# Patient Record
Sex: Female | Born: 1985 | Race: Black or African American | Hispanic: No | Marital: Single | Smoking: Never smoker
Health system: Southern US, Community
[De-identification: ages and names within clinical notes are randomized; demographics above are authoritative.]

## PROBLEM LIST (undated history)

## (undated) ENCOUNTER — Inpatient Hospital Stay (HOSPITAL_COMMUNITY): Payer: BC Managed Care – PPO

## (undated) ENCOUNTER — Inpatient Hospital Stay (HOSPITAL_COMMUNITY): Admission: RE | Payer: Self-pay | Source: Ambulatory Visit

## (undated) DIAGNOSIS — K648 Other hemorrhoids: Secondary | ICD-10-CM

## (undated) DIAGNOSIS — J45909 Unspecified asthma, uncomplicated: Secondary | ICD-10-CM

## (undated) DIAGNOSIS — O368130 Decreased fetal movements, third trimester, not applicable or unspecified: Secondary | ICD-10-CM

## (undated) DIAGNOSIS — E785 Hyperlipidemia, unspecified: Secondary | ICD-10-CM

## (undated) DIAGNOSIS — E119 Type 2 diabetes mellitus without complications: Secondary | ICD-10-CM

## (undated) DIAGNOSIS — G935 Compression of brain: Secondary | ICD-10-CM

## (undated) DIAGNOSIS — F909 Attention-deficit hyperactivity disorder, unspecified type: Secondary | ICD-10-CM

## (undated) DIAGNOSIS — R87629 Unspecified abnormal cytological findings in specimens from vagina: Secondary | ICD-10-CM

## (undated) DIAGNOSIS — Z789 Other specified health status: Secondary | ICD-10-CM

## (undated) DIAGNOSIS — K589 Irritable bowel syndrome without diarrhea: Secondary | ICD-10-CM

## (undated) HISTORY — DX: Compression of brain: G93.5

## (undated) HISTORY — DX: Irritable bowel syndrome, unspecified: K58.9

## (undated) HISTORY — DX: Other hemorrhoids: K64.8

## (undated) HISTORY — PX: WISDOM TOOTH EXTRACTION: SHX21

## (undated) HISTORY — DX: Attention-deficit hyperactivity disorder, unspecified type: F90.9

---

## 2006-11-25 ENCOUNTER — Emergency Department (HOSPITAL_COMMUNITY): Admission: EM | Admit: 2006-11-25 | Discharge: 2006-11-26 | Payer: Self-pay | Admitting: Emergency Medicine

## 2007-05-20 ENCOUNTER — Emergency Department (HOSPITAL_COMMUNITY): Admission: EM | Admit: 2007-05-20 | Discharge: 2007-05-20 | Payer: Self-pay | Admitting: Emergency Medicine

## 2007-05-22 ENCOUNTER — Emergency Department (HOSPITAL_COMMUNITY): Admission: EM | Admit: 2007-05-22 | Discharge: 2007-05-22 | Payer: Self-pay | Admitting: Emergency Medicine

## 2007-05-30 ENCOUNTER — Emergency Department (HOSPITAL_COMMUNITY): Admission: EM | Admit: 2007-05-30 | Discharge: 2007-05-30 | Payer: Self-pay | Admitting: Emergency Medicine

## 2007-09-11 ENCOUNTER — Emergency Department (HOSPITAL_COMMUNITY): Admission: EM | Admit: 2007-09-11 | Discharge: 2007-09-11 | Payer: Self-pay | Admitting: Family Medicine

## 2007-12-02 ENCOUNTER — Emergency Department (HOSPITAL_COMMUNITY): Admission: EM | Admit: 2007-12-02 | Discharge: 2007-12-02 | Payer: Self-pay | Admitting: Family Medicine

## 2008-02-08 ENCOUNTER — Encounter: Admission: RE | Admit: 2008-02-08 | Discharge: 2008-02-08 | Payer: Self-pay | Admitting: Family Medicine

## 2008-02-08 ENCOUNTER — Other Ambulatory Visit: Admission: RE | Admit: 2008-02-08 | Discharge: 2008-02-08 | Payer: Self-pay | Admitting: Family Medicine

## 2008-04-05 ENCOUNTER — Emergency Department (HOSPITAL_COMMUNITY): Admission: EM | Admit: 2008-04-05 | Discharge: 2008-04-05 | Payer: Self-pay | Admitting: Family Medicine

## 2008-04-13 ENCOUNTER — Emergency Department (HOSPITAL_COMMUNITY): Admission: EM | Admit: 2008-04-13 | Discharge: 2008-04-13 | Payer: Self-pay | Admitting: Family Medicine

## 2009-01-20 ENCOUNTER — Emergency Department (HOSPITAL_COMMUNITY): Admission: EM | Admit: 2009-01-20 | Discharge: 2009-01-20 | Payer: Self-pay | Admitting: Emergency Medicine

## 2009-03-31 ENCOUNTER — Emergency Department: Payer: Self-pay | Admitting: Emergency Medicine

## 2010-06-28 ENCOUNTER — Emergency Department (HOSPITAL_COMMUNITY): Admission: EM | Admit: 2010-06-28 | Discharge: 2010-06-29 | Payer: Self-pay | Admitting: Emergency Medicine

## 2011-02-09 ENCOUNTER — Inpatient Hospital Stay (INDEPENDENT_AMBULATORY_CARE_PROVIDER_SITE_OTHER)
Admission: RE | Admit: 2011-02-09 | Discharge: 2011-02-09 | Disposition: A | Discharge status: Home / Self Care | Payer: BC Managed Care – PPO | Source: Ambulatory Visit | Attending: Family Medicine | Admitting: Family Medicine

## 2011-02-09 DIAGNOSIS — N949 Unspecified condition associated with female genital organs and menstrual cycle: Secondary | ICD-10-CM

## 2011-02-09 DIAGNOSIS — N760 Acute vaginitis: Secondary | ICD-10-CM

## 2011-02-09 DIAGNOSIS — A499 Bacterial infection, unspecified: Secondary | ICD-10-CM

## 2011-02-09 LAB — CBC
Hemoglobin: 11.9 g/dL — ABNORMAL LOW (ref 12.0–15.0)
MCH: 28.5 pg (ref 26.0–34.0)
MCV: 85.6 fL (ref 78.0–100.0)
Platelets: 277 10*3/uL (ref 150–400)
RBC: 4.17 MIL/uL (ref 3.87–5.11)
WBC: 10.5 10*3/uL (ref 4.0–10.5)

## 2011-02-09 LAB — DIFFERENTIAL
Eosinophils Absolute: 0.4 10*3/uL (ref 0.0–0.7)
Lymphocytes Relative: 22 % (ref 12–46)
Lymphs Abs: 2.3 10*3/uL (ref 0.7–4.0)
Monocytes Relative: 8 % (ref 3–12)
Neutro Abs: 7 10*3/uL (ref 1.7–7.7)
Neutrophils Relative %: 66 % (ref 43–77)

## 2011-02-09 LAB — POCT URINALYSIS DIP (DEVICE)
Glucose, UA: NEGATIVE mg/dL
Hgb urine dipstick: NEGATIVE
Nitrite: NEGATIVE
Protein, ur: NEGATIVE mg/dL
Specific Gravity, Urine: 1.02 (ref 1.005–1.030)
Urobilinogen, UA: 0.2 mg/dL (ref 0.0–1.0)
pH: 7 (ref 5.0–8.0)

## 2011-02-09 LAB — WET PREP, GENITAL: WBC, Wet Prep HPF POC: NONE SEEN

## 2011-03-19 ENCOUNTER — Encounter (HOSPITAL_COMMUNITY): Payer: Self-pay

## 2011-03-19 ENCOUNTER — Encounter (HOSPITAL_COMMUNITY)
Admission: RE | Admit: 2011-03-19 | Discharge: 2011-03-19 | Disposition: A | Discharge status: Home / Self Care | Payer: BC Managed Care – PPO | Source: Ambulatory Visit | Attending: Obstetrics and Gynecology | Admitting: Obstetrics and Gynecology

## 2011-03-19 HISTORY — DX: Other specified health status: Z78.9

## 2011-03-19 LAB — SURGICAL PCR SCREEN
MRSA, PCR: INVALID — AB
Staphylococcus aureus: INVALID — AB

## 2011-03-19 NOTE — Anesthesia Preprocedure Evaluation (Signed)
Anesthesia Evaluation  Name, MR# and DOB Patient awake  General Assessment Comment  Reviewed: Allergy & Precautions, H&P , NPO status , Patient's Chart, lab work & pertinent test results, reviewed documented beta blocker date and time   Airway Mallampati: I TM Distance: >3 FB Neck ROM: full    Dental No notable dental hx. (+) Teeth Intact   Pulmonary  clear to auscultation  pulmonary exam normalPulmonary Exam Normal breath sounds clear to auscultation none    Cardiovascular regular Normal    Neuro/Psych Negative Neurological ROS  Negative Psych ROS  GI/Hepatic/Renal negative GI ROS, negative Liver ROS, and negative Renal ROS (+)       Endo/Other  Negative Endocrine ROS (+)      Abdominal Normal abdominal exam  (+)   Musculoskeletal negative musculoskeletal ROS (+)   Hematology negative hematology ROS (+)   Peds  Reproductive/Obstetrics negative OB ROS    Anesthesia Other Findings             Anesthesia Physical Anesthesia Plan  ASA: II  Anesthesia Plan: General   Post-op Pain Management:    Induction: Intravenous  Airway Management Planned: Oral ETT  Additional Equipment:   Intra-op Plan:   Post-operative Plan:   Informed Consent: I have reviewed the patients History and Physical, chart, labs and discussed the procedure including the risks, benefits and alternatives for the proposed anesthesia with the patient or authorized representative who has indicated his/her understanding and acceptance.   Dental Advisory Given and History available from chart only  Plan Discussed with: CRNA  Anesthesia Plan Comments:         Anesthesia Quick Evaluation

## 2011-03-19 NOTE — Patient Instructions (Addendum)
20 Brenda Valdez  03/19/2011   Your procedure is scheduled on:  03/24/11  Enter through the Main Entrance of [facility] at 6 AM.  Pick up the phone at the desk and dial [phone].   Call this number if you have problems the morning of surgery: [phone]   Remember:   Do not eat food:After Midnight.  Do not drink clear liquids: After Midnight.  Take these medicines the morning of surgery with A SIP OF WATER:NA   Do not wear jewelry, make-up or nail polish.  Do not wear lotions, powders, or perfumes. You may wear deodorant.  Do not shave 48 hours prior to surgery.  Do not bring valuables to the hospital.  Contacts, dentures or bridgework may not be worn into surgery.  Leave suitcase in the car. After surgery it may be brought to your room.  For patients admitted to the hospital, checkout time is 11:00 AM the day of discharge.   Patients discharged the day of surgery will not be allowed to drive home.  Name and phone number of your driver: NA  Special Instructions: CHG Shower Use Special Wash: 1/2 bottle night before surgery and 1/2 bottle morning of surgery.   Please read over the following fact sheets that you were given: MRSA Information

## 2011-03-19 NOTE — H&P (Signed)
  Brenda Valdez  DICTATION # [phone] CSN# [id number]   Meriel Pica, MD 03/19/2011 3:15 PM

## 2011-03-20 NOTE — H&P (Signed)
NAMECEIRA, HOESCHEN NO.:  [id number]  MEDICAL RECORD NO.:  [id number]  LOCATION:  [facility]                           FACILITY:  [facility]  PHYSICIAN:  Duke Salvia. Marcelle Overlie, M.D.DATE OF BIRTH:  [birth year]  DATE OF ADMISSION:  03/19/2011 DATE OF DISCHARGE:                             HISTORY & PHYSICAL   Date of scheduled surgery is March 24, 2011.  CHIEF COMPLAINT:  Pelvic pain, dysmenorrhea.  HISTORY OF PRESENT ILLNESS:  A 25 year old G0, P0, currently using condoms.  This patient has been evaluated recently in Urgent Care and also by her PCP for dysmenorrhea and pelvic pain.  She did have ultrasound ordered by her PCP done at [facility], February 12, 2011, that showed normal pelvic ultrasound.  Her exam when I saw her was likewise normal.  We discussed the possibility of endometriosis.  She had not responded well previously to ovulation suppression with OCPs.  I offered her a trial of Lupron which she declines.  She prefers to proceed with diagnostic laparoscopy to further evaluate her pelvic pain.  This procedure including risks related to bleeding, infection, adjacent organ injury, possible need for open additional surgery were all reviewed with her, which she understands and accepts.  ALLERGIES:  None.  CURRENT MEDICATIONS: 1. Doxycycline. 2. Valtrex. 3. Advil p.r.n. pain.  REVIEW OF SYSTEMS:  Significant for history of mild asthma, not currently on medications, prior history of herpes, UTI.  FAMILY HISTORY:  Significant for asthma, osteoporosis, diverticulosis, arthritis, diabetes, hypertension, and pancreatic, throat and lung cancer in unspecified relatives  PAST SURGERIES:  None.  SOCIAL HISTORY:  Denies tobacco or drug use.  She does say yes to alcohol use, amount not specified.  Dr. Duanne Guess at [facility] is her PCP.  PHYSICAL EXAMINATION:  VITAL SIGNS:  Temperature 98.2, blood pressure 120/72. HEENT: Unremarkable. NECK:  Supple without  mass. LUNGS:  Clear. CARDIOVASCULAR:  Rate and rhythm without murmurs, rubs, gallops. BREASTS:  Without masses. ABDOMEN:  Soft, flat, nontender. PELVIC:  Normal external genitalia.  Vagina and cervix clear.  Uterus mid position, nontender.  Adnexa unremarkable.  No unusual nodularity or tenderness. EXTREMITIES/NEUROLOGIC:  Unremarkable.  IMPRESSION:  Severe dysmenorrhea.  PLAN:  Diagnostic laparoscopy to rule out endometriosis or other causes. Procedural risks reviewed as above.     Xeng Kucher M. Marcelle Overlie, M.D.     RM[facility]/MEDQ  D:  03/19/2011  T:  03/20/2011  Job:  [id number]

## 2011-03-22 LAB — MRSA CULTURE

## 2011-03-24 ENCOUNTER — Ambulatory Visit (HOSPITAL_COMMUNITY)
Admission: RE | Admit: 2011-03-24 | Discharge: 2011-03-24 | Disposition: A | Discharge status: Home / Self Care | Payer: BC Managed Care – PPO | Source: Ambulatory Visit | Attending: Obstetrics and Gynecology | Admitting: Obstetrics and Gynecology

## 2011-03-24 ENCOUNTER — Encounter (HOSPITAL_COMMUNITY): Payer: Self-pay | Admitting: Anesthesiology

## 2011-03-24 ENCOUNTER — Encounter (HOSPITAL_COMMUNITY)
Admission: RE | Disposition: A | Discharge status: Home / Self Care | Payer: Self-pay | Source: Ambulatory Visit | Attending: Obstetrics and Gynecology

## 2011-03-24 ENCOUNTER — Ambulatory Visit (HOSPITAL_COMMUNITY): Payer: BC Managed Care – PPO | Admitting: Anesthesiology

## 2011-03-24 DIAGNOSIS — N949 Unspecified condition associated with female genital organs and menstrual cycle: Secondary | ICD-10-CM | POA: Insufficient documentation

## 2011-03-24 HISTORY — PX: LAPAROSCOPY: SHX197

## 2011-03-24 LAB — SURGICAL PCR SCREEN: MRSA, PCR: NEGATIVE

## 2011-03-24 LAB — CBC
Hemoglobin: 11.6 g/dL — ABNORMAL LOW (ref 12.0–15.0)
MCV: 87.5 fL (ref 78.0–100.0)
Platelets: 244 10*3/uL (ref 150–400)
RBC: 4.08 MIL/uL (ref 3.87–5.11)
WBC: 7.3 10*3/uL (ref 4.0–10.5)

## 2011-03-24 SURGERY — LAPAROSCOPY, DIAGNOSTIC
Anesthesia: General | Site: Abdomen | Wound class: Clean Contaminated

## 2011-03-24 MED ORDER — BUPIVACAINE HCL (PF) 0.25 % IJ SOLN
INTRAMUSCULAR | Status: DC | PRN
  Administered 2011-03-24: 9 mL

## 2011-03-24 MED ORDER — FENTANYL CITRATE 0.05 MG/ML IJ SOLN
INTRAMUSCULAR | Status: AC
  Filled 2011-03-24: qty 5

## 2011-03-24 MED ORDER — KETOROLAC TROMETHAMINE 30 MG/ML IJ SOLN
INTRAMUSCULAR | Status: DC | PRN
  Administered 2011-03-24: 30 mg via INTRAVENOUS

## 2011-03-24 MED ORDER — HYDROCODONE-ACETAMINOPHEN 5-325 MG PO TABS
ORAL_TABLET | ORAL | Status: AC
  Filled 2011-03-24: qty 1

## 2011-03-24 MED ORDER — ONDANSETRON HCL 4 MG/2ML IJ SOLN
INTRAMUSCULAR | Status: DC | PRN
  Administered 2011-03-24: 4 mg via INTRAVENOUS

## 2011-03-24 MED ORDER — METOCLOPRAMIDE HCL 5 MG/ML IJ SOLN: 10.0000 mg | Freq: Once | INTRAMUSCULAR | Status: DC | PRN

## 2011-03-24 MED ORDER — NEOSTIGMINE METHYLSULFATE 1 MG/ML IJ SOLN
INTRAMUSCULAR | Status: AC
  Filled 2011-03-24: qty 10

## 2011-03-24 MED ORDER — LIDOCAINE HCL (CARDIAC) 20 MG/ML IV SOLN
INTRAVENOUS | Status: DC | PRN
  Administered 2011-03-24: 60 mg via INTRAVENOUS

## 2011-03-24 MED ORDER — DEXTROSE IN LACTATED RINGERS 5 % IV SOLN: INTRAVENOUS | Status: DC

## 2011-03-24 MED ORDER — LIDOCAINE HCL (CARDIAC) 20 MG/ML IV SOLN
INTRAVENOUS | Status: AC
  Filled 2011-03-24: qty 5

## 2011-03-24 MED ORDER — MIDAZOLAM HCL 5 MG/5ML IJ SOLN
INTRAMUSCULAR | Status: DC | PRN
  Administered 2011-03-24: 2 mg via INTRAVENOUS

## 2011-03-24 MED ORDER — DEXAMETHASONE SODIUM PHOSPHATE 10 MG/ML IJ SOLN
INTRAMUSCULAR | Status: AC
  Filled 2011-03-24: qty 1

## 2011-03-24 MED ORDER — FENTANYL CITRATE 0.05 MG/ML IJ SOLN: 25.0000 ug | INTRAMUSCULAR | Status: DC | PRN

## 2011-03-24 MED ORDER — GLYCOPYRROLATE 0.2 MG/ML IJ SOLN
INTRAMUSCULAR | Status: DC | PRN
  Administered 2011-03-24: .6 mg via INTRAVENOUS

## 2011-03-24 MED ORDER — HYDROCODONE-ACETAMINOPHEN 5-325 MG PO TABS
1.0000 | ORAL_TABLET | Freq: Once | ORAL | Status: AC
  Administered 2011-03-24: 1 via ORAL

## 2011-03-24 MED ORDER — NEOSTIGMINE METHYLSULFATE 1 MG/ML IJ SOLN
INTRAMUSCULAR | Status: DC | PRN
  Administered 2011-03-24: 3 mg via INTRAMUSCULAR

## 2011-03-24 MED ORDER — FENTANYL CITRATE 0.05 MG/ML IJ SOLN
INTRAMUSCULAR | Status: DC | PRN
  Administered 2011-03-24 (×2): 100 ug via INTRAVENOUS
  Administered 2011-03-24: 50 ug via INTRAVENOUS

## 2011-03-24 MED ORDER — KETOROLAC TROMETHAMINE 30 MG/ML IJ SOLN
INTRAMUSCULAR | Status: AC
  Filled 2011-03-24: qty 1

## 2011-03-24 MED ORDER — ONDANSETRON HCL 4 MG/2ML IJ SOLN
INTRAMUSCULAR | Status: AC
  Filled 2011-03-24: qty 2

## 2011-03-24 MED ORDER — LACTATED RINGERS IV SOLN
INTRAVENOUS | Status: DC
  Administered 2011-03-24 (×2): via INTRAVENOUS

## 2011-03-24 MED ORDER — DEXAMETHASONE SODIUM PHOSPHATE 10 MG/ML IJ SOLN
INTRAMUSCULAR | Status: DC | PRN
  Administered 2011-03-24: 10 mg via INTRAVENOUS

## 2011-03-24 MED ORDER — PROPOFOL 10 MG/ML IV EMUL
INTRAVENOUS | Status: DC | PRN
  Administered 2011-03-24: 150 mg via INTRAVENOUS

## 2011-03-24 MED ORDER — MIDAZOLAM HCL 2 MG/2ML IJ SOLN
INTRAMUSCULAR | Status: AC
  Filled 2011-03-24: qty 2

## 2011-03-24 MED ORDER — ROCURONIUM BROMIDE 100 MG/10ML IV SOLN
INTRAVENOUS | Status: DC | PRN
  Administered 2011-03-24: 25 mg via INTRAVENOUS

## 2011-03-24 MED ORDER — PROPOFOL 10 MG/ML IV EMUL
INTRAVENOUS | Status: AC
  Filled 2011-03-24: qty 20

## 2011-03-24 MED ORDER — GLYCOPYRROLATE 0.2 MG/ML IJ SOLN
INTRAMUSCULAR | Status: AC
  Filled 2011-03-24: qty 2

## 2011-03-24 MED ORDER — ROCURONIUM BROMIDE 50 MG/5ML IV SOLN
INTRAVENOUS | Status: AC
  Filled 2011-03-24: qty 1

## 2011-03-24 SURGICAL SUPPLY — 16 items
CABLE HIGH FREQUENCY MONO STRZ (ELECTRODE) IMPLANT
CATH ROBINSON RED A/P 16FR (CATHETERS) ×2 IMPLANT
CLOTH BEACON ORANGE TIMEOUT ST (SAFETY) ×2 IMPLANT
DERMABOND ADVANCED (GAUZE/BANDAGES/DRESSINGS) ×2 IMPLANT
GLOVE BIO SURGEON STRL SZ7 (GLOVE) ×4 IMPLANT
GOWN PREVENTION PLUS LG XLONG (DISPOSABLE) ×4 IMPLANT
NEEDLE INSUFFLATION 14GA 120MM (NEEDLE) ×2 IMPLANT
NS IRRIG 1000ML POUR BTL (IV SOLUTION) ×2 IMPLANT
PACK LAPAROSCOPY BASIN (CUSTOM PROCEDURE TRAY) ×2 IMPLANT
SET IRRIG TUBING LAPAROSCOPIC (IRRIGATION / IRRIGATOR) IMPLANT
SUT VICRYL RAPIDE 4/0 PS 2 (SUTURE) ×4 IMPLANT
TOWEL OR 17X24 6PK STRL BLUE (TOWEL DISPOSABLE) ×4 IMPLANT
TROCAR Z-THREAD BLADED 11X100M (TROCAR) ×2 IMPLANT
TROCAR Z-THREAD BLADED 5X100MM (TROCAR) ×4 IMPLANT
WARMER LAPAROSCOPE (MISCELLANEOUS) ×2 IMPLANT
WATER STERILE IRR 1000ML POUR (IV SOLUTION) ×2 IMPLANT

## 2011-03-24 NOTE — Anesthesia Postprocedure Evaluation (Signed)
  Anesthesia Post-op Note  Patient: Brenda Valdez  Procedure(s) Performed:  LAPAROSCOPY DIAGNOSTIC  Patient Location: PACU  Anesthesia Type: General  Level of Consciousness: awake, alert  and oriented  Airway and Oxygen Therapy: Patient Spontanous Breathing  Post-op Pain: none  Post-op Assessment: Post-op Vital signs reviewed, Patient's Cardiovascular Status Stable, Respiratory Function Stable, Patent Airway, No signs of Nausea or vomiting and Pain level controlled  Post-op Vital Signs: Reviewed and stable  Complications: No apparent anesthesia complications

## 2011-03-24 NOTE — Op Note (Signed)
Preoperative diagnosis: Chronic pelvic pain  Postoperative diagnosis: Same  Procedure: Diagnostic laparoscopy  Surgeon: Marcelle Overlie  EBL: Minimal  Specimens removed: None  Complications: None  Procedure and findings:  Patient to the operating room after an adequate level of general anesthesia was obtained with the patient's legs in stirrups the abdomen perineum and vagina were prepped and draped in usual manner for diagnostic laparoscopy. The bladder was drained he UA carried out uterus mid position normal size mobile adnexa negative. A Conn cannula was positioned on the uterus. Attention directed to the abdomen where the subumbilical area was infiltrated with quarter percent Marcaine plain small incision was made in the varies needle was introduced without difficulty. Its intra-abdominal position was verified by pressure water testing. After 2-1/2 L pneumoperitoneum syncopated lap scopic trocar and sleeve were then introduced without difficulty. There is no evidence of any bleeding or trauma 3 finger breaths above the symphysis in the midline a 5 mm trocar was inserted under direct visualization. With the patient in Trendelenburg and the uterus anteflex, the pelvic findings as follows  The anterior posterior cul-de-sac were unremarkable the uterus itself was normal size serosa. The normal bilateral adnexa unremarkable pelvic sidewalls were normal careful inspection for early stage endometriosis was negative. Course of the ureter on each side was unremarkable. Upper abdomen was normal the appendix could be well visualized did have some periappendiceal adhesions but no evidence of any inflammation. After these findings for further documented in sutures removed gas allowed to escape because closed with 4 Dexon subcuticular at the umbilicus and Dermabond on the lower incision she tolerated this well went to recovery room in good condition.  Dictated with [facility], not proofread Jonavon Trieu M. Milana Obey.D.

## 2011-03-24 NOTE — Progress Notes (Signed)
No change in status.

## 2011-03-24 NOTE — Transfer of Care (Signed)
Immediate Anesthesia Transfer of Care Note  Patient: Brenda Valdez  Procedure(s) Performed:  LAPAROSCOPY DIAGNOSTIC  Patient Location: PACU  Anesthesia Type: General  Level of Consciousness: awake, alert  and oriented  Airway & Oxygen Therapy: Patient Spontanous Breathing and Patient connected to nasal cannula oxygen  Post-op Assessment: Report given to PACU RN, Post -op Vital signs reviewed and stable and Patient moving all extremities  Post vital signs: Reviewed and stable  Complications: No apparent anesthesia complications

## 2011-04-13 ENCOUNTER — Encounter (HOSPITAL_COMMUNITY): Payer: Self-pay | Admitting: Obstetrics and Gynecology

## 2011-04-24 LAB — POCT URINALYSIS DIP (DEVICE)
Glucose, UA: NEGATIVE
Nitrite: NEGATIVE
Operator id: 235561
Urobilinogen, UA: 0.2

## 2011-04-24 LAB — I-STAT 8, (EC8 V) (CONVERTED LAB)
Bicarbonate: 25.3 — ABNORMAL HIGH
HCT: 41
Hemoglobin: 13.9
Operator id: 235561
Sodium: 139
TCO2: 27

## 2011-04-24 LAB — POCT PREGNANCY, URINE: Preg Test, Ur: NEGATIVE

## 2011-05-13 LAB — INFLUENZA A AND B ANTIGEN (CONVERTED LAB)
Inflenza A Ag: NEGATIVE
Influenza B Ag: NEGATIVE

## 2011-05-13 LAB — CULTURE, ROUTINE-ABSCESS

## 2012-08-06 ENCOUNTER — Encounter (HOSPITAL_COMMUNITY): Payer: Self-pay | Admitting: Pharmacy Technician

## 2012-08-09 ENCOUNTER — Other Ambulatory Visit (HOSPITAL_COMMUNITY): Payer: Self-pay | Admitting: Obstetrics and Gynecology

## 2012-08-09 MED ORDER — ACETAMINOPHEN 10 MG/ML IV SOLN: 1000.0000 mg | Freq: Once | INTRAVENOUS | Status: AC

## 2012-08-09 NOTE — H&P (Signed)
Brenda Valdez  DICTATION # [id number] CSN# [id number]   Meriel Pica, MD 08/09/2012 9:23 AM

## 2012-08-10 NOTE — H&P (Signed)
Brenda Valdez, RIDER NO.:  [id number]  MEDICAL RECORD NO.:  [id number]  LOCATION:  [facility]IO                         FACILITY:  [facility]  PHYSICIAN:  Duke Salvia. Marcelle Overlie, M.D.DATE OF BIRTH:  [birth year]  DATE OF ADMISSION:  08/11/2012 DATE OF DISCHARGE:                             HISTORY & PHYSICAL   CHIEF COMPLAINT:  Cervical dysplasia.  HISTORY OF PRESENT ILLNESS:  A 27 year old G0 P0, currently using the NuvaRing for contraception.  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  Pap 06/24/2012, showed high grade dysplasia encompassing moderate and severe dysplasia.  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  Associated chronic cervicitis.  Because of her young age, we discussed observation for CIN 2 knowing that some of those will regress versus loop excision.  She is opted for loop excision, perforating the on patient's setting for her comfort.  This procedure including specific risks related to bleeding, infection, the need for follow up. Implications on future fertility reviewed with her which she understands and accepts.  PAST MEDICAL HISTORY:  She has had a diagnostic laparoscopy 2012 for pelvic pain with normal findings.  CURRENT MEDICATIONS:  NuvaRing.  REVIEW OF SYSTEMS:  Significant for history of abnormal Pap.  She has had all 3 of her Gardasil series.  History of mild asthma, anemia.  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 Valtrex and UTI.  FAMILY HISTORY:  Significant for osteoporosis, diverticulosis, arthritis, diabetes, hypertension, and a family history of both road and pancreatic cancer.  SOCIAL HISTORY:  She is single.  Denies tobacco or drug use.  She does admit to social alcohol use.  [facility] is her PCP.  PHYSICAL EXAMINATION:  VITAL SIGNS:  Temp 98.2, blood pressure 120/72. HEENT:  Unremarkable. NECK:  Supple  without masses. LUNGS:  Clear. CARDIOVASCULAR:  Regular rate and rhythm without murmurs, rubs, or gallops noted. BREASTS:  Without masses. ABDOMEN:  Soft, flat, and nontender. PELVIC:  Normal external genitalia.  Vagina and cervix clear.  Uterus, mid positional size.  Adnexa negative. EXTREMITIES:  Unremarkable. NEUROLOGIC:  Unremarkable.  IMPRESSION:  Moderate dysplasia.  PLAN:  Loop excision.  Procedure and risks discussed as above.     Hurshel Bouillon M. Marcelle Overlie, M.D.     RM[facility]/MEDQ  D:  08/09/2012  T:  08/10/2012  Job:  [id number]

## 2012-08-11 ENCOUNTER — Ambulatory Visit (HOSPITAL_COMMUNITY)
Admission: RE | Admit: 2012-08-11 | Discharge: 2012-08-11 | Disposition: A | Discharge status: Home / Self Care | Payer: 59 | Source: Ambulatory Visit | Attending: Obstetrics and Gynecology | Admitting: Obstetrics and Gynecology

## 2012-08-11 ENCOUNTER — Encounter (HOSPITAL_COMMUNITY)
Admission: RE | Disposition: A | Discharge status: Home / Self Care | Payer: Self-pay | Source: Ambulatory Visit | Attending: Obstetrics and Gynecology

## 2012-08-11 ENCOUNTER — Ambulatory Visit (HOSPITAL_COMMUNITY): Payer: 59 | Admitting: Anesthesiology

## 2012-08-11 ENCOUNTER — Encounter (HOSPITAL_COMMUNITY): Payer: Self-pay | Admitting: Anesthesiology

## 2012-08-11 DIAGNOSIS — N871 Moderate cervical dysplasia: Secondary | ICD-10-CM | POA: Insufficient documentation

## 2012-08-11 HISTORY — PX: LEEP: SHX91

## 2012-08-11 LAB — CBC
Hemoglobin: 12 g/dL (ref 12.0–15.0)
MCH: 28.4 pg (ref 26.0–34.0)
MCHC: 32.8 g/dL (ref 30.0–36.0)
RDW: 12.4 % (ref 11.5–15.5)

## 2012-08-11 LAB — PREGNANCY, URINE: Preg Test, Ur: NEGATIVE

## 2012-08-11 SURGERY — LEEP (LOOP ELECTROSURGICAL EXCISION PROCEDURE)
Anesthesia: Monitor Anesthesia Care | Site: Vagina | Wound class: Clean Contaminated

## 2012-08-11 MED ORDER — DEXAMETHASONE SODIUM PHOSPHATE 10 MG/ML IJ SOLN
INTRAMUSCULAR | Status: AC
  Filled 2012-08-11: qty 1

## 2012-08-11 MED ORDER — MIDAZOLAM HCL 2 MG/2ML IJ SOLN: 0.5000 mg | Freq: Once | INTRAMUSCULAR | Status: DC | PRN

## 2012-08-11 MED ORDER — PROPOFOL 10 MG/ML IV EMUL
INTRAVENOUS | Status: DC | PRN
  Administered 2012-08-11: 200 ug/kg/min via INTRAVENOUS

## 2012-08-11 MED ORDER — MIDAZOLAM HCL 5 MG/5ML IJ SOLN
INTRAMUSCULAR | Status: DC | PRN
  Administered 2012-08-11: 2 mg via INTRAVENOUS

## 2012-08-11 MED ORDER — LIDOCAINE HCL (CARDIAC) 20 MG/ML IV SOLN
INTRAVENOUS | Status: AC
  Filled 2012-08-11: qty 5

## 2012-08-11 MED ORDER — MEPERIDINE HCL 25 MG/ML IJ SOLN: 6.2500 mg | INTRAMUSCULAR | Status: DC | PRN

## 2012-08-11 MED ORDER — FENTANYL CITRATE 0.05 MG/ML IJ SOLN
INTRAMUSCULAR | Status: DC | PRN
  Administered 2012-08-11: 50 ug via INTRAVENOUS

## 2012-08-11 MED ORDER — ONDANSETRON HCL 4 MG/2ML IJ SOLN
INTRAMUSCULAR | Status: DC | PRN
  Administered 2012-08-11: 4 mg via INTRAVENOUS

## 2012-08-11 MED ORDER — LACTATED RINGERS IV SOLN
INTRAVENOUS | Status: DC
  Administered 2012-08-11: 50 mL/h via INTRAVENOUS
  Administered 2012-08-11 (×2): via INTRAVENOUS

## 2012-08-11 MED ORDER — LIDOCAINE-EPINEPHRINE 1 %-1:100000 IJ SOLN
INTRAMUSCULAR | Status: DC | PRN
  Administered 2012-08-11: 5 mL

## 2012-08-11 MED ORDER — PROMETHAZINE HCL 25 MG/ML IJ SOLN: 6.2500 mg | INTRAMUSCULAR | Status: DC | PRN

## 2012-08-11 MED ORDER — KETOROLAC TROMETHAMINE 30 MG/ML IJ SOLN
INTRAMUSCULAR | Status: DC | PRN
  Administered 2012-08-11: 30 mg via INTRAVENOUS

## 2012-08-11 MED ORDER — ONDANSETRON HCL 4 MG/2ML IJ SOLN
INTRAMUSCULAR | Status: AC
  Filled 2012-08-11: qty 2

## 2012-08-11 MED ORDER — FENTANYL CITRATE 0.05 MG/ML IJ SOLN
INTRAMUSCULAR | Status: AC
  Filled 2012-08-11: qty 2

## 2012-08-11 MED ORDER — LIDOCAINE HCL (CARDIAC) 20 MG/ML IV SOLN
INTRAVENOUS | Status: DC | PRN
  Administered 2012-08-11: 20 mg via INTRAVENOUS

## 2012-08-11 MED ORDER — MIDAZOLAM HCL 2 MG/2ML IJ SOLN
INTRAMUSCULAR | Status: AC
  Filled 2012-08-11: qty 2

## 2012-08-11 MED ORDER — PROPOFOL 10 MG/ML IV EMUL
INTRAVENOUS | Status: AC
  Filled 2012-08-11: qty 20

## 2012-08-11 MED ORDER — KETOROLAC TROMETHAMINE 30 MG/ML IJ SOLN: 15.0000 mg | Freq: Once | INTRAMUSCULAR | Status: DC | PRN

## 2012-08-11 MED ORDER — KETOROLAC TROMETHAMINE 30 MG/ML IJ SOLN
INTRAMUSCULAR | Status: AC
  Filled 2012-08-11: qty 1

## 2012-08-11 MED ORDER — FERRIC SUBSULFATE SOLN
Status: DC | PRN
  Administered 2012-08-11: 1 via TOPICAL

## 2012-08-11 MED ORDER — FENTANYL CITRATE 0.05 MG/ML IJ SOLN: 25.0000 ug | INTRAMUSCULAR | Status: DC | PRN

## 2012-08-11 MED ORDER — ACETIC ACID 4% SOLUTION
Status: DC | PRN
  Administered 2012-08-11: 1 via TOPICAL

## 2012-08-11 SURGICAL SUPPLY — 27 items
APPLICATOR COTTON TIP 6IN STRL (MISCELLANEOUS) ×2 IMPLANT
CLOTH BEACON ORANGE TIMEOUT ST (SAFETY) ×2 IMPLANT
CONT SPECI 4OZ STER CLIK (MISCELLANEOUS) ×2 IMPLANT
DRESSING TELFA 8X3 (GAUZE/BANDAGES/DRESSINGS) ×2 IMPLANT
ELECT BALL LEEP 5MM RED (ELECTRODE) ×2 IMPLANT
ELECT LOOP LEEP RND 15X12 GRN (CUTTING LOOP) ×2
ELECT LOOP LEEP RND 20X12 WHT (CUTTING LOOP)
ELECT REM PT RETURN 9FT ADLT (ELECTROSURGICAL) ×2
ELECTRODE LOOP LP RND 15X12GRN (CUTTING LOOP) ×1 IMPLANT
ELECTRODE LOOP LP RND 20X12WHT (CUTTING LOOP) IMPLANT
ELECTRODE REM PT RTRN 9FT ADLT (ELECTROSURGICAL) ×1 IMPLANT
EVACUATOR PREFILTER SMOKE (MISCELLANEOUS) ×2 IMPLANT
GAUZE SPONGE 4X4 16PLY XRAY LF (GAUZE/BANDAGES/DRESSINGS) IMPLANT
GLOVE BIO SURGEON STRL SZ7 (GLOVE) ×4 IMPLANT
GOWN STRL REIN XL XLG (GOWN DISPOSABLE) ×6 IMPLANT
HOSE NS SMOKE EVAC 7/8 X6 (MISCELLANEOUS) ×2 IMPLANT
NEEDLE SPNL 22GX3.5 QUINCKE BK (NEEDLE) ×2 IMPLANT
NS IRRIG 1000ML POUR BTL (IV SOLUTION) IMPLANT
PAD OB MATERNITY 4.3X12.25 (PERSONAL CARE ITEMS) ×2 IMPLANT
PENCIL BUTTON HOLSTER BLD 10FT (ELECTRODE) ×2 IMPLANT
REDUCER FITTING SMOKE EVAC (MISCELLANEOUS) ×2 IMPLANT
SCOPETTES 8  STERILE (MISCELLANEOUS) ×1
SCOPETTES 8 STERILE (MISCELLANEOUS) ×1 IMPLANT
SYR CONTROL 10ML LL (SYRINGE) ×2 IMPLANT
TOWEL OR 17X24 6PK STRL BLUE (TOWEL DISPOSABLE) ×4 IMPLANT
TUBING SMOKE EVAC HOSE ADAPTER (MISCELLANEOUS) ×2 IMPLANT
WATER STERILE IRR 1000ML POUR (IV SOLUTION) ×2 IMPLANT

## 2012-08-11 NOTE — Anesthesia Preprocedure Evaluation (Addendum)
Anesthesia Evaluation  Patient identified by MRN, date of birth, ID band Patient awake    Reviewed: Allergy & Precautions, H&P , Patient's Chart, lab work & pertinent test results, reviewed documented beta blocker date and time   History of Anesthesia Complications Negative for: history of anesthetic complications  Airway Mallampati: II TM Distance: >3 FB Neck ROM: full    Dental No notable dental hx.    Pulmonary neg pulmonary ROS,  breath sounds clear to auscultation  Pulmonary exam normal       Cardiovascular Exercise Tolerance: Good negative cardio ROS  Rhythm:regular Rate:Normal     Neuro/Psych negative neurological ROS  negative psych ROS   GI/Hepatic negative GI ROS, Neg liver ROS,   Endo/Other  negative endocrine ROS  Renal/GU negative Renal ROS     Musculoskeletal   Abdominal   Peds  Hematology negative hematology ROS (+)   Anesthesia Other Findings   Reproductive/Obstetrics negative OB ROS                           Anesthesia Physical Anesthesia Plan  ASA: I  Anesthesia Plan: MAC   Post-op Pain Management:    Induction:   Airway Management Planned:   Additional Equipment:   Intra-op Plan:   Post-operative Plan:   Informed Consent: I have reviewed the patients History and Physical, chart, labs and discussed the procedure including the risks, benefits and alternatives for the proposed anesthesia with the patient or authorized representative who has indicated his/her understanding and acceptance.   Dental Advisory Given  Plan Discussed with: CRNA, Surgeon and Anesthesiologist  Anesthesia Plan Comments:       Anesthesia Quick Evaluation

## 2012-08-11 NOTE — Anesthesia Postprocedure Evaluation (Signed)
Anesthesia Post Note  Patient: Brenda Valdez  Procedure(s) Performed: Procedure(s) (LRB): LOOP ELECTROSURGICAL EXCISION PROCEDURE (LEEP) (N/A)  Anesthesia type: MAC  Patient location: PACU  Post pain: Pain level controlled  Post assessment: Post-op Vital signs reviewed  Last Vitals:  Filed Vitals:   08/11/12 1330  BP: 112/62  Pulse: 81  Temp: 37.1 C  Resp: 20    Post vital signs: Reviewed  Level of consciousness: sedated  Complications: No apparent anesthesia complications

## 2012-08-11 NOTE — Transfer of Care (Signed)
Immediate Anesthesia Transfer of Care Note  Patient: Brenda Valdez  Procedure(s) Performed: Procedure(s) (LRB) with comments: LOOP ELECTROSURGICAL EXCISION PROCEDURE (LEEP) (N/A)  Patient Location: PACU  Anesthesia Type:MAC  Level of Consciousness: awake  Airway & Oxygen Therapy: Patient Spontanous Breathing and Patient connected to nasal cannula oxygen  Post-op Assessment: Report given to PACU RN and Post -op Vital signs reviewed and stable  Post vital signs: stable  Complications: No apparent anesthesia complications

## 2012-08-11 NOTE — Progress Notes (Signed)
The patient was re-examined with no change in status 

## 2012-08-11 NOTE — Op Note (Signed)
Preoperative diagnosis: Moderate dysplasia  Postoperative diagnosis: Same  Procedure: Colposcopy, to pass loop excision procedure  Surgeon: Marcelle Overlie  Complications: None  Specimens removed ectocervical biopsies, LEEP specimen to pathology  EBL: Less than 10 cc  Procedure and findings  Patient taken the operating room after an adequate level of general mask anesthesia was obtained with the patient's legs in stirrups the vagina was prepped and draped in the usual fashion for vaginal procedures the colposcope was then brought into place, appropriate timeout for taken at that point. Repeat colposcopy revealed some acetowhite areas as noted previously on office colposcopy. Appropriate loop electrode was selected. 1% Xylocaine with epinephrine was then used intracervically in a circumferential manner. 2 pass LEEP was then performed of one half moon from the anterior cervix and one from the posterior cervix this is hemostatic ball tip electrode used to coagulate the base Monsel's applied she tolerated this well went to recovery room in good condition.  Dictated with [facility]  Brenda Valdez M. Brenda Valdez.D.

## 2012-08-12 ENCOUNTER — Encounter (HOSPITAL_COMMUNITY): Payer: Self-pay | Admitting: Obstetrics and Gynecology

## 2013-04-27 ENCOUNTER — Other Ambulatory Visit: Payer: Self-pay | Admitting: Obstetrics and Gynecology

## 2013-05-24 ENCOUNTER — Encounter (HOSPITAL_COMMUNITY): Payer: Self-pay | Admitting: *Deleted

## 2013-06-05 NOTE — H&P (Signed)
[name]  DICTATION # [phone] CSN# [id number]   Meriel Pica, MD 06/05/2013 1:33 PM

## 2013-06-06 NOTE — H&P (Signed)
[name], [name] NO.:  [id number]  MEDICAL RECORD NO.:  [id number]  LOCATION:                                 FACILITY:  PHYSICIAN:  Brenda Valdez. Brenda Valdez, Brenda ValdezDATE OF BIRTH:  06/25/1986  DATE OF ADMISSION: DATE OF DISCHARGE:                             HISTORY & PHYSICAL   CHIEF COMPLAINT:  Recurrent cervical/vaginal dysplasia.  HISTORY OF PRESENT ILLNESS:  A 27 year old, G0, P0, currently using the NuvaRing for contraception.  This patient underwent LEEP, January 2014. The pathology demonstrating cervix negative for dysplasia on the anterior lip, posterior lip showed negative for dysplasia with chronic inflammation.  Followup Pap on August 2014, demonstrated high-grade dysplasia, positive high-risk HPV.  Repeat colposcopy demonstrated some VAIN 1 on the posterior fornix area.  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  ECC was negative.  She presents now for laser ablation of the cervix and VAIN in the posterior fornix.  This procedure including specific risks related to bleeding, infection, the need for careful cytology followup, other complications, may require additional surgery discussed with her, which she understands and accepts.  PAST MEDICAL HISTORY:  Diagnostic laparoscopy in 2012 for pelvic pain with normal findings.  CURRENT MEDICATIONS:  NuvaRing.  REVIEW OF SYSTEMS:  Significant for abnormal cytology, also history of mild asthma and anemia and history of HSV for which she takes p.r.n. Valtrex.  FAMILY HISTORY:  Significant for osteoporosis, diverticulosis, arthritis, diabetes, hypertension, and pancreatic cancer.  SOCIAL HISTORY:  She is single.  Denies tobacco or drug use, drinks socially.  PHYSICAL EXAMINATION:  VITAL SIGNS:  Temperature 98.2, blood pressure 120/72. HEENT:  Unremarkable. NECK:  Supple without masses. LUNGS:  Clear. CARDIOVASCULAR:  Regular  rate and rhythm without murmurs, rubs, or gallops. BREASTS:  Without masses. ABDOMEN:  Soft, flat, nontender. GENITOURINARY:  Vulva, vagina, cervix normal.  Uterus mid position, normal size.  Adnexa negative. EXTREMITIES:  Unremarkable. NEUROLOGIC:  Unremarkable.  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  PLAN:  Laser ablation of CIN 1 and VAIN 1/2.  Procedure and risks discussed as above.     Brenda Valdez, M.D.     RM[facility]/MEDQ  D:  06/05/2013  T:  06/06/2013  Job:  [id number]

## 2013-06-16 ENCOUNTER — Encounter (HOSPITAL_COMMUNITY)
Admission: RE | Disposition: A | Discharge status: Home / Self Care | Payer: Self-pay | Source: Ambulatory Visit | Attending: Obstetrics and Gynecology

## 2013-06-16 ENCOUNTER — Ambulatory Visit (HOSPITAL_COMMUNITY)
Admission: RE | Admit: 2013-06-16 | Discharge: 2013-06-16 | Disposition: A | Discharge status: Home / Self Care | Payer: 59 | Source: Ambulatory Visit | Attending: Obstetrics and Gynecology | Admitting: Obstetrics and Gynecology

## 2013-06-16 ENCOUNTER — Encounter (HOSPITAL_COMMUNITY): Payer: Self-pay | Admitting: *Deleted

## 2013-06-16 ENCOUNTER — Encounter (HOSPITAL_COMMUNITY): Payer: 59 | Admitting: Anesthesiology

## 2013-06-16 ENCOUNTER — Ambulatory Visit (HOSPITAL_COMMUNITY): Payer: 59 | Admitting: Anesthesiology

## 2013-06-16 DIAGNOSIS — N893 Dysplasia of vagina, unspecified: Secondary | ICD-10-CM | POA: Insufficient documentation

## 2013-06-16 DIAGNOSIS — N870 Mild cervical dysplasia: Secondary | ICD-10-CM | POA: Insufficient documentation

## 2013-06-16 HISTORY — DX: Unspecified asthma, uncomplicated: J45.909

## 2013-06-16 HISTORY — PX: LASER ABLATION CONDOLAMATA: SHX5941

## 2013-06-16 LAB — CBC
Hemoglobin: 11.9 g/dL — ABNORMAL LOW (ref 12.0–15.0)
MCH: 27.2 pg (ref 26.0–34.0)
MCHC: 33.1 g/dL (ref 30.0–36.0)
MCV: 82.4 fL (ref 78.0–100.0)
Platelets: 264 10*3/uL (ref 150–400)
RDW: 13.5 % (ref 11.5–15.5)

## 2013-06-16 LAB — PREGNANCY, URINE: Preg Test, Ur: NEGATIVE

## 2013-06-16 SURGERY — ABLATION, CONDYLOMA, USING LASER
Anesthesia: General | Site: Vagina | Wound class: Clean Contaminated

## 2013-06-16 MED ORDER — CHLOROPROCAINE HCL 1 % IJ SOLN
INTRAMUSCULAR | Status: AC
  Filled 2013-06-16: qty 30

## 2013-06-16 MED ORDER — DOXYCYCLINE HYCLATE 50 MG PO CAPS: 100.0000 mg | ORAL_CAPSULE | Freq: Two times a day (BID) | ORAL | Status: DC

## 2013-06-16 MED ORDER — ONDANSETRON HCL 4 MG/2ML IJ SOLN
INTRAMUSCULAR | Status: AC
  Filled 2013-06-16: qty 2

## 2013-06-16 MED ORDER — FERRIC SUBSULFATE SOLN
Status: DC | PRN
  Administered 2013-06-16: 1

## 2013-06-16 MED ORDER — FERRIC SUBSULFATE 259 MG/GM EX SOLN
CUTANEOUS | Status: AC
  Filled 2013-06-16: qty 8

## 2013-06-16 MED ORDER — ACETIC ACID 5 % SOLN
Status: AC
  Filled 2013-06-16: qty 500

## 2013-06-16 MED ORDER — PROPOFOL 10 MG/ML IV BOLUS
INTRAVENOUS | Status: DC | PRN
  Administered 2013-06-16: 20 mg via INTRAVENOUS
  Administered 2013-06-16: 180 mg via INTRAVENOUS

## 2013-06-16 MED ORDER — IODINE STRONG (LUGOLS) 5 % PO SOLN
ORAL | Status: AC
  Filled 2013-06-16: qty 1

## 2013-06-16 MED ORDER — ACETIC ACID 4% SOLUTION
Status: DC | PRN
  Administered 2013-06-16: 1 via TOPICAL

## 2013-06-16 MED ORDER — FENTANYL CITRATE 0.05 MG/ML IJ SOLN
25.0000 ug | INTRAMUSCULAR | Status: DC | PRN
  Administered 2013-06-16 (×2): 50 ug via INTRAVENOUS

## 2013-06-16 MED ORDER — LIDOCAINE HCL (CARDIAC) 20 MG/ML IV SOLN
INTRAVENOUS | Status: AC
Start: 2013-06-16 — End: 2013-06-16
  Filled 2013-06-16: qty 5

## 2013-06-16 MED ORDER — IODINE STRONG (LUGOLS) 5 % PO SOLN
ORAL | Status: DC | PRN
  Administered 2013-06-16: 0.1 mL

## 2013-06-16 MED ORDER — MEPERIDINE HCL 25 MG/ML IJ SOLN: 6.2500 mg | INTRAMUSCULAR | Status: DC | PRN

## 2013-06-16 MED ORDER — METOCLOPRAMIDE HCL 5 MG/ML IJ SOLN: 10.0000 mg | Freq: Once | INTRAMUSCULAR | Status: DC | PRN

## 2013-06-16 MED ORDER — SILVER SULFADIAZINE 1 % EX CREA
TOPICAL_CREAM | CUTANEOUS | Status: AC
  Filled 2013-06-16: qty 50

## 2013-06-16 MED ORDER — LIDOCAINE HCL 1 % IJ SOLN
INTRAMUSCULAR | Status: AC
  Filled 2013-06-16: qty 20

## 2013-06-16 MED ORDER — KETOROLAC TROMETHAMINE 30 MG/ML IJ SOLN
INTRAMUSCULAR | Status: AC
  Filled 2013-06-16: qty 1

## 2013-06-16 MED ORDER — LACTATED RINGERS IV SOLN
INTRAVENOUS | Status: DC
  Administered 2013-06-16 (×2): via INTRAVENOUS

## 2013-06-16 MED ORDER — ONDANSETRON HCL 4 MG/2ML IJ SOLN
INTRAMUSCULAR | Status: DC | PRN
  Administered 2013-06-16: 4 mg via INTRAVENOUS

## 2013-06-16 MED ORDER — MIDAZOLAM HCL 2 MG/2ML IJ SOLN
INTRAMUSCULAR | Status: DC | PRN
  Administered 2013-06-16: 2 mg via INTRAVENOUS

## 2013-06-16 MED ORDER — DEXAMETHASONE SODIUM PHOSPHATE 10 MG/ML IJ SOLN
INTRAMUSCULAR | Status: DC | PRN
  Administered 2013-06-16: 10 mg via INTRAVENOUS

## 2013-06-16 MED ORDER — FENTANYL CITRATE 0.05 MG/ML IJ SOLN
INTRAMUSCULAR | Status: AC
  Administered 2013-06-16: 50 ug via INTRAVENOUS
  Filled 2013-06-16: qty 2

## 2013-06-16 MED ORDER — MIDAZOLAM HCL 2 MG/2ML IJ SOLN
INTRAMUSCULAR | Status: AC
  Filled 2013-06-16: qty 2

## 2013-06-16 MED ORDER — PROPOFOL 10 MG/ML IV EMUL
INTRAVENOUS | Status: AC
  Filled 2013-06-16: qty 20

## 2013-06-16 MED ORDER — FENTANYL CITRATE 0.05 MG/ML IJ SOLN
INTRAMUSCULAR | Status: DC | PRN
  Administered 2013-06-16: 100 ug via INTRAVENOUS
  Administered 2013-06-16: 50 ug via INTRAVENOUS

## 2013-06-16 MED ORDER — FENTANYL CITRATE 0.05 MG/ML IJ SOLN
INTRAMUSCULAR | Status: AC
  Filled 2013-06-16: qty 2

## 2013-06-16 MED ORDER — KETOROLAC TROMETHAMINE 30 MG/ML IJ SOLN
INTRAMUSCULAR | Status: DC | PRN
  Administered 2013-06-16: 30 mg via INTRAVENOUS

## 2013-06-16 MED ORDER — KETOROLAC TROMETHAMINE 30 MG/ML IJ SOLN: 15.0000 mg | Freq: Once | INTRAMUSCULAR | Status: DC | PRN

## 2013-06-16 SURGICAL SUPPLY — 19 items
APPLICATOR COTTON TIP 6IN STRL (MISCELLANEOUS) ×2 IMPLANT
CLOTH BEACON ORANGE TIMEOUT ST (SAFETY) ×2 IMPLANT
CONTAINER PREFILL 10% NBF 60ML (FORM) IMPLANT
DEPRESSOR TONGUE BLADE WOOD (MISCELLANEOUS) ×2 IMPLANT
EVACUATOR PREFILTER SMOKE (MISCELLANEOUS) ×2 IMPLANT
GAUZE SPONGE 4X4 16PLY XRAY LF (GAUZE/BANDAGES/DRESSINGS) IMPLANT
GLOVE BIO SURGEON STRL SZ7 (GLOVE) ×2 IMPLANT
GOWN PREVENTION PLUS LG XLONG (DISPOSABLE) ×4 IMPLANT
HOSE NS SMOKE EVAC 7/8 X6 (MISCELLANEOUS) ×2 IMPLANT
NEEDLE SPNL 22GX3.5 QUINCKE BK (NEEDLE) ×2 IMPLANT
NS IRRIG 1000ML POUR BTL (IV SOLUTION) ×2 IMPLANT
PACK VAGINAL MINOR WOMEN LF (CUSTOM PROCEDURE TRAY) ×2 IMPLANT
REDUCER FITTING SMOKE EVAC (MISCELLANEOUS) ×2 IMPLANT
SCOPETTES 8  STERILE (MISCELLANEOUS) ×2
SCOPETTES 8 STERILE (MISCELLANEOUS) ×2 IMPLANT
SYR CONTROL 10ML LL (SYRINGE) ×2 IMPLANT
TOWEL OR 17X24 6PK STRL BLUE (TOWEL DISPOSABLE) ×4 IMPLANT
TUBING SMOKE EVAC HOSE ADAPTER (MISCELLANEOUS) ×2 IMPLANT
WATER STERILE IRR 1000ML POUR (IV SOLUTION) ×2 IMPLANT

## 2013-06-16 NOTE — Anesthesia Postprocedure Evaluation (Signed)
  Anesthesia Post-op Note  Patient: Brenda Valdez  Procedure(s) Performed: Procedure(s) with comments:  CO2 LASER ABLATION CONDOLAMATA (N/A) - CO2 LASER ABLATION OF VAIN & CIN Patient is awake and responsive. Pain and nausea are reasonably well controlled. Vital signs are stable and clinically acceptable. Oxygen saturation is clinically acceptable. There are no apparent anesthetic complications at this time. Patient is ready for discharge.

## 2013-06-16 NOTE — Progress Notes (Signed)
The patient was re-examined with no change in status 

## 2013-06-16 NOTE — Op Note (Signed)
Preoperative diagnosis: 1 CIN-1  2. 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  EBL: Less than 10 cc  Specimens removed: None  Procedure and findings:  Patient taken the operating room after an adequate level of general anesthesia by LMA was obtained, appropriate timeout for taken, the vulva and vaginal areas were prepped and draped. Colposcope was articulated, dilute acetic acid was applied and repeat colposcopy was carried out verifying the office findings, acetowhite lesion into the posterior fornix previously biopsied this vaginal dysplasia. CO2 laser was articulated of the continuous setting of 15 W, the beam was defocused slightly, starting on the cervix the Chataignier J. was outlined with the laser and ablated down approximately 5 mm. The vaginal lesion was outlined and carefully ablated down to a depth of 2-3 mm. This was hemostatic careful colposcopy revealed no other acetowhite lesions of note. Monsel was applied, she received Toradol at the end of the case and went to recovery room in good condition.  Dictated with [facility]  Brenda Pate M. Brenda Valdez.D.

## 2013-06-16 NOTE — Transfer of Care (Signed)
Anesthesia Post Note  Patient: Brenda Valdez  Procedure(s) Performed: Procedure(s) (LRB):  CO2 LASER ABLATION CONDOLAMATA (N/A)  Anesthesia type: General  Patient location: Women's Unit  Post pain: Pain level controlled  Post assessment: Post-op Vital signs reviewed  Last Vitals:  Filed Vitals:   06/16/13 0630  BP: 106/75  Pulse: 92  Temp: 37 C  Resp: 20    Post vital signs: Reviewed  Level of consciousness: sedated  Complications: No apparent anesthesia complications

## 2013-06-16 NOTE — Anesthesia Preprocedure Evaluation (Signed)
Anesthesia Evaluation  Patient identified by MRN, date of birth, ID band Patient awake    Reviewed: Allergy & Precautions, H&P , NPO status , Patient's Chart, lab work & pertinent test results, reviewed documented beta blocker date and time   History of Anesthesia Complications Negative for: history of anesthetic complications  Airway Mallampati: III TM Distance: >3 FB Neck ROM: full    Dental  (+) Teeth Intact   Pulmonary asthma (last inhaler use 6 months ago, uses infrequently) ,  breath sounds clear to auscultation  Pulmonary exam normal       Cardiovascular Exercise Tolerance: Good negative cardio ROS  Rhythm:regular Rate:Normal     Neuro/Psych negative neurological ROS  negative psych ROS   GI/Hepatic negative GI ROS, Neg liver ROS,   Endo/Other  negative endocrine ROS  Renal/GU negative Renal ROS  Female GU complaint     Musculoskeletal   Abdominal   Peds  Hematology negative hematology ROS (+)   Anesthesia Other Findings   Reproductive/Obstetrics negative OB ROS                           Anesthesia Physical Anesthesia Plan  ASA: II  Anesthesia Plan: General LMA   Post-op Pain Management:    Induction:   Airway Management Planned:   Additional Equipment:   Intra-op Plan:   Post-operative Plan:   Informed Consent: I have reviewed the patients History and Physical, chart, labs and discussed the procedure including the risks, benefits and alternatives for the proposed anesthesia with the patient or authorized representative who has indicated his/her understanding and acceptance.   Dental Advisory Given  Plan Discussed with: CRNA and Surgeon  Anesthesia Plan Comments:         Anesthesia Quick Evaluation

## 2013-06-19 ENCOUNTER — Encounter (HOSPITAL_COMMUNITY): Payer: Self-pay | Admitting: Obstetrics and Gynecology

## 2014-05-18 LAB — OB RESULTS CONSOLE HIV ANTIBODY (ROUTINE TESTING): HIV: NONREACTIVE

## 2014-05-18 LAB — OB RESULTS CONSOLE HEPATITIS B SURFACE ANTIGEN: Hepatitis B Surface Ag: NEGATIVE

## 2014-05-18 LAB — OB RESULTS CONSOLE ABO/RH: RH Type: POSITIVE

## 2014-05-18 LAB — OB RESULTS CONSOLE ANTIBODY SCREEN: Antibody Screen: NEGATIVE

## 2014-05-18 LAB — OB RESULTS CONSOLE RPR: RPR: NONREACTIVE

## 2014-05-18 LAB — OB RESULTS CONSOLE RUBELLA ANTIBODY, IGM: RUBELLA: IMMUNE

## 2014-06-08 LAB — OB RESULTS CONSOLE GC/CHLAMYDIA
CHLAMYDIA, DNA PROBE: NEGATIVE
GC PROBE AMP, GENITAL: NEGATIVE

## 2014-09-03 ENCOUNTER — Inpatient Hospital Stay (HOSPITAL_COMMUNITY): Payer: 59

## 2014-09-03 ENCOUNTER — Encounter (HOSPITAL_COMMUNITY): Payer: Self-pay

## 2014-09-03 ENCOUNTER — Observation Stay (HOSPITAL_COMMUNITY)
Admission: AD | Admit: 2014-09-03 | Discharge: 2014-09-04 | Disposition: A | Discharge status: Home / Self Care | Payer: 59 | Source: Ambulatory Visit | Attending: Obstetrics and Gynecology | Admitting: Obstetrics and Gynecology

## 2014-09-03 DIAGNOSIS — J45990 Exercise induced bronchospasm: Secondary | ICD-10-CM | POA: Insufficient documentation

## 2014-09-03 DIAGNOSIS — Z79899 Other long term (current) drug therapy: Secondary | ICD-10-CM | POA: Diagnosis not present

## 2014-09-03 DIAGNOSIS — Z3492 Encounter for supervision of normal pregnancy, unspecified, second trimester: Secondary | ICD-10-CM

## 2014-09-03 DIAGNOSIS — D72829 Elevated white blood cell count, unspecified: Secondary | ICD-10-CM | POA: Insufficient documentation

## 2014-09-03 DIAGNOSIS — K429 Umbilical hernia without obstruction or gangrene: Secondary | ICD-10-CM | POA: Diagnosis not present

## 2014-09-03 DIAGNOSIS — O26899 Other specified pregnancy related conditions, unspecified trimester: Secondary | ICD-10-CM

## 2014-09-03 DIAGNOSIS — O26892 Other specified pregnancy related conditions, second trimester: Secondary | ICD-10-CM | POA: Diagnosis not present

## 2014-09-03 DIAGNOSIS — K439 Ventral hernia without obstruction or gangrene: Secondary | ICD-10-CM | POA: Diagnosis not present

## 2014-09-03 DIAGNOSIS — Z3A23 23 weeks gestation of pregnancy: Secondary | ICD-10-CM | POA: Insufficient documentation

## 2014-09-03 DIAGNOSIS — R109 Unspecified abdominal pain: Secondary | ICD-10-CM

## 2014-09-03 LAB — URINALYSIS, ROUTINE W REFLEX MICROSCOPIC
BILIRUBIN URINE: NEGATIVE
Glucose, UA: NEGATIVE mg/dL
Hgb urine dipstick: NEGATIVE
Ketones, ur: NEGATIVE mg/dL
Leukocytes, UA: NEGATIVE
NITRITE: NEGATIVE
PROTEIN: NEGATIVE mg/dL
SPECIFIC GRAVITY, URINE: 1.015 (ref 1.005–1.030)
Urobilinogen, UA: 0.2 mg/dL (ref 0.0–1.0)
pH: 7 (ref 5.0–8.0)

## 2014-09-03 LAB — COMPREHENSIVE METABOLIC PANEL
ALBUMIN: 3.4 g/dL — AB (ref 3.5–5.2)
ALT: 10 U/L (ref 0–35)
AST: 13 U/L (ref 0–37)
Alkaline Phosphatase: 62 U/L (ref 39–117)
Anion gap: 7 (ref 5–15)
BILIRUBIN TOTAL: 0.3 mg/dL (ref 0.3–1.2)
BUN: 8 mg/dL (ref 6–23)
CALCIUM: 8.7 mg/dL (ref 8.4–10.5)
CHLORIDE: 105 mmol/L (ref 96–112)
CO2: 20 mmol/L (ref 19–32)
Creatinine, Ser: 0.42 mg/dL — ABNORMAL LOW (ref 0.50–1.10)
GFR calc Af Amer: >90 mL/min (ref >90)
GFR calc non Af Amer: >90 mL/min (ref >90)
GLUCOSE: 87 mg/dL (ref 70–99)
Potassium: 3.7 mmol/L (ref 3.5–5.1)
SODIUM: 132 mmol/L — AB (ref 135–145)
Total Protein: 7.2 g/dL (ref 6.0–8.3)

## 2014-09-03 LAB — CBC
HCT: 32.1 % — ABNORMAL LOW (ref 36.0–46.0)
Hemoglobin: 10.6 g/dL — ABNORMAL LOW (ref 12.0–15.0)
MCH: 27.7 pg (ref 26.0–34.0)
MCHC: 33 g/dL (ref 30.0–36.0)
MCV: 84 fL (ref 78.0–100.0)
Platelets: 238 10*3/uL (ref 150–400)
RBC: 3.82 MIL/uL — ABNORMAL LOW (ref 3.87–5.11)
RDW: 13.9 % (ref 11.5–15.5)
WBC: 14.6 10*3/uL — ABNORMAL HIGH (ref 4.0–10.5)

## 2014-09-03 LAB — LIPASE, BLOOD: Lipase: 23 U/L (ref 11–59)

## 2014-09-03 LAB — AMYLASE: Amylase: 48 U/L (ref 0–105)

## 2014-09-03 MED ORDER — ALBUTEROL SULFATE (2.5 MG/3ML) 0.083% IN NEBU: 3.0000 mL | INHALATION_SOLUTION | Freq: Four times a day (QID) | RESPIRATORY_TRACT | Status: DC | PRN

## 2014-09-03 MED ORDER — PRENATAL MULTIVITAMIN CH
1.0000 | ORAL_TABLET | Freq: Every day | ORAL | Status: DC
  Administered 2014-09-04: 1 via ORAL
  Filled 2014-09-03: qty 1

## 2014-09-03 MED ORDER — CALCIUM CARBONATE ANTACID 500 MG PO CHEW: 2.0000 | CHEWABLE_TABLET | ORAL | Status: DC | PRN

## 2014-09-03 MED ORDER — ACETAMINOPHEN 325 MG PO TABS: 650.0000 mg | ORAL_TABLET | ORAL | Status: DC | PRN

## 2014-09-03 MED ORDER — ONDANSETRON HCL 4 MG/2ML IJ SOLN
4.0000 mg | Freq: Once | INTRAMUSCULAR | Status: AC
  Administered 2014-09-03: 4 mg via INTRAVENOUS
  Filled 2014-09-03: qty 2

## 2014-09-03 MED ORDER — LACTATED RINGERS IV SOLN: INTRAVENOUS | Status: DC

## 2014-09-03 MED ORDER — HYDROCODONE-ACETAMINOPHEN 5-325 MG PO TABS: 1.0000 | ORAL_TABLET | Freq: Four times a day (QID) | ORAL | Status: DC | PRN

## 2014-09-03 MED ORDER — ZOLPIDEM TARTRATE 5 MG PO TABS: 5.0000 mg | ORAL_TABLET | Freq: Every evening | ORAL | Status: DC | PRN

## 2014-09-03 MED ORDER — DOCUSATE SODIUM 100 MG PO CAPS
100.0000 mg | ORAL_CAPSULE | Freq: Every day | ORAL | Status: DC
  Administered 2014-09-04: 100 mg via ORAL
  Filled 2014-09-03: qty 1

## 2014-09-03 MED ORDER — MORPHINE SULFATE 4 MG/ML IJ SOLN
2.0000 mg | INTRAMUSCULAR | Status: DC | PRN
  Administered 2014-09-03 – 2014-09-04 (×2): 2 mg via INTRAVENOUS
  Filled 2014-09-03 (×2): qty 1

## 2014-09-03 MED ORDER — LACTATED RINGERS IV SOLN
INTRAVENOUS | Status: DC
Start: 2014-09-03 — End: 2014-09-03
  Administered 2014-09-03: 19:00:00 via INTRAVENOUS

## 2014-09-03 NOTE — MAU Provider Note (Signed)
History     CSN: [id number]  Arrival date and time: 09/03/14 1338 Provider notified of pt arrival @1500  Provider at bedside @1500      Chief Complaint  Patient presents with  . Abdominal Pain   HPI Comments: G2P0010 @23 .1 wks sent from office for eval of abd pain. Pt describes as abrupt onset at 0500 waking her from sleep. Intermittent sharp and achy worsening with movement. +nausea at time of pain onset, no vomiting. Last BM yesterday. Denies constipation or excess flatus. No fever. Tolerated partial chicken biscuit around 0800. Good FM. No VB, LOF, cramping or ctx. 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  Abdominal Pain Associated symptoms include nausea. Pertinent negatives include no constipation, diarrhea or fever.     Past Medical History  Diagnosis Date  . No pertinent past medical history   . Asthma     excercise induced    Past Surgical History  Procedure Laterality Date  . No past surgeries    . Laparoscopy  03/24/2011    Procedure: LAPAROSCOPY DIAGNOSTIC;  Surgeon: Meriel Picaichard M Holland;  Location: [facility];  Service: Gynecology;  Laterality: N/A;  . Leep  08/11/2012    Procedure: LOOP ELECTROSURGICAL EXCISION PROCEDURE (LEEP);  Surgeon: Meriel Picaichard M Holland, MD;  Location: [facility];  Service: Gynecology;  Laterality: N/A;  . Wisdom tooth extraction    . 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  History  Substance Use Topics  . Smoking status: Never Smoker   . Smokeless tobacco: Not on file  . Alcohol Use: Yes     Comment: socially    Allergies: No Known Allergies  Prescriptions prior to admission  Medication Sig Dispense Refill Last Dose  . albuterol (PROVENTIL HFA;VENTOLIN HFA) 108 (90 BASE) MCG/ACT inhaler Inhale 2 puffs into the lungs  every 6 (six) hours as needed. For wheezing     . doxycycline (VIBRAMYCIN) 50 MG capsule Take 2 capsules (100 mg total) by mouth 2 (two) times daily. (Patient not taking: Reported on 09/03/2014) 14 capsule 0   . Prenatal Vit-Fe Fumarate-FA (PRENATAL MULTIVITAMIN) TABS tablet Take 1 tablet by mouth daily at 12 noon.   09/02/2014 at Unknown time  . valACYclovir (VALTREX) 500 MG tablet Take 500 mg by mouth 2 (two) times daily as needed.    More than a month at Unknown time    Review of Systems  Constitutional: Negative.  Negative for fever.  HENT: Negative.   Eyes: Negative.   Respiratory: Negative.   Cardiovascular: Negative.   Gastrointestinal: Positive for nausea and abdominal pain. Negative for diarrhea and constipation.  Genitourinary: Negative.   Musculoskeletal: Negative.   Skin: Negative.   Neurological: Negative.   Endo/Heme/Allergies: Negative.   Psychiatric/Behavioral: Negative.    Physical Exam   Blood pressure 120/68, pulse 86, temperature 98.6 F (37 C), temperature source Oral, resp. rate 18, height 5\' 2"  (1.575 m), weight 88.905 kg (196 lb).  Physical Exam  Constitutional: She is oriented to person, place, and time. She appears well-developed and well-nourished.  HENT:  Head: Normocephalic and atraumatic.  Eyes: Pupils are equal, round, and reactive to light.  Neck: Normal range of motion. Neck supple.  Cardiovascular: Normal rate and regular rhythm.   Respiratory: Effort normal and breath  sounds normal.  GI: Soft. Bowel sounds are normal. She exhibits no distension and no mass. There is tenderness. There is no rebound and no guarding.  Gravid Diffuse tenderness all over abd Exquisitely tender LLQ  Genitourinary:  deferred  Musculoskeletal: Normal range of motion.  Neurological: She is alert and oriented to person, place, and time.  Skin: Skin is warm and dry.  Psychiatric: She has a normal mood and affect.  FHT: 159  MAU Course  Procedures UA-neg in office  today Results for Brenda Valdez, Brenda Valdez (MRN [id number]) as of 09/03/2014 21:51  Ref. Range 09/03/2014 15:02  Sodium Latest Range: 135-145 mmol/L 132 (L)  Potassium Latest Range: 3.5-5.1 mmol/L 3.7  Chloride Latest Range: 96-112 mmol/L 105  CO2 Latest Range: 19-32 mmol/L 20  BUN Latest Range: 6-23 mg/dL 8  Creatinine Latest Range: 0.50-1.10 mg/dL 4.09 (L)  Calcium Latest Range: 8.4-10.5 mg/dL 8.7  GFR calc non Af Amer Latest Range: >90 mL/min >90  GFR calc Af Amer Latest Range: >90 mL/min >90  Glucose Latest Range: 70-99 mg/dL 87  Anion gap Latest Range: 5-15  7  Alkaline Phosphatase Latest Range: 39-117 U/L 62  Albumin Latest Range: 3.5-5.2 g/dL 3.4 (L)  Amylase Latest Range: 0-105 U/L 48  Lipase Latest Range: 11-59 U/L 23  AST Latest Range: 0-37 U/L 13  ALT Latest Range: 0-35 U/L 10  Total Protein Latest Range: 6.0-8.3 g/dL 7.2  Total Bilirubin Latest Range: 0.3-1.2 mg/dL 0.3  WBC Latest Range: 4.0-10.5 K/uL 14.6 (H)  RBC Latest Range: 3.87-5.11 MIL/uL 3.82 (L)  Hemoglobin Latest Range: 12.0-15.0 g/dL 81.1 (L)  HCT Latest Range: 36.0-46.0 % 32.1 (L)  MCV Latest Range: 78.0-100.0 fL 84.0  MCH Latest Range: 26.0-34.0 pg 27.7  MCHC Latest Range: 30.0-36.0 g/dL 91.4  RDW Latest Range: 11.5-15.5 % 13.9  Platelets Latest Range: 150-400 K/uL 238  Results for Brenda Valdez, Brenda Valdez (MRN [id number]) as of 09/03/2014 21:51  Ref. Range 09/03/2014 14:00  Color, Urine Latest Range: YELLOW  YELLOW  APPearance Latest Range: CLEAR  HAZY (A)  Specific Gravity, Urine Latest Range: 1.005-1.030  1.015  pH Latest Range: 5.0-8.0  7.0  Glucose Latest Range: NEGATIVE mg/dL NEGATIVE  Bilirubin Urine Latest Range: NEGATIVE  NEGATIVE  Ketones, ur Latest Range: NEGATIVE mg/dL NEGATIVE  Protein Latest Range: NEGATIVE mg/dL NEGATIVE  Urobilinogen, UA Latest Range: 0.0-1.0 mg/dL 0.2  Nitrite Latest Range: NEGATIVE  NEGATIVE  Leukocytes, UA Latest Range: NEGATIVE  NEGATIVE  Hgb urine dipstick Latest Range: NEGATIVE  NEGATIVE    Abdominal sono- EXAM: ULTRASOUND ABDOMEN COMPLETE  COMPARISON: None.  FINDINGS: Gallbladder: No gallstones or wall thickening visualized. No sonographic Murphy sign noted.  Common bile duct: Diameter: 3.7 mm  Liver: No focal lesion identified. Within normal limits in parenchymal echogenicity.  IVC: No abnormality visualized.  Pancreas: Visualized portion unremarkable.  Spleen: Size and appearance within normal limits.  Right Kidney: Length: 11.9 cm. Echogenicity within normal limits. No mass or hydronephrosis visualized.  Left Kidney: Length: 11.1 cm. Echogenicity within normal limits. No mass or hydronephrosis visualized.  Abdominal aorta: No aneurysm visualized.  Other findings: None.  IMPRESSION: 1. Normal exam.  CT Abd/pelvis- CLINICAL DATA: Right lower quadrant pain elevated white blood cell count. Concern for appendicitis. Pregnant patient.  EXAM: CT ABDOMEN AND PELVIS WITHOUT CONTRAST  TECHNIQUE: Multidetector CT imaging of the abdomen and pelvis was performed following the standard protocol without IV contrast.  COMPARISON: Ultrasound 09/03/2014  FINDINGS: Lower chest: Lung bases are clear. No pericardial fluid.  Hepatobiliary: Non  IV contrast images demonstrate no focal hepatic lesion. Normal gallbladder.  Pancreas: Pancreas is normal. No ductal dilatation. No pancreatic inflammation.  Spleen: Normal spleen.  Adrenals/urinary tract: Adrenal glands and kidneys are normal. There is no hydronephrosis or hydroureter. No obstructing calculi in the ureters.  Stomach/Bowel: The stomach, small bowel, and cecum normal. The appendix extends medial from the cecum along the lateral margin of the gravid uterus (images 53 through 42 of series 2). The appendix is normal volume with no periappendiceal inflammation. There is gas within the tip which is thin wall. No evidence appendicitis.  The colon and rectosigmoid colon are  normal.  Vascular/Lymphatic: Abdominal aorta is normal caliber. There is no retroperitoneal or periportal lymphadenopathy. No pelvic lymphadenopathy.  Reproductive: Gravid uterus noted. The ovaries are not clearly identified.  Musculoskeletal: No aggressive osseous lesion.  Other: There is an umbilical hernia which is small. Superior to the umbilical hernia there is a small ventral hernia measuring 15 x 12 mm (image 83, series 603).  IMPRESSION: 1. No explanation for elevated white blood cell count. 2. Normal appendix. 3. Normal gallbladder and kidneys on this noncontrast exam. 4. Small umbilical hernia and midline ventral hernia just superior to the umbilical hernia.   1800-Pain increased, intermittent now, mostly on RLQ 1915-IVF and Zofran IV 2145-nausea improved, pain slightly improved, SVE 0/0/high, discussed results and plan for gen surgeon consult, IV Morphine prn 2200-Phone consult with gen surgeon Dr. Winn Jock of imaging and assessment, determined no acute surgical need at this time  Assessment and Plan  23.[redacted] weeks gestation Abdominal pain in pregnancy, unknown etiology-no evidence of gallbladder, pancreas or kidney disease, appendicitis, or PTL  Leukocytosis Ventral and umbilical hernia, small- unlikely etiology of pain  Admit to observation, analgesia prn, IV hydration, repeat CBC in am Consult with Dr. Cherly Hensen, agrees with A/P.   Denyse Amass, Desha Bitner, N 09/03/2014, 3:10 PM

## 2014-09-03 NOTE — H&P (Signed)
  OB ADMISSION/ HISTORY & PHYSICAL:  Admission Date: 09/03/2014  1:38 PM  Admit Diagnosis: 23.[redacted] weeks gestation, abdominal pain  Brenda Valdez is a 29 y.o. female G2P0010 @23 .1 wks sent from office for eval of abd pain. Pt describes as abrupt onset at 0500 waking her from sleep. Intermittent sharp and achy worsening with movement. +nausea at time of pain onset, no vomiting. No fever. Evaluation revealed neg complete abdominal sono, negative CT abd/pelvis except small umbilical and ventral hernia. Elevated WBC noted. No evidence of PTL or obstetric etiology.  Prenatal History: G2P0010   EDC: 12/30/2014  [facility]  Primary Ob Provider: Dr. Cherly Hensenousins Prenatal course complicated by hx LEEP, hx HSV with no recent outbreaks, and asthma.  Medical / Surgical History :  Past medical history:  Past Medical History  Diagnosis Date  . No pertinent past medical history   . Asthma     excercise induced     Past surgical history:  Past Surgical History  Procedure Laterality Date  . No past surgeries    . Laparoscopy  03/24/2011    Procedure: LAPAROSCOPY DIAGNOSTIC;  Surgeon: Meriel Picaichard M Holland;  Location: [facility];  Service: Gynecology;  Laterality: N/A;  . Leep  08/11/2012    Procedure: LOOP ELECTROSURGICAL EXCISION PROCEDURE (LEEP);  Surgeon: Meriel Picaichard M Holland, MD;  Location: [facility];  Service: Gynecology;  Laterality: N/A;  . Wisdom tooth extraction    . 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 No pertinent family history.   Social History:  reports that she has never smoked. She has never used smokeless tobacco. She reports that she does not drink alcohol or use illicit drugs.  Allergies: Review of patient's allergies indicates no known allergies.    Current Medications at time of admission:  Prior to Admission medications   Medication Sig Start Date End Date Taking? Authorizing Provider  albuterol (PROVENTIL HFA;VENTOLIN HFA) 108 (90 BASE) MCG/ACT inhaler Inhale 2 puffs into the lungs every 6 (six) hours as needed. For wheezing    Historical Provider, MD  Prenatal Vit-Fe Fumarate-FA (PRENATAL MULTIVITAMIN) TABS tablet Take 1 tablet by mouth daily at 12 noon.   Yes Historical Provider, MD  valACYclovir (VALTREX) 500 MG tablet Take 500 mg by mouth 2 (two) times daily as needed.     Historical Provider, MD     Review of Systems: +RLQ pain radiating to back +nausea +FM No LOF or VB No cramping or ctx  Physical Exam:  VS: Blood pressure 107/59, pulse 77, temperature 98.8 F (37.1 C), temperature source Oral, resp. rate 18, height 5\' 2"  (1.575 m), weight 88.905 kg (196 lb).  General: alert and oriented, appears mildly uncomfortable Heart: RRR Lungs: Clear lung fields Abdomen: Gravid, soft and diffusely tender all over, worse on RLQ, non-distended/ uterus non-tender Extremities: No edema Genitalia / VE:  0/0/high  Assessment: 23.[redacted] weeks gestation Abdominal pain Leukocytosis Umbilical and ventral hernia  Plan:  Admit to obs for IV hydration and pain management. Recheck CBC in am. Dr. Cherly Hensenousins notified of admission / plan of care /MD to manage   Lawernce PittsBHAMBRI, Deangela Randleman, N MSN, CNM 09/03/2014, 10:24 PM

## 2014-09-03 NOTE — MAU Note (Signed)
At 5am, woke up to sharp stabbing pain. Denies abnormal vaginal discharge or bleeding. Had nausea after pain began.

## 2014-09-03 NOTE — MAU Note (Signed)
Urine in lab 

## 2014-09-03 NOTE — MAU Note (Signed)
Pt presents complaining of right sided abdominal pain that started at 5am. Denies vaginal bleeding or discharge. Reports good fetal movement. Evauated in office this am

## 2014-09-04 LAB — CBC
HEMATOCRIT: 30.6 % — AB (ref 36.0–46.0)
Hemoglobin: 10 g/dL — ABNORMAL LOW (ref 12.0–15.0)
MCH: 27.6 pg (ref 26.0–34.0)
MCHC: 32.7 g/dL (ref 30.0–36.0)
MCV: 84.5 fL (ref 78.0–100.0)
Platelets: 211 10*3/uL (ref 150–400)
RBC: 3.62 MIL/uL — ABNORMAL LOW (ref 3.87–5.11)
RDW: 13.7 % (ref 11.5–15.5)
WBC: 11.3 10*3/uL — AB (ref 4.0–10.5)

## 2014-09-04 MED ORDER — HYDROCODONE-ACETAMINOPHEN 5-325 MG PO TABS: 1.0000 | ORAL_TABLET | Freq: Four times a day (QID) | ORAL | Status: DC | PRN

## 2014-09-04 NOTE — OB Triage Note (Signed)
Discharge instructions given and discussed with patient- all questions answered. Pt verbalized understanding. IV DC'd. Pt. Discharged home with significant other.

## 2014-09-04 NOTE — Discharge Summary (Signed)
Physician Discharge Summary  Patient ID: Brenda Valdez Fluhr MRN: [id number] DOB/AGE: 28/04/1986 28 y.o.  Admit date: 09/03/2014 Discharge date: 09/04/2014  Admission Diagnoses: right sided abdominal pain, IUP@ 23 weeks, leukocytosis  Discharge Diagnoses: right sided abdominal pain, IUP @ 23 2/7 weeks, umbilical and ventral hernia  Active Problems:   Abdominal pain affecting pregnancy   Leukocytosis   Umbilical hernia   Ventral hernia   Discharged Condition: good  Hospital Course: pt was sent to MAU for further evaluation of sudden onset of right sided abdominal pain. See M. Bhambri note. Pt had abdominal sonogram, CT scan to r/o appendix. Pt was placed under obs due to continued pain and elevated WBC. The pt was given IVF, pain med. Her symptoms improved the next day and her wbc decreased. Pt agrees with d/c planning  Consults: general surgery  Significant Diagnostic Studies: labs:  CBC    Component Value Date/Time   WBC 11.3* 09/04/2014 0745   RBC 3.62* 09/04/2014 0745   HGB 10.0* 09/04/2014 0745   HCT 30.6* 09/04/2014 0745   PLT 211 09/04/2014 0745   MCV 84.5 09/04/2014 0745   MCH 27.6 09/04/2014 0745   MCHC 32.7 09/04/2014 0745   RDW 13.7 09/04/2014 0745   LYMPHSABS 2.3 02/09/2011 1827   MONOABS 0.9 02/09/2011 1827   EOSABS 0.4 02/09/2011 1827   BASOSABS 0.0 02/09/2011 1827      Treatments: IV hydration and analgesia: acetaminophen  Discharge Exam: Blood pressure 92/53, pulse 78, temperature 98.2 F (36.8 C), temperature source Oral, resp. rate 18, height 5\' 2"  (1.575 m), weight 88.905 kg (196 lb). General appearance: alert, cooperative and no distress Cardio: regular rate and rhythm, S1, S2 normal, no murmur, click, rub or gallop GI: soft gravid mild tender on right lateral to fundus no guarding or rebound Extremities: no edema, redness or tenderness in the calves or thighs Lymph nodes: none  Disposition: 01-Home or Self Care  Discharge Instructions    Discharge  patient    Complete by:  As directed             Medication List    TAKE these medications        albuterol 108 (90 BASE) MCG/ACT inhaler  Commonly known as:  PROVENTIL HFA;VENTOLIN HFA  Inhale 2 puffs into the lungs every 6 (six) hours as needed. For wheezing     HYDROcodone-acetaminophen 5-325 MG per tablet  Commonly known as:  NORCO/VICODIN  Take 1-2 tablets by mouth every 6 (six) hours as needed for severe pain.     prenatal multivitamin Tabs tablet  Take 1 tablet by mouth daily at 12 noon.     valACYclovir 500 MG tablet  Commonly known as:  VALTREX  Take 500 mg by mouth 2 (two) times daily as needed.           Follow-up Information    Follow up with Junaid Wurzer A, MD On 09/11/2014.   Specialty:  Obstetrics and Gynecology   Contact information:   [address] [place] [address] [phone]       Signed: Maxie BetterOUSINS,Merilee Wible Valdez 09/04/2014, 8:52 AM

## 2014-09-04 NOTE — Discharge Instructions (Signed)
Call if pain worsening, leakage of fluid, decreased fetal movement Abdominal Pain During Pregnancy Belly (abdominal) pain is common during pregnancy. Most of the time, it is not a serious problem. Other times, it can be a sign that something is wrong with the pregnancy. Always tell your doctor if you have belly pain. HOME CARE Monitor your belly pain for any changes. The following actions may help you feel better:  Do not have sex (intercourse) or put anything in your vagina until you feel better.  Rest until your pain stops.  Drink clear fluids if you feel sick to your stomach (nauseous). Do not eat solid food until you feel better.  Only take medicine as told by your doctor.  Keep all doctor visits as told. GET HELP RIGHT AWAY IF:   You are bleeding, leaking fluid, or pieces of tissue come out of your vagina.  You have more pain or cramping.  You keep throwing up (vomiting).  You have pain when you pee (urinate) or have blood in your pee.  You have a fever.  You do not feel your baby moving as much.  You feel very weak or feel like passing out.  You have trouble breathing, with or without belly pain.  You have a very bad headache and belly pain.  You have fluid leaking from your vagina and belly pain.  You keep having watery poop (diarrhea).  Your belly pain does not go away after resting, or the pain gets worse. MAKE SURE YOU:   Understand these instructions.  Will watch your condition.  Will get help right away if you are not doing well or get worse. Document Released: 07/08/2009 Document Revised: 03/22/2013 Document Reviewed: 02/16/2013 [facility] Patient Information 2015 [place]. This information is not intended to replace advice given to you by your health care provider. Make sure you discuss any questions you have with your health care provider.

## 2014-09-04 NOTE — Progress Notes (Signed)
S Pain better  (+) FM  O: Blood pressure 92/53, pulse 78, temperature 98.2 F (36.8 C), temperature source Oral, resp. rate 18, height 5\' 2"  (1.575 m), weight 88.905 kg (196 lb).  Abd; soft gravid mildly tender  Right lateral fundal area w/o rebound Pelvic deferred Extr: no edema  CBC Latest Ref Rng 09/04/2014 09/03/2014 06/16/2013  WBC 4.0 - 10.5 K/uL 11.3(H) 14.6(H) 8.7  Hemoglobin 12.0 - 15.0 g/dL 10.0(L) 10.6(L) 11.9(L)  Hematocrit 36.0 - 46.0 % 30.6(L) 32.1(L) 36.0  Platelets 150 - 400 K/uL 211 238 264   Ct Abdomen Pelvis Wo Contrast  09/03/2014   CLINICAL DATA:  Right lower quadrant pain elevated white blood cell count. Concern for appendicitis. Pregnant patient.  EXAM: CT ABDOMEN AND PELVIS WITHOUT CONTRAST  TECHNIQUE: Multidetector CT imaging of the abdomen and pelvis was performed following the standard protocol without IV contrast.  COMPARISON:  Ultrasound 09/03/2014  FINDINGS: Lower chest:  Lung bases are clear.  No pericardial fluid.  Hepatobiliary: Non IV contrast images demonstrate no focal hepatic lesion. Normal gallbladder.  Pancreas: Pancreas is normal. No ductal dilatation. No pancreatic inflammation.  Spleen: Normal spleen.  Adrenals/urinary tract: Adrenal glands and kidneys are normal. There is no hydronephrosis or hydroureter. No obstructing calculi in the ureters.  Stomach/Bowel: The stomach, small bowel, and cecum normal. The appendix extends medial from the cecum along the lateral margin of the gravid uterus (images 53 through 42 of series 2). The appendix is normal volume with no periappendiceal inflammation. There is gas within the tip which is thin wall. No evidence appendicitis.  The colon and rectosigmoid colon are normal.  Vascular/Lymphatic: Abdominal aorta is normal caliber. There is no retroperitoneal or periportal lymphadenopathy. No pelvic lymphadenopathy.  Reproductive: Gravid uterus noted. The ovaries are not clearly identified.  Musculoskeletal: No aggressive osseous  lesion.  Other: There is an umbilical hernia which is small. Superior to the umbilical hernia there is a small ventral hernia measuring 15 x 12 mm (image 83, series 603).  IMPRESSION: 1. No explanation for elevated white blood cell count. 2. Normal appendix. 3. Normal gallbladder and kidneys on this noncontrast exam. 4. Small umbilical hernia and midline ventral hernia just superior to the umbilical hernia.   Electronically Signed   By: Genevive BiStewart  Edmunds M.D.   On: 09/03/2014 20:39   [place] Abdomen Complete  09/03/2014   CLINICAL DATA:  Abdominal pain  EXAM: ULTRASOUND ABDOMEN COMPLETE  COMPARISON:  None.  FINDINGS: Gallbladder: No gallstones or wall thickening visualized. No sonographic Murphy sign noted.  Common bile duct: Diameter: 3.7 mm  Liver: No focal lesion identified. Within normal limits in parenchymal echogenicity.  IVC: No abnormality visualized.  Pancreas: Visualized portion unremarkable.  Spleen: Size and appearance within normal limits.  Right Kidney: Length: 11.9 cm. Echogenicity within normal limits. No mass or hydronephrosis visualized.  Left Kidney: Length: 11.1 cm. Echogenicity within normal limits. No mass or hydronephrosis visualized.  Abdominal aorta: No aneurysm visualized.  Other findings: None.  IMPRESSION: 1. Normal exam.   Electronically Signed   By: Signa Kellaylor  Stroud M.D.   On: 09/03/2014 16:13   FHR 152  IMP: Right sided abdominal pain  Of unclear etiology with extensive workup . Improved wbc IUP @ 23+ weeks P) d/c home. vicodin prn use. Warm heat to area. Keep schedule OB appt. D/c instructions reviewed

## 2014-09-19 ENCOUNTER — Encounter: Payer: 59 | Attending: Obstetrics and Gynecology

## 2014-09-19 VITALS — Ht 62.0 in | Wt 199.4 lb

## 2014-09-19 DIAGNOSIS — Z713 Dietary counseling and surveillance: Secondary | ICD-10-CM | POA: Diagnosis not present

## 2014-09-19 DIAGNOSIS — O24419 Gestational diabetes mellitus in pregnancy, unspecified control: Secondary | ICD-10-CM | POA: Diagnosis not present

## 2014-09-19 DIAGNOSIS — Z3A00 Weeks of gestation of pregnancy not specified: Secondary | ICD-10-CM | POA: Insufficient documentation

## 2014-09-19 NOTE — Progress Notes (Signed)
  Patient was seen on 09/19/14 for Gestational Diabetes self-management . The following learning objectives were met by the patient :   States the definition of Gestational Diabetes  States why dietary management is important in controlling blood glucose  Describes the effects of carbohydrates on blood glucose levels  Demonstrates ability to create a balanced meal plan  Demonstrates carbohydrate counting   States when to check blood glucose levels  Demonstrates proper blood glucose monitoring techniques  States the effect of stress and exercise on blood glucose levels  States the importance of limiting caffeine and abstaining from alcohol and smoking  Plan:  Aim for 2 Carb Choices per meal (30 grams) +/- 1 either way for breakfast Aim for 3 Carb Choices per meal (45 grams) +/- 1 either way from lunch and dinner Aim for 1-2 Carbs per snack Begin reading food labels for Total Carbohydrate and sugar grams of foods Consider  increasing your activity level by walking daily as tolerated Begin checking BG before breakfast and 2 hours after first bit of breakfast, lunch and dinner after  as directed by MD  Take medication  as directed by MD  Blood glucose monitor given:  One Touch Ultra Mini Self Monitoring Kit Lot # [id number] X Exp: 01/2015 Blood glucose reading: 124m/dl 2 1/2hpp (banana, toast, peanut Butter)  Patient instructed to monitor glucose levels: FBS: 60 - <90 2 hour: <120  Patient received the following handouts:  Nutrition Diabetes and Pregnancy  Carbohydrate Counting List  Meal Planning worksheet  Patient will be seen for follow-up as needed.

## 2014-11-23 ENCOUNTER — Inpatient Hospital Stay (HOSPITAL_COMMUNITY)
Admission: AD | Admit: 2014-11-23 | Discharge: 2014-11-23 | Disposition: A | Discharge status: Home / Self Care | Payer: 59 | Source: Ambulatory Visit | Attending: Obstetrics and Gynecology | Admitting: Obstetrics and Gynecology

## 2014-11-23 ENCOUNTER — Encounter (HOSPITAL_COMMUNITY): Payer: Self-pay | Admitting: *Deleted

## 2014-11-23 DIAGNOSIS — Z3A34 34 weeks gestation of pregnancy: Secondary | ICD-10-CM | POA: Insufficient documentation

## 2014-11-23 DIAGNOSIS — O4703 False labor before 37 completed weeks of gestation, third trimester: Secondary | ICD-10-CM

## 2014-11-23 HISTORY — DX: Hyperlipidemia, unspecified: E78.5

## 2014-11-23 LAB — URINALYSIS, ROUTINE W REFLEX MICROSCOPIC
BILIRUBIN URINE: NEGATIVE
Glucose, UA: NEGATIVE mg/dL
HGB URINE DIPSTICK: NEGATIVE
Ketones, ur: NEGATIVE mg/dL
Leukocytes, UA: NEGATIVE
Nitrite: NEGATIVE
PH: 7 (ref 5.0–8.0)
Protein, ur: NEGATIVE mg/dL
Specific Gravity, Urine: 1.01 (ref 1.005–1.030)
Urobilinogen, UA: 0.2 mg/dL (ref 0.0–1.0)

## 2014-11-23 MED ORDER — VALACYCLOVIR HCL 500 MG PO TABS: 500.0000 mg | ORAL_TABLET | Freq: Every day | ORAL | Status: DC

## 2014-11-23 NOTE — Progress Notes (Signed)
Orders obtained

## 2014-11-23 NOTE — H&P (Deleted)
Brenda Valdez A Brenda Valdez is an 29 y.o. female. BF presents for endometrial ablation using novasure. Pt has been on oral contraception due to prolonged vaginal bleeding  Pertinent Gynecological History: Menses:   Menstrual History: Menarche age:  No LMP recorded. Patient is pregnant.    Past Medical History  Diagnosis Date  . No pertinent past medical history   . Asthma     excercise induced  . Gestational diabetes mellitus, antepartum     Past Surgical History  Procedure Laterality Date  . No past surgeries    . Laparoscopy  03/24/2011    Procedure: LAPAROSCOPY DIAGNOSTIC;  Surgeon: Meriel Picaichard M Holland;  Location: [facility];  Service: Gynecology;  Laterality: N/A;  . Leep  08/11/2012    Procedure: LOOP ELECTROSURGICAL EXCISION PROCEDURE (LEEP);  Surgeon: Meriel Picaichard M Holland, MD;  Location: [facility];  Service: Gynecology;  Laterality: N/A;  . Wisdom tooth extraction    . 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  Social History:  reports that she has never smoked. She has never used smokeless tobacco. She reports that she does not drink alcohol or use illicit drugs.  Allergies: No Known Allergies  Prescriptions prior to admission  Medication Sig Dispense Refill Last Dose  . albuterol (PROVENTIL HFA;VENTOLIN HFA) 108 (90 BASE) MCG/ACT inhaler Inhale 2 puffs into the lungs every 6 (six) hours as needed. For wheezing     . HYDROcodone-acetaminophen (NORCO/VICODIN) 5-325 MG per tablet Take 1-2 tablets by mouth every 6 (six) hours as needed for severe pain. 30 tablet 0   . Prenatal Vit-Fe Fumarate-FA (PRENATAL MULTIVITAMIN) TABS tablet Take 1 tablet by mouth daily at 12 noon.   09/02/2014 at Unknown time  . valACYclovir (VALTREX) 500 MG tablet Take 500 mg by mouth 2 (two) times daily as needed.    More  than a month at Unknown time    Review of Systems  All other systems reviewed and are negative.   Blood pressure 120/59, pulse 78, temperature 98 F (36.7 C), resp. rate 18, height 5\' 2"  (1.575 m), weight 92.08 kg (203 lb). Physical Exam  Constitutional: She is oriented to person, place, and time. She appears well-developed and well-nourished.  Eyes: EOM are normal.  Neck: Neck supple.  Cardiovascular: Regular rhythm.   Respiratory: Breath sounds normal.  GI: Soft.  Neurological: She is alert and oriented to person, place, and time.  Skin: Skin is warm and dry.   Vulva" no lesion Vagina: no lesion Cervix parous Uterus: Av nl  Adnexa nl   Assessment/Plan: DUB P) dx hysteroscopy, D&C. novasure endometrial ablation. Procedure explained. Risk of surgery reviewed including, infection, bleeding, uterine perforation( 08/998) and its risk, failure 90% rate, thermal surgery. Injury to surrounding organ structures  Tavaughn Silguero A 11/23/2014, 12:50 PM

## 2014-11-23 NOTE — Discharge Instructions (Signed)
Labor precautions, call for rupture membrane, daily kick ct

## 2014-11-23 NOTE — MAU Note (Signed)
Sent from office with c/o contractions.  Contractions every 3-4 minutes, pain of 2of10 Denies bright red vaginal bleeding.  Positive fetal movement.  Complains of SROM/LOF  GDM with this pregnancy

## 2014-11-23 NOTE — Progress Notes (Signed)
Discussing plan of care for patient

## 2014-11-23 NOTE — MAU Note (Signed)
Pt presents to MAU with complaints of contractions. Spoke with Dr Cherly Hensenousins and was told to come in and be evaluated.

## 2014-11-23 NOTE — MAU Note (Signed)
History     Chief Complaint  Patient presents with  . Contractions   28 yo G2P0010 BF @ 34 5/[redacted] weeks gestation sent from office for further evaluation of c/o ctx noted on NST in office  q 4 mins. Fern test was pending. Hx LEEP  OB History    Gravida Para Term Preterm AB TAB SAB Ectopic Multiple Living   2    1  1          Past Medical History  Diagnosis Date  . No pertinent past medical history   . Asthma     excercise induced  . Gestational diabetes mellitus, antepartum   . Hyperlipidemia     Past Surgical History  Procedure Laterality Date  . No past surgeries    . Laparoscopy  03/24/2011    Procedure: LAPAROSCOPY DIAGNOSTIC;  Surgeon: Meriel Picaichard M Holland;  Location: [facility];  Service: Gynecology;  Laterality: N/A;  . Leep  08/11/2012    Procedure: LOOP ELECTROSURGICAL EXCISION PROCEDURE (LEEP);  Surgeon: Meriel Picaichard M Holland, MD;  Location: [facility];  Service: Gynecology;  Laterality: N/A;  . Wisdom tooth extraction    . 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  History  Substance Use Topics  . Smoking status: Never Smoker   . Smokeless tobacco: Never Used  . Alcohol Use: No     Comment: socially    Allergies: No Known Allergies  Prescriptions prior to admission  Medication Sig Dispense Refill Last Dose  . ferrous sulfate 325 (65 FE) MG tablet Take 325 mg by mouth daily with breakfast.   Past Week at Unknown time  . glyBURIDE (DIABETA) 2.5 MG tablet Take 7.5-10 mg by mouth 2 (two) times daily with a meal. Patient takes 7.5mg  in the morning and 10 mg at night   11/23/2014 at Unknown time  . Prenatal Vit-Fe Fumarate-FA (PRENATAL MULTIVITAMIN) TABS tablet Take 1 tablet by mouth daily at 12 noon.   11/22/2014 at Unknown time  . albuterol (PROVENTIL HFA;VENTOLIN HFA) 108 (90  BASE) MCG/ACT inhaler Inhale 2 puffs into the lungs every 6 (six) hours as needed. For wheezing     . HYDROcodone-acetaminophen (NORCO/VICODIN) 5-325 MG per tablet Take 1-2 tablets by mouth every 6 (six) hours as needed for severe pain. (Patient not taking: Reported on 11/23/2014) 30 tablet 0   . valACYclovir (VALTREX) 500 MG tablet Take 500 mg by mouth 2 (two) times daily as needed.    More than a month at Unknown time     Physical Exam   Blood pressure 120/59, pulse 78, temperature 98 F (36.7 C), resp. rate 18, height 5\' 2"  (1.575 m), weight 92.08 kg (203 lb).  General: WDWN female in NAD ABD: gravid soft nontender Pelvic" closed/long/-3 vtx Ext no edema  Tracing: baseline 140 (+) accel irreg ctx  Fern neg  IMP: PMC no evidence of labor IUP @ 34 5/7 weeks  P) labor prec given. Start valtrex as of Sunday. Keep OB appts

## 2014-11-25 LAB — URINE CULTURE
COLONY COUNT: NO GROWTH
CULTURE: NO GROWTH
SPECIAL REQUESTS: NORMAL

## 2014-12-05 ENCOUNTER — Encounter (HOSPITAL_COMMUNITY): Payer: Self-pay | Admitting: *Deleted

## 2014-12-05 ENCOUNTER — Inpatient Hospital Stay (HOSPITAL_COMMUNITY)
Admission: AD | Admit: 2014-12-05 | Discharge: 2014-12-05 | Disposition: A | Discharge status: Home / Self Care | Payer: 59 | Source: Ambulatory Visit | Attending: Obstetrics and Gynecology | Admitting: Obstetrics and Gynecology

## 2014-12-05 DIAGNOSIS — Z3A36 36 weeks gestation of pregnancy: Secondary | ICD-10-CM | POA: Insufficient documentation

## 2014-12-05 DIAGNOSIS — O368130 Decreased fetal movements, third trimester, not applicable or unspecified: Secondary | ICD-10-CM | POA: Diagnosis not present

## 2014-12-05 DIAGNOSIS — O24419 Gestational diabetes mellitus in pregnancy, unspecified control: Secondary | ICD-10-CM | POA: Diagnosis not present

## 2014-12-05 DIAGNOSIS — O368131 Decreased fetal movements, third trimester, fetus 1: Secondary | ICD-10-CM

## 2014-12-05 HISTORY — DX: Unspecified abnormal cytological findings in specimens from vagina: R87.629

## 2014-12-05 HISTORY — DX: Decreased fetal movements, third trimester, not applicable or unspecified: O36.8130

## 2014-12-05 NOTE — MAU Provider Note (Signed)
History     CSN: [id number]  Arrival date and time: 12/05/14 1010  Provider at bedside: 1110     Chief Complaint  Patient presents with  . Non-stress Test   HPI  Ms. Brenda Valdez is 29 yo G2P0010 female at [redacted] wks gestation sent from the office with complaints of no fetal movement since bedtime last night.  She had a non-reactive NST and BPP 8/8 1-2 days ago. Her prenatal care is complicated with GDM.  Her primary OB provider is Dr. Cherly Hensenousins.  Past Medical History  Diagnosis Date  . No pertinent past medical history   . Asthma     excercise induced  . Gestational diabetes mellitus, antepartum   . Hyperlipidemia   . Vaginal Pap smear, abnormal     Past Surgical History  Procedure Laterality Date  . Laparoscopy  03/24/2011    Procedure: LAPAROSCOPY DIAGNOSTIC;  Surgeon: Meriel Picaichard M Holland;  Location: [facility];  Service: Gynecology;  Laterality: N/A;  . Leep  08/11/2012    Procedure: LOOP ELECTROSURGICAL EXCISION PROCEDURE (LEEP);  Surgeon: Meriel Picaichard M Holland, MD;  Location: [facility];  Service: Gynecology;  Laterality: N/A;  . Wisdom tooth extraction    . Laser ablation condolamata N/A 06/16/2013    Procedure:  CO2 LASER ABLATION CONDOLAMATA;  Surgeon: Meriel Picaichard M Holland, MD;  Location: [facility];  Service: Gynecology;  Laterality: N/A;  CO2 LASER ABLATION OF VAIN & CIN    History reviewed. No pertinent family history.  History  Substance Use Topics  . Smoking status: Never Smoker   . Smokeless tobacco: Never Used  . Alcohol Use: No     Comment: socially    Allergies: No Known Allergies  Prescriptions prior to admission  Medication Sig Dispense Refill Last Dose  . ferrous sulfate 325 (65 FE) MG tablet Take 325 mg by mouth daily with breakfast.   Past Week at Unknown time  . glyBURIDE (DIABETA) 2.5 MG tablet Take 7.5-10 mg by mouth 2 (two) times daily with a meal. Patient takes 7.5mg  in the morning and 10 mg at night   12/05/2014 at Unknown time  . Prenatal Vit-Fe Fumarate-FA (PRENATAL  MULTIVITAMIN) TABS tablet Take 1 tablet by mouth daily at 12 noon.   12/04/2014 at Unknown time  . valACYclovir (VALTREX) 500 MG tablet Take 1 tablet (500 mg total) by mouth daily. 30 tablet 6 12/04/2014 at Unknown time  . albuterol (PROVENTIL HFA;VENTOLIN HFA) 108 (90 BASE) MCG/ACT inhaler Inhale 2 puffs into the lungs every 6 (six) hours as needed. For wheezing       Review of Systems  Constitutional: Negative.   HENT: Negative.   Eyes: Negative.   Respiratory: Negative.   Cardiovascular: Negative.   Gastrointestinal: Negative.   Genitourinary:       No FM since bedtime last night; (+) FM since arrival to hospital; no ctxs; no VB or LOF  Musculoskeletal: Negative.   Skin: Negative.   Neurological: Negative.   Endo/Heme/Allergies: Negative.   Psychiatric/Behavioral: Negative.    CEFM FHR: 155 bpm / moderate variability / accels present / no decels TOCO: Irregular UC's, mild to palpation  Physical Exam   Blood pressure 122/66, pulse 73, temperature 97.8 F (36.6 C), temperature source Oral, resp. rate 18, height 5' 2.5" (1.588 m), weight 93.441 kg (206 lb).  Physical Exam  Constitutional: She is oriented to person, place, and time. She appears well-developed and well-nourished.  HENT:  Head: Normocephalic and atraumatic.  Eyes: Pupils are equal, round, and reactive  to light.  Neck: Normal range of motion.  Cardiovascular: Normal rate, regular rhythm and normal heart sounds.   Respiratory: Effort normal and breath sounds normal.  GI: Soft. Bowel sounds are normal.  Genitourinary:  Pelvic deferred; occ. ctxs  Musculoskeletal: Normal range of motion.  Neurological: She is alert and oriented to person, place, and time. She has normal reflexes.  Skin: Skin is warm and dry.  Psychiatric: She has a normal mood and affect. Her behavior is normal. Judgment and thought content normal.    MAU Course  Procedures CEFM  Assessment and Plan  IUP at [redacted] wks gestation Category 1 FHR  tracing  Discharge home Daily FKCs at home  Labor Precautions Keep scheduled appointment on 12/07/2014  Raelyn MoraAWSON, Diem Pagnotta, Judie PetitM MSN, CNM 12/05/2014, 11:13 AM

## 2014-12-05 NOTE — Discharge Instructions (Signed)
Fetal Movement Counts °Patient Name: __________________________________________________ Patient Due Date: ____________________ °Performing a fetal movement count is highly recommended in high-risk pregnancies, but it is good for every pregnant woman to do. Your health care provider may ask you to start counting fetal movements at 28 weeks of the pregnancy. Fetal movements often increase: °· After eating a full meal. °· After physical activity. °· After eating or drinking something sweet or cold. °· At rest. °Pay attention to when you feel the baby is most active. This will help you notice a pattern of your baby's sleep and wake cycles and what factors contribute to an increase in fetal movement. It is important to perform a fetal movement count at the same time each day when your baby is normally most active.  °HOW TO COUNT FETAL MOVEMENTS °· Find a quiet and comfortable area to sit or lie down on your left side. Lying on your left side provides the best blood and oxygen circulation to your baby. °· Write down the day and time on a sheet of paper or in a journal. °· Start counting kicks, flutters, swishes, rolls, or jabs in a 2-hour period. You should feel at least 10 movements within 2 hours. °· If you do not feel 10 movements in 2 hours, wait 2-3 hours and count again. Look for a change in the pattern or not enough counts in 2 hours. °SEEK MEDICAL CARE IF: °· You feel less than 10 counts in 2 hours, tried twice. °· There is no movement in over an hour. °· The pattern is changing or taking longer each day to reach 10 counts in 2 hours. °· You feel the baby is not moving as he or she usually does. °Date: ____________ Movements: ____________ Start time: ____________ Finish time: ____________  °Date: ____________ Movements: ____________ Start time: ____________ Finish time: ____________ °Date: ____________ Movements: ____________ Start time: ____________ Finish time: ____________ °Date: ____________ Movements:  ____________ Start time: ____________ Finish time: ____________ °Date: ____________ Movements: ____________ Start time: ____________ Finish time: ____________ °Date: ____________ Movements: ____________ Start time: ____________ Finish time: ____________ °Date: ____________ Movements: ____________ Start time: ____________ Finish time: ____________ °Date: ____________ Movements: ____________ Start time: ____________ Finish time: ____________  °Date: ____________ Movements: ____________ Start time: ____________ Finish time: ____________ °Date: ____________ Movements: ____________ Start time: ____________ Finish time: ____________ °Date: ____________ Movements: ____________ Start time: ____________ Finish time: ____________ °Date: ____________ Movements: ____________ Start time: ____________ Finish time: ____________ °Date: ____________ Movements: ____________ Start time: ____________ Finish time: ____________ °Date: ____________ Movements: ____________ Start time: ____________ Finish time: ____________ °Date: ____________ Movements: ____________ Start time: ____________ Finish time: ____________  °Date: ____________ Movements: ____________ Start time: ____________ Finish time: ____________ °Date: ____________ Movements: ____________ Start time: ____________ Finish time: ____________ °Date: ____________ Movements: ____________ Start time: ____________ Finish time: ____________ °Date: ____________ Movements: ____________ Start time: ____________ Finish time: ____________ °Date: ____________ Movements: ____________ Start time: ____________ Finish time: ____________ °Date: ____________ Movements: ____________ Start time: ____________ Finish time: ____________ °Date: ____________ Movements: ____________ Start time: ____________ Finish time: ____________  °Date: ____________ Movements: ____________ Start time: ____________ Finish time: ____________ °Date: ____________ Movements: ____________ Start time: ____________ Finish  time: ____________ °Date: ____________ Movements: ____________ Start time: ____________ Finish time: ____________ °Date: ____________ Movements: ____________ Start time: ____________ Finish time: ____________ °Date: ____________ Movements: ____________ Start time: ____________ Finish time: ____________ °Date: ____________ Movements: ____________ Start time: ____________ Finish time: ____________ °Date: ____________ Movements: ____________ Start time: ____________ Finish time: ____________  °Date: ____________ Movements: ____________ Start time: ____________ Finish   time: ____________ °Date: ____________ Movements: ____________ Start time: ____________ Finish time: ____________ °Date: ____________ Movements: ____________ Start time: ____________ Finish time: ____________ °Date: ____________ Movements: ____________ Start time: ____________ Finish time: ____________ °Date: ____________ Movements: ____________ Start time: ____________ Finish time: ____________ °Date: ____________ Movements: ____________ Start time: ____________ Finish time: ____________ °Date: ____________ Movements: ____________ Start time: ____________ Finish time: ____________  °Date: ____________ Movements: ____________ Start time: ____________ Finish time: ____________ °Date: ____________ Movements: ____________ Start time: ____________ Finish time: ____________ °Date: ____________ Movements: ____________ Start time: ____________ Finish time: ____________ °Date: ____________ Movements: ____________ Start time: ____________ Finish time: ____________ °Date: ____________ Movements: ____________ Start time: ____________ Finish time: ____________ °Date: ____________ Movements: ____________ Start time: ____________ Finish time: ____________ °Date: ____________ Movements: ____________ Start time: ____________ Finish time: ____________  °Date: ____________ Movements: ____________ Start time: ____________ Finish time: ____________ °Date: ____________  Movements: ____________ Start time: ____________ Finish time: ____________ °Date: ____________ Movements: ____________ Start time: ____________ Finish time: ____________ °Date: ____________ Movements: ____________ Start time: ____________ Finish time: ____________ °Date: ____________ Movements: ____________ Start time: ____________ Finish time: ____________ °Date: ____________ Movements: ____________ Start time: ____________ Finish time: ____________ °Date: ____________ Movements: ____________ Start time: ____________ Finish time: ____________  °Date: ____________ Movements: ____________ Start time: ____________ Finish time: ____________ °Date: ____________ Movements: ____________ Start time: ____________ Finish time: ____________ °Date: ____________ Movements: ____________ Start time: ____________ Finish time: ____________ °Date: ____________ Movements: ____________ Start time: ____________ Finish time: ____________ °Date: ____________ Movements: ____________ Start time: ____________ Finish time: ____________ °Date: ____________ Movements: ____________ Start time: ____________ Finish time: ____________ °Document Released: 08/19/2006 Document Revised: 12/04/2013 Document Reviewed: 05/16/2012 °[employer]® Patient Information ©2015 [employer]. This information is not intended to replace advice given to you by your health care provider. Make sure you discuss any questions you have with your health care provider. °Braxton Hicks Contractions °Contractions of the uterus can occur throughout pregnancy. Contractions are not always a sign that you are in labor.  °WHAT ARE BRAXTON HICKS CONTRACTIONS?  °Contractions that occur before labor are called Braxton Hicks contractions, or false labor. Toward the end of pregnancy (32-34 weeks), these contractions can develop more often and may become more forceful. This is not true labor because these contractions do not result in opening (dilatation) and thinning of the cervix. They  are sometimes difficult to tell apart from true labor because these contractions can be forceful and people have different pain tolerances. You should not feel embarrassed if you go to the hospital with false labor. Sometimes, the only way to tell if you are in true labor is for your health care provider to look for changes in the cervix. °If there are no prenatal problems or other health problems associated with the pregnancy, it is completely safe to be sent home with false labor and await the onset of true labor. °HOW CAN YOU TELL THE DIFFERENCE BETWEEN TRUE AND FALSE LABOR? °False Labor °· The contractions of false labor are usually shorter and not as hard as those of true labor.   °· The contractions are usually irregular.   °· The contractions are often felt in the front of the lower abdomen and in the groin.   °· The contractions may go away when you walk around or change positions while lying down.   °· The contractions get weaker and are shorter lasting as time goes on.   °· The contractions do not usually become progressively stronger, regular, and closer together as with true labor.   °True Labor °· Contractions in true labor last 30-70 seconds, become   very regular, usually become more intense, and increase in frequency.   °· The contractions do not go away with walking.   °· The discomfort is usually felt in the top of the uterus and spreads to the lower abdomen and low back.   °· True labor can be determined by your health care provider with an exam. This will show that the cervix is dilating and getting thinner.   °WHAT TO REMEMBER °· Keep up with your usual exercises and follow other instructions given by your health care provider.   °· Take medicines as directed by your health care provider.   °· Keep your regular prenatal appointments.   °· Eat and drink lightly if you think you are going into labor.   °· If Braxton Hicks contractions are making you uncomfortable:   °¨ Change your position from  lying down or resting to walking, or from walking to resting.   °¨ Sit and rest in a tub of warm water.   °¨ Drink 2-3 glasses of water. Dehydration may cause these contractions.   °¨ Do slow and deep breathing several times an hour.   °WHEN SHOULD I SEEK IMMEDIATE MEDICAL CARE? °Seek immediate medical care if: °· Your contractions become stronger, more regular, and closer together.   °· You have fluid leaking or gushing from your vagina.   °· You have a fever.   °· You pass blood-tinged mucus.   °· You have vaginal bleeding.   °· You have continuous abdominal pain.   °· You have low back pain that you never had before.   °· You feel your baby's head pushing down and causing pelvic pressure.   °· Your baby is not moving as much as it used to.   °Document Released: 07/20/2005 Document Revised: 07/25/2013 Document Reviewed: 05/01/2013 °[employer]® Patient Information ©2015 [employer]. This information is not intended to replace advice given to you by your health care provider. Make sure you discuss any questions you have with your health care provider. ° °

## 2014-12-05 NOTE — MAU Note (Signed)
Yesterday had scheduled NST and BPP. Bpp was 8/8 due to gestational diabetes.  States this morning has felt no FM. Called office and was advised to come to MAU for NST.

## 2014-12-18 ENCOUNTER — Other Ambulatory Visit: Payer: Self-pay | Admitting: Obstetrics and Gynecology

## 2014-12-22 ENCOUNTER — Inpatient Hospital Stay (HOSPITAL_COMMUNITY)
Admission: AD | Admit: 2014-12-22 | Discharge: 2014-12-22 | Disposition: A | Discharge status: Home / Self Care | Payer: 59 | Source: Ambulatory Visit | Attending: Obstetrics and Gynecology | Admitting: Obstetrics and Gynecology

## 2014-12-22 ENCOUNTER — Encounter (HOSPITAL_COMMUNITY): Payer: Self-pay | Admitting: *Deleted

## 2014-12-22 DIAGNOSIS — O3663X0 Maternal care for excessive fetal growth, third trimester, not applicable or unspecified: Secondary | ICD-10-CM | POA: Diagnosis not present

## 2014-12-22 LAB — POCT FERN TEST: POCT Fern Test: NEGATIVE

## 2014-12-22 NOTE — MAU Note (Signed)
Dr. Cherly Hensenousins aware that patient had a few variables prior to coming off monitors for DC home.  Reviewed variables with MD and okay to continue with discharge home.

## 2014-12-22 NOTE — Discharge Instructions (Signed)
Braxton Hicks Contractions °Contractions of the uterus can occur throughout pregnancy. Contractions are not always a sign that you are in labor.  °WHAT ARE BRAXTON HICKS CONTRACTIONS?  °Contractions that occur before labor are called Braxton Hicks contractions, or false labor. Toward the end of pregnancy (32-34 weeks), these contractions can develop more often and may become more forceful. This is not true labor because these contractions do not result in opening (dilatation) and thinning of the cervix. They are sometimes difficult to tell apart from true labor because these contractions can be forceful and people have different pain tolerances. You should not feel embarrassed if you go to the hospital with false labor. Sometimes, the only way to tell if you are in true labor is for your health care provider to look for changes in the cervix. °If there are no prenatal problems or other health problems associated with the pregnancy, it is completely safe to be sent home with false labor and await the onset of true labor. °HOW CAN YOU TELL THE DIFFERENCE BETWEEN TRUE AND FALSE LABOR? °False Labor °· The contractions of false labor are usually shorter and not as hard as those of true labor.   °· The contractions are usually irregular.   °· The contractions are often felt in the front of the lower abdomen and in the groin.   °· The contractions may go away when you walk around or change positions while lying down.   °· The contractions get weaker and are shorter lasting as time goes on.   °· The contractions do not usually become progressively stronger, regular, and closer together as with true labor.   °True Labor °1. Contractions in true labor last 30-70 seconds, become very regular, usually become more intense, and increase in frequency.   °2. The contractions do not go away with walking.   °3. The discomfort is usually felt in the top of the uterus and spreads to the lower abdomen and low back.   °4. True labor can  be determined by your health care provider with an exam. This will show that the cervix is dilating and getting thinner.   °WHAT TO REMEMBER °· Keep up with your usual exercises and follow other instructions given by your health care provider.   °· Take medicines as directed by your health care provider.   °· Keep your regular prenatal appointments.   °· Eat and drink lightly if you think you are going into labor.   °· If Braxton Hicks contractions are making you uncomfortable:   °· Change your position from lying down or resting to walking, or from walking to resting.   °· Sit and rest in a tub of warm water.   °· Drink 2-3 glasses of water. Dehydration may cause these contractions.   °· Do slow and deep breathing several times an hour.   °WHEN SHOULD I SEEK IMMEDIATE MEDICAL CARE? °Seek immediate medical care if: °· Your contractions become stronger, more regular, and closer together.   °· You have fluid leaking or gushing from your vagina.   °· You have a fever.   °· You pass blood-tinged mucus.   °· You have vaginal bleeding.   °· You have continuous abdominal pain.   °· You have low back pain that you never had before.   °· You feel your baby's head pushing down and causing pelvic pressure.   °· Your baby is not moving as much as it used to.   °Document Released: 07/20/2005 Document Revised: 07/25/2013 Document Reviewed: 05/01/2013 °Exit[employer]® Patient Information ©2015 [employer]. This information is not intended to replace advice given to you by your health care   provider. Make sure you discuss any questions you have with your health care provider. ° °Fetal Movement Counts °Patient Name: __________________________________________________ Patient Due Date: ____________________ °Performing a fetal movement count is highly recommended in high-risk pregnancies, but it is good for every pregnant woman to do. Your health care provider may ask you to start counting fetal movements at 28 weeks of the pregnancy. Fetal  movements often increase: °· After eating a full meal. °· After physical activity. °· After eating or drinking something sweet or cold. °· At rest. °Pay attention to when you feel the baby is most active. This will help you notice a pattern of your baby's sleep and wake cycles and what factors contribute to an increase in fetal movement. It is important to perform a fetal movement count at the same time each day when your baby is normally most active.  °HOW TO COUNT FETAL MOVEMENTS °5. Find a quiet and comfortable area to sit or lie down on your left side. Lying on your left side provides the best blood and oxygen circulation to your baby. °6. Write down the day and time on a sheet of paper or in a journal. °7. Start counting kicks, flutters, swishes, rolls, or jabs in a 2-hour period. You should feel at least 10 movements within 2 hours. °8. If you do not feel 10 movements in 2 hours, wait 2-3 hours and count again. Look for a change in the pattern or not enough counts in 2 hours. °SEEK MEDICAL CARE IF: °· You feel less than 10 counts in 2 hours, tried twice. °· There is no movement in over an hour. °· The pattern is changing or taking longer each day to reach 10 counts in 2 hours. °· You feel the baby is not moving as he or she usually does. °Date: ____________ Movements: ____________ Start time: ____________ Finish time: ____________  °Date: ____________ Movements: ____________ Start time: ____________ Finish time: ____________ °Date: ____________ Movements: ____________ Start time: ____________ Finish time: ____________ °Date: ____________ Movements: ____________ Start time: ____________ Finish time: ____________ °Date: ____________ Movements: ____________ Start time: ____________ Finish time: ____________ °Date: ____________ Movements: ____________ Start time: ____________ Finish time: ____________ °Date: ____________ Movements: ____________ Start time: ____________ Finish time: ____________ °Date: ____________  Movements: ____________ Start time: ____________ Finish time: ____________  °Date: ____________ Movements: ____________ Start time: ____________ Finish time: ____________ °Date: ____________ Movements: ____________ Start time: ____________ Finish time: ____________ °Date: ____________ Movements: ____________ Start time: ____________ Finish time: ____________ °Date: ____________ Movements: ____________ Start time: ____________ Finish time: ____________ °Date: ____________ Movements: ____________ Start time: ____________ Finish time: ____________ °Date: ____________ Movements: ____________ Start time: ____________ Finish time: ____________ °Date: ____________ Movements: ____________ Start time: ____________ Finish time: ____________  °Date: ____________ Movements: ____________ Start time: ____________ Finish time: ____________ °Date: ____________ Movements: ____________ Start time: ____________ Finish time: ____________ °Date: ____________ Movements: ____________ Start time: ____________ Finish time: ____________ °Date: ____________ Movements: ____________ Start time: ____________ Finish time: ____________ °Date: ____________ Movements: ____________ Start time: ____________ Finish time: ____________ °Date: ____________ Movements: ____________ Start time: ____________ Finish time: ____________ °Date: ____________ Movements: ____________ Start time: ____________ Finish time: ____________  °Date: ____________ Movements: ____________ Start time: ____________ Finish time: ____________ °Date: ____________ Movements: ____________ Start time: ____________ Finish time: ____________ °Date: ____________ Movements: ____________ Start time: ____________ Finish time: ____________ °Date: ____________ Movements: ____________ Start time: ____________ Finish time: ____________ °Date: ____________ Movements: ____________ Start time: ____________ Finish time: ____________ °Date: ____________ Movements: ____________ Start time:  ____________ Finish time: ____________ °Date: ____________ Movements:   ____________ Start time: ____________ Finish time: ____________  °Date: ____________ Movements: ____________ Start time: ____________ Finish time: ____________ °Date: ____________ Movements: ____________ Start time: ____________ Finish time: ____________ °Date: ____________ Movements: ____________ Start time: ____________ Finish time: ____________ °Date: ____________ Movements: ____________ Start time: ____________ Finish time: ____________ °Date: ____________ Movements: ____________ Start time: ____________ Finish time: ____________ °Date: ____________ Movements: ____________ Start time: ____________ Finish time: ____________ °Date: ____________ Movements: ____________ Start time: ____________ Finish time: ____________  °Date: ____________ Movements: ____________ Start time: ____________ Finish time: ____________ °Date: ____________ Movements: ____________ Start time: ____________ Finish time: ____________ °Date: ____________ Movements: ____________ Start time: ____________ Finish time: ____________ °Date: ____________ Movements: ____________ Start time: ____________ Finish time: ____________ °Date: ____________ Movements: ____________ Start time: ____________ Finish time: ____________ °Date: ____________ Movements: ____________ Start time: ____________ Finish time: ____________ °Date: ____________ Movements: ____________ Start time: ____________ Finish time: ____________  °Date: ____________ Movements: ____________ Start time: ____________ Finish time: ____________ °Date: ____________ Movements: ____________ Start time: ____________ Finish time: ____________ °Date: ____________ Movements: ____________ Start time: ____________ Finish time: ____________ °Date: ____________ Movements: ____________ Start time: ____________ Finish time: ____________ °Date: ____________ Movements: ____________ Start time: ____________ Finish time: ____________ °Date:  ____________ Movements: ____________ Start time: ____________ Finish time: ____________ °Date: ____________ Movements: ____________ Start time: ____________ Finish time: ____________  °Date: ____________ Movements: ____________ Start time: ____________ Finish time: ____________ °Date: ____________ Movements: ____________ Start time: ____________ Finish time: ____________ °Date: ____________ Movements: ____________ Start time: ____________ Finish time: ____________ °Date: ____________ Movements: ____________ Start time: ____________ Finish time: ____________ °Date: ____________ Movements: ____________ Start time: ____________ Finish time: ____________ °Date: ____________ Movements: ____________ Start time: ____________ Finish time: ____________ °Document Released: 08/19/2006 Document Revised: 12/04/2013 Document Reviewed: 05/16/2012 °[employer]® Patient Information ©2015 [employer]. This information is not intended to replace advice given to you by your health care provider. Make sure you discuss any questions you have with your health care provider. ° °

## 2014-12-22 NOTE — MAU Note (Signed)
Patient states contractions started around 2000 12/21/14.  She states that she is unsure if her water broke, but felt a gush of fluid.  Denies vaginal bleeding.  States feeling baby moving well.

## 2014-12-23 ENCOUNTER — Inpatient Hospital Stay (HOSPITAL_COMMUNITY)
Admission: AD | Admit: 2014-12-23 | Discharge: 2014-12-23 | Disposition: A | Discharge status: Home / Self Care | Payer: 59 | Source: Ambulatory Visit | Attending: Obstetrics and Gynecology | Admitting: Obstetrics and Gynecology

## 2014-12-23 ENCOUNTER — Encounter (HOSPITAL_COMMUNITY): Payer: Self-pay | Admitting: *Deleted

## 2014-12-23 DIAGNOSIS — O24419 Gestational diabetes mellitus in pregnancy, unspecified control: Secondary | ICD-10-CM

## 2014-12-23 DIAGNOSIS — Z79899 Other long term (current) drug therapy: Secondary | ICD-10-CM

## 2014-12-23 DIAGNOSIS — O368131 Decreased fetal movements, third trimester, fetus 1: Secondary | ICD-10-CM

## 2014-12-23 DIAGNOSIS — O368130 Decreased fetal movements, third trimester, not applicable or unspecified: Secondary | ICD-10-CM

## 2014-12-23 DIAGNOSIS — Z3A39 39 weeks gestation of pregnancy: Secondary | ICD-10-CM | POA: Insufficient documentation

## 2014-12-23 HISTORY — DX: Decreased fetal movements, third trimester, not applicable or unspecified: O36.8130

## 2014-12-23 NOTE — MAU Note (Signed)
Pt noticed there was less movement yesterday morning. Felt a small nudge yesterday around 1430.  Has not felt any movement since, denies and LOF/vb.

## 2014-12-23 NOTE — MAU Provider Note (Signed)
History     CSN: [id number]  Arrival date and time: 12/23/14 0732  Orders placed in EPIC: 0742 Provider notified: [phone] Provider at bedside: 0815     Chief Complaint  Patient presents with  . Decreased Fetal Movement   HPI  Ms. Brenda Valdez is a 29 yo G3P0010 female at [redacted] wks gestation presenting with complaints of decreased to almost no FM in 24 hrs.  She was last seen in MAU for labor evaluation on Friday with good fetal movement, but has not felt much of anything all day Saturday.  She did FKCs, drank sweet drinks, moved her belly around with "only a couple of nudges" in response.  She is really worried.  She has had several episodes of decreased/no FM with reactive NSTs and normal BPPs.  Her OB hx is significant for: H/O LEEP, HSV and Class A2 GDM. Her primary OB provider at [facility] is Dr. Cherly Hensen.  Past Medical History  Diagnosis Date  . No pertinent past medical history   . Asthma     excercise induced  . Gestational diabetes mellitus, antepartum   . Hyperlipidemia   . Vaginal Pap smear, abnormal   . Decreased fetal movement during pregnancy in third trimester, antepartum 12/05/2014  . Decreased fetal movement in pregnancy in third trimester, antepartum 12/23/2014    Past Surgical History  Procedure Laterality Date  . Laparoscopy  03/24/2011    Procedure: LAPAROSCOPY DIAGNOSTIC;  Surgeon: Meriel Pica;  Location: [facility];  Service: Gynecology;  Laterality: N/A;  . Leep  08/11/2012    Procedure: LOOP ELECTROSURGICAL EXCISION PROCEDURE (LEEP);  Surgeon: Meriel Pica, MD;  Location: [facility];  Service: Gynecology;  Laterality: N/A;  . Wisdom tooth extraction    . 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 No pertinent family history.  History  Substance Use Topics  . Smoking status:  Never Smoker   . Smokeless tobacco: Never Used  . Alcohol Use: No     Comment: socially    Allergies: No Known Allergies  Prescriptions prior to admission  Medication Sig Dispense Refill Last Dose  . ferrous sulfate 325 (65 FE) MG tablet Take 325 mg by mouth daily with breakfast.   Past Week at Unknown time  . glyBURIDE (DIABETA) 2.5 MG tablet Take 10 mg by mouth 2 (two) times daily with a meal. Pt takes  morning and night   12/22/2014 at Unknown time  . Prenatal Vit-Fe Fumarate-FA (PRENATAL MULTIVITAMIN) TABS tablet Take 1 tablet by mouth daily at 12 noon.   12/22/2014 at Unknown time  . valACYclovir (VALTREX) 500 MG tablet Take 1 tablet (500 mg total) by mouth daily. 30 tablet 6 12/22/2014 at Unknown time  . albuterol (PROVENTIL HFA;VENTOLIN HFA) 108 (90 BASE) MCG/ACT inhaler Inhale 2 puffs into the lungs every 6 (six) hours as needed. For wheezing   rescue    Review of Systems  Constitutional: Negative.   HENT: Negative.   Eyes: Negative.   Respiratory: Negative.   Cardiovascular: Negative.   Gastrointestinal: Negative.   Genitourinary:       (+) FM since arrival to MAU  Musculoskeletal: Negative.   Skin: Negative.   Neurological: Negative.   Endo/Heme/Allergies: Negative.   Psychiatric/Behavioral: Negative.     NST: FHR: 150 bpm / moderate  variability / accels present / occ variable decel - not persistent TOCO: Occ UC's noted  Physical Exam   Blood pressure 135/86, pulse 83, temperature 98.2 F (36.8 C), temperature source Oral, resp. rate 18, last menstrual period 03/25/2014.  Physical Exam  Constitutional: She is oriented to person, place, and time. She appears well-developed and well-nourished.  HENT:  Head: Normocephalic and atraumatic.  Eyes: Pupils are equal, round, and reactive to light.  Neck: Normal range of motion.  Cardiovascular: Normal rate, regular rhythm and normal heart sounds.   Respiratory: Effort normal and breath sounds normal.  GI: Soft.  Bowel sounds are normal.  Genitourinary:  Pelvic deferred; only occ UC's  Musculoskeletal: Normal range of motion.  Neurological: She is alert and oriented to person, place, and time. She has normal reflexes.  Skin: Skin is warm and dry.  Psychiatric: She has a normal mood and affect. Her behavior is normal. Judgment and thought content normal.    MAU Course  Procedures NST  Assessment and Plan  29 yo G3P0020 with IUP 2 [redacted] wks gestation Decreased Fetal Movement Class A2 GDM Category 1 FHR tracing  [facility] Inst[facility] - someone from the office will cancel appt for pt  *Dr. Cherly Hensenousins notified of reactive NST and plan to d.c home - agrees  Raelyn MoraAWSON, Midori Dado, Judie PetitM MSN, CNM 12/23/2014, 8:22 AM

## 2014-12-23 NOTE — Discharge Instructions (Signed)
Fetal Movement Counts °Patient Name: __________________________________________________ Patient Due Date: ____________________ °Performing a fetal movement count is highly recommended in high-risk pregnancies, but it is good for every pregnant woman to do. Your health care provider may ask you to start counting fetal movements at 28 weeks of the pregnancy. Fetal movements often increase: °· After eating a full meal. °· After physical activity. °· After eating or drinking something sweet or cold. °· At rest. °Pay attention to when you feel the baby is most active. This will help you notice a pattern of your baby's sleep and wake cycles and what factors contribute to an increase in fetal movement. It is important to perform a fetal movement count at the same time each day when your baby is normally most active.  °HOW TO COUNT FETAL MOVEMENTS °· Find a quiet and comfortable area to sit or lie down on your left side. Lying on your left side provides the best blood and oxygen circulation to your baby. °· Write down the day and time on a sheet of paper or in a journal. °· Start counting kicks, flutters, swishes, rolls, or jabs in a 2-hour period. You should feel at least 10 movements within 2 hours. °· If you do not feel 10 movements in 2 hours, wait 2-3 hours and count again. Look for a change in the pattern or not enough counts in 2 hours. °SEEK MEDICAL CARE IF: °· You feel less than 10 counts in 2 hours, tried twice. °· There is no movement in over an hour. °· The pattern is changing or taking longer each day to reach 10 counts in 2 hours. °· You feel the baby is not moving as he or she usually does. °Date: ____________ Movements: ____________ Start time: ____________ Finish time: ____________  °Date: ____________ Movements: ____________ Start time: ____________ Finish time: ____________ °Date: ____________ Movements: ____________ Start time: ____________ Finish time: ____________ °Date: ____________ Movements:  ____________ Start time: ____________ Finish time: ____________ °Date: ____________ Movements: ____________ Start time: ____________ Finish time: ____________ °Date: ____________ Movements: ____________ Start time: ____________ Finish time: ____________ °Date: ____________ Movements: ____________ Start time: ____________ Finish time: ____________ °Date: ____________ Movements: ____________ Start time: ____________ Finish time: ____________  °Date: ____________ Movements: ____________ Start time: ____________ Finish time: ____________ °Date: ____________ Movements: ____________ Start time: ____________ Finish time: ____________ °Date: ____________ Movements: ____________ Start time: ____________ Finish time: ____________ °Date: ____________ Movements: ____________ Start time: ____________ Finish time: ____________ °Date: ____________ Movements: ____________ Start time: ____________ Finish time: ____________ °Date: ____________ Movements: ____________ Start time: ____________ Finish time: ____________ °Date: ____________ Movements: ____________ Start time: ____________ Finish time: ____________  °Date: ____________ Movements: ____________ Start time: ____________ Finish time: ____________ °Date: ____________ Movements: ____________ Start time: ____________ Finish time: ____________ °Date: ____________ Movements: ____________ Start time: ____________ Finish time: ____________ °Date: ____________ Movements: ____________ Start time: ____________ Finish time: ____________ °Date: ____________ Movements: ____________ Start time: ____________ Finish time: ____________ °Date: ____________ Movements: ____________ Start time: ____________ Finish time: ____________ °Date: ____________ Movements: ____________ Start time: ____________ Finish time: ____________  °Date: ____________ Movements: ____________ Start time: ____________ Finish time: ____________ °Date: ____________ Movements: ____________ Start time: ____________ Finish  time: ____________ °Date: ____________ Movements: ____________ Start time: ____________ Finish time: ____________ °Date: ____________ Movements: ____________ Start time: ____________ Finish time: ____________ °Date: ____________ Movements: ____________ Start time: ____________ Finish time: ____________ °Date: ____________ Movements: ____________ Start time: ____________ Finish time: ____________ °Date: ____________ Movements: ____________ Start time: ____________ Finish time: ____________  °Date: ____________ Movements: ____________ Start time: ____________ Finish   time: ____________ °Date: ____________ Movements: ____________ Start time: ____________ Finish time: ____________ °Date: ____________ Movements: ____________ Start time: ____________ Finish time: ____________ °Date: ____________ Movements: ____________ Start time: ____________ Finish time: ____________ °Date: ____________ Movements: ____________ Start time: ____________ Finish time: ____________ °Date: ____________ Movements: ____________ Start time: ____________ Finish time: ____________ °Date: ____________ Movements: ____________ Start time: ____________ Finish time: ____________  °Date: ____________ Movements: ____________ Start time: ____________ Finish time: ____________ °Date: ____________ Movements: ____________ Start time: ____________ Finish time: ____________ °Date: ____________ Movements: ____________ Start time: ____________ Finish time: ____________ °Date: ____________ Movements: ____________ Start time: ____________ Finish time: ____________ °Date: ____________ Movements: ____________ Start time: ____________ Finish time: ____________ °Date: ____________ Movements: ____________ Start time: ____________ Finish time: ____________ °Date: ____________ Movements: ____________ Start time: ____________ Finish time: ____________  °Date: ____________ Movements: ____________ Start time: ____________ Finish time: ____________ °Date: ____________  Movements: ____________ Start time: ____________ Finish time: ____________ °Date: ____________ Movements: ____________ Start time: ____________ Finish time: ____________ °Date: ____________ Movements: ____________ Start time: ____________ Finish time: ____________ °Date: ____________ Movements: ____________ Start time: ____________ Finish time: ____________ °Date: ____________ Movements: ____________ Start time: ____________ Finish time: ____________ °Date: ____________ Movements: ____________ Start time: ____________ Finish time: ____________  °Date: ____________ Movements: ____________ Start time: ____________ Finish time: ____________ °Date: ____________ Movements: ____________ Start time: ____________ Finish time: ____________ °Date: ____________ Movements: ____________ Start time: ____________ Finish time: ____________ °Date: ____________ Movements: ____________ Start time: ____________ Finish time: ____________ °Date: ____________ Movements: ____________ Start time: ____________ Finish time: ____________ °Date: ____________ Movements: ____________ Start time: ____________ Finish time: ____________ °Document Released: 08/19/2006 Document Revised: 12/04/2013 Document Reviewed: 05/16/2012 °[employer]® Patient Information ©2015 [employer]. This information is not intended to replace advice given to you by your health care provider. Make sure you discuss any questions you have with your health care provider. °Braxton Hicks Contractions °Contractions of the uterus can occur throughout pregnancy. Contractions are not always a sign that you are in labor.  °WHAT ARE BRAXTON HICKS CONTRACTIONS?  °Contractions that occur before labor are called Braxton Hicks contractions, or false labor. Toward the end of pregnancy (32-34 weeks), these contractions can develop more often and may become more forceful. This is not true labor because these contractions do not result in opening (dilatation) and thinning of the cervix. They  are sometimes difficult to tell apart from true labor because these contractions can be forceful and people have different pain tolerances. You should not feel embarrassed if you go to the hospital with false labor. Sometimes, the only way to tell if you are in true labor is for your health care provider to look for changes in the cervix. °If there are no prenatal problems or other health problems associated with the pregnancy, it is completely safe to be sent home with false labor and await the onset of true labor. °HOW CAN YOU TELL THE DIFFERENCE BETWEEN TRUE AND FALSE LABOR? °False Labor °· The contractions of false labor are usually shorter and not as hard as those of true labor.   °· The contractions are usually irregular.   °· The contractions are often felt in the front of the lower abdomen and in the groin.   °· The contractions may go away when you walk around or change positions while lying down.   °· The contractions get weaker and are shorter lasting as time goes on.   °· The contractions do not usually become progressively stronger, regular, and closer together as with true labor.   °True Labor °· Contractions in true labor last 30-70 seconds, become   very regular, usually become more intense, and increase in frequency.   °· The contractions do not go away with walking.   °· The discomfort is usually felt in the top of the uterus and spreads to the lower abdomen and low back.   °· True labor can be determined by your health care provider with an exam. This will show that the cervix is dilating and getting thinner.   °WHAT TO REMEMBER °· Keep up with your usual exercises and follow other instructions given by your health care provider.   °· Take medicines as directed by your health care provider.   °· Keep your regular prenatal appointments.   °· Eat and drink lightly if you think you are going into labor.   °· If Braxton Hicks contractions are making you uncomfortable:   °¨ Change your position from  lying down or resting to walking, or from walking to resting.   °¨ Sit and rest in a tub of warm water.   °¨ Drink 2-3 glasses of water. Dehydration may cause these contractions.   °¨ Do slow and deep breathing several times an hour.   °WHEN SHOULD I SEEK IMMEDIATE MEDICAL CARE? °Seek immediate medical care if: °· Your contractions become stronger, more regular, and closer together.   °· You have fluid leaking or gushing from your vagina.   °· You have a fever.   °· You pass blood-tinged mucus.   °· You have vaginal bleeding.   °· You have continuous abdominal pain.   °· You have low back pain that you never had before.   °· You feel your baby's head pushing down and causing pelvic pressure.   °· Your baby is not moving as much as it used to.   °Document Released: 07/20/2005 Document Revised: 07/25/2013 Document Reviewed: 05/01/2013 °[employer]® Patient Information ©2015 [employer]. This information is not intended to replace advice given to you by your health care provider. Make sure you discuss any questions you have with your health care provider. ° °

## 2014-12-24 ENCOUNTER — Encounter (HOSPITAL_COMMUNITY): Payer: Self-pay | Admitting: Anesthesiology

## 2014-12-24 ENCOUNTER — Inpatient Hospital Stay (HOSPITAL_COMMUNITY)
Admission: AD | Admit: 2014-12-24 | Discharge: 2014-12-28 | DRG: 766 | Disposition: A | Discharge status: Home / Self Care | Payer: 59 | Source: Ambulatory Visit | Attending: Obstetrics and Gynecology | Admitting: Obstetrics and Gynecology

## 2014-12-24 ENCOUNTER — Encounter (HOSPITAL_COMMUNITY): Payer: Self-pay | Admitting: *Deleted

## 2014-12-24 DIAGNOSIS — O9952 Diseases of the respiratory system complicating childbirth: Secondary | ICD-10-CM | POA: Diagnosis present

## 2014-12-24 DIAGNOSIS — D509 Iron deficiency anemia, unspecified: Secondary | ICD-10-CM | POA: Diagnosis present

## 2014-12-24 DIAGNOSIS — Z3A39 39 weeks gestation of pregnancy: Secondary | ICD-10-CM

## 2014-12-24 DIAGNOSIS — O9902 Anemia complicating childbirth: Secondary | ICD-10-CM | POA: Diagnosis present

## 2014-12-24 DIAGNOSIS — O24429 Gestational diabetes mellitus in childbirth, unspecified control: Secondary | ICD-10-CM | POA: Diagnosis present

## 2014-12-24 DIAGNOSIS — Z79899 Other long term (current) drug therapy: Secondary | ICD-10-CM

## 2014-12-24 DIAGNOSIS — O3663X0 Maternal care for excessive fetal growth, third trimester, not applicable or unspecified: Principal | ICD-10-CM | POA: Diagnosis present

## 2014-12-24 DIAGNOSIS — O3660X0 Maternal care for excessive fetal growth, unspecified trimester, not applicable or unspecified: Secondary | ICD-10-CM | POA: Diagnosis present

## 2014-12-24 DIAGNOSIS — O99019 Anemia complicating pregnancy, unspecified trimester: Secondary | ICD-10-CM

## 2014-12-24 DIAGNOSIS — J45909 Unspecified asthma, uncomplicated: Secondary | ICD-10-CM | POA: Diagnosis present

## 2014-12-24 DIAGNOSIS — D62 Acute posthemorrhagic anemia: Secondary | ICD-10-CM | POA: Diagnosis not present

## 2014-12-24 DIAGNOSIS — O24419 Gestational diabetes mellitus in pregnancy, unspecified control: Secondary | ICD-10-CM

## 2014-12-24 LAB — GLUCOSE, CAPILLARY: Glucose-Capillary: 74 mg/dL (ref 65–99)

## 2014-12-24 MED ORDER — BUTORPHANOL TARTRATE 1 MG/ML IJ SOLN
1.0000 mg | Freq: Once | INTRAMUSCULAR | Status: AC
  Administered 2014-12-24: 1 mg via INTRAMUSCULAR
  Filled 2014-12-24: qty 1

## 2014-12-24 NOTE — MAU Note (Signed)
Pt started ctx 2 hours ago. Denies LOF, VB. +FM. Scheduled CS 230 Wed. Last ate 2 hours  Ago.

## 2014-12-25 ENCOUNTER — Encounter (HOSPITAL_COMMUNITY)
Admission: AD | Disposition: A | Discharge status: Home / Self Care | Payer: Self-pay | Source: Ambulatory Visit | Attending: Obstetrics and Gynecology

## 2014-12-25 ENCOUNTER — Inpatient Hospital Stay (HOSPITAL_COMMUNITY): Payer: 59 | Admitting: Anesthesiology

## 2014-12-25 ENCOUNTER — Encounter (HOSPITAL_COMMUNITY): Payer: Self-pay | Admitting: Obstetrics & Gynecology

## 2014-12-25 ENCOUNTER — Inpatient Hospital Stay (HOSPITAL_COMMUNITY): Admission: RE | Admit: 2014-12-25 | Payer: 59 | Source: Ambulatory Visit

## 2014-12-25 DIAGNOSIS — J45909 Unspecified asthma, uncomplicated: Secondary | ICD-10-CM | POA: Diagnosis present

## 2014-12-25 DIAGNOSIS — Z3A39 39 weeks gestation of pregnancy: Secondary | ICD-10-CM | POA: Diagnosis not present

## 2014-12-25 DIAGNOSIS — O24429 Gestational diabetes mellitus in childbirth, unspecified control: Secondary | ICD-10-CM | POA: Diagnosis present

## 2014-12-25 DIAGNOSIS — O3663X0 Maternal care for excessive fetal growth, third trimester, not applicable or unspecified: Secondary | ICD-10-CM | POA: Diagnosis present

## 2014-12-25 DIAGNOSIS — D509 Iron deficiency anemia, unspecified: Secondary | ICD-10-CM | POA: Diagnosis present

## 2014-12-25 DIAGNOSIS — O9902 Anemia complicating childbirth: Secondary | ICD-10-CM | POA: Diagnosis present

## 2014-12-25 DIAGNOSIS — O9952 Diseases of the respiratory system complicating childbirth: Secondary | ICD-10-CM | POA: Diagnosis present

## 2014-12-25 DIAGNOSIS — O24419 Gestational diabetes mellitus in pregnancy, unspecified control: Secondary | ICD-10-CM

## 2014-12-25 DIAGNOSIS — Z79899 Other long term (current) drug therapy: Secondary | ICD-10-CM | POA: Diagnosis not present

## 2014-12-25 DIAGNOSIS — O3660X0 Maternal care for excessive fetal growth, unspecified trimester, not applicable or unspecified: Secondary | ICD-10-CM | POA: Diagnosis present

## 2014-12-25 LAB — RPR: RPR Ser Ql: NONREACTIVE

## 2014-12-25 LAB — CBC
HCT: 31.8 % — ABNORMAL LOW (ref 36.0–46.0)
Hemoglobin: 10.7 g/dL — ABNORMAL LOW (ref 12.0–15.0)
MCH: 27.8 pg (ref 26.0–34.0)
MCHC: 33.6 g/dL (ref 30.0–36.0)
MCV: 82.6 fL (ref 78.0–100.0)
PLATELETS: 205 10*3/uL (ref 150–400)
RBC: 3.85 MIL/uL — AB (ref 3.87–5.11)
RDW: 15 % (ref 11.5–15.5)
WBC: 14.1 10*3/uL — AB (ref 4.0–10.5)

## 2014-12-25 LAB — GLUCOSE, CAPILLARY
GLUCOSE-CAPILLARY: 90 mg/dL (ref 65–99)
Glucose-Capillary: 134 mg/dL — ABNORMAL HIGH (ref 65–99)

## 2014-12-25 SURGERY — Surgical Case
Anesthesia: Spinal

## 2014-12-25 MED ORDER — SENNOSIDES-DOCUSATE SODIUM 8.6-50 MG PO TABS
2.0000 | ORAL_TABLET | ORAL | Status: DC
  Administered 2014-12-25 – 2014-12-27 (×3): 2 via ORAL
  Filled 2014-12-25 (×3): qty 2

## 2014-12-25 MED ORDER — NALBUPHINE HCL 10 MG/ML IJ SOLN
5.0000 mg | Freq: Once | INTRAMUSCULAR | Status: AC | PRN
  Administered 2014-12-25: 5 mg via INTRAVENOUS

## 2014-12-25 MED ORDER — SIMETHICONE 80 MG PO CHEW
80.0000 mg | CHEWABLE_TABLET | ORAL | Status: DC
  Administered 2014-12-25 – 2014-12-27 (×3): 80 mg via ORAL
  Filled 2014-12-25 (×3): qty 1

## 2014-12-25 MED ORDER — ONDANSETRON HCL 4 MG/2ML IJ SOLN
INTRAMUSCULAR | Status: AC
  Filled 2014-12-25: qty 2

## 2014-12-25 MED ORDER — SIMETHICONE 80 MG PO CHEW
80.0000 mg | CHEWABLE_TABLET | Freq: Three times a day (TID) | ORAL | Status: DC
  Administered 2014-12-25 – 2014-12-28 (×8): 80 mg via ORAL
  Filled 2014-12-25 (×9): qty 1

## 2014-12-25 MED ORDER — PROMETHAZINE HCL 25 MG/ML IJ SOLN: 12.5000 mg | Freq: Four times a day (QID) | INTRAMUSCULAR | Status: DC | PRN

## 2014-12-25 MED ORDER — NALOXONE HCL 0.4 MG/ML IJ SOLN: 0.4000 mg | INTRAMUSCULAR | Status: DC | PRN

## 2014-12-25 MED ORDER — MORPHINE SULFATE 0.5 MG/ML IJ SOLN
INTRAMUSCULAR | Status: AC
  Filled 2014-12-25: qty 10

## 2014-12-25 MED ORDER — LACTATED RINGERS IV SOLN
INTRAVENOUS | Status: DC | PRN
  Administered 2014-12-25: 02:00:00 via INTRAVENOUS

## 2014-12-25 MED ORDER — BUPIVACAINE HCL (PF) 0.25 % IJ SOLN
INTRAMUSCULAR | Status: AC
  Filled 2014-12-25: qty 20

## 2014-12-25 MED ORDER — KETOROLAC TROMETHAMINE 30 MG/ML IJ SOLN: 30.0000 mg | Freq: Four times a day (QID) | INTRAMUSCULAR | Status: DC | PRN

## 2014-12-25 MED ORDER — OXYCODONE-ACETAMINOPHEN 5-325 MG PO TABS
2.0000 | ORAL_TABLET | ORAL | Status: DC | PRN
  Administered 2014-12-25 – 2014-12-28 (×9): 2 via ORAL
  Filled 2014-12-25 (×9): qty 2

## 2014-12-25 MED ORDER — DIPHENHYDRAMINE HCL 50 MG/ML IJ SOLN: 12.5000 mg | INTRAMUSCULAR | Status: DC | PRN

## 2014-12-25 MED ORDER — METHYLERGONOVINE MALEATE 0.2 MG/ML IJ SOLN: 0.2000 mg | INTRAMUSCULAR | Status: DC | PRN

## 2014-12-25 MED ORDER — BUPIVACAINE HCL (PF) 0.5 % IJ SOLN
INTRAMUSCULAR | Status: AC
  Filled 2014-12-25: qty 30

## 2014-12-25 MED ORDER — MEPERIDINE HCL 25 MG/ML IJ SOLN
6.2500 mg | INTRAMUSCULAR | Status: DC | PRN
Start: 2014-12-25 — End: 2014-12-25

## 2014-12-25 MED ORDER — NALBUPHINE HCL 10 MG/ML IJ SOLN: 5.0000 mg | Freq: Once | INTRAMUSCULAR | Status: AC | PRN

## 2014-12-25 MED ORDER — 0.9 % SODIUM CHLORIDE (POUR BTL) OPTIME
TOPICAL | Status: DC | PRN
  Administered 2014-12-25: 1000 mL

## 2014-12-25 MED ORDER — ONDANSETRON HCL 4 MG/2ML IJ SOLN
INTRAMUSCULAR | Status: DC | PRN
  Administered 2014-12-25: 4 mg via INTRAVENOUS

## 2014-12-25 MED ORDER — FENTANYL CITRATE (PF) 100 MCG/2ML IJ SOLN
INTRAMUSCULAR | Status: DC | PRN
  Administered 2014-12-25: 20 ug via INTRATHECAL

## 2014-12-25 MED ORDER — OXYTOCIN 10 UNIT/ML IJ SOLN
40.0000 [IU] | INTRAVENOUS | Status: DC | PRN
  Administered 2014-12-25: 40 [IU] via INTRAVENOUS

## 2014-12-25 MED ORDER — SCOPOLAMINE 1 MG/3DAYS TD PT72
1.0000 | MEDICATED_PATCH | Freq: Once | TRANSDERMAL | Status: DC
  Filled 2014-12-25: qty 1

## 2014-12-25 MED ORDER — BUPIVACAINE HCL 0.25 % IJ SOLN
INTRAMUSCULAR | Status: DC | PRN
  Administered 2014-12-25: 10 mL

## 2014-12-25 MED ORDER — MENTHOL 3 MG MT LOZG: 1.0000 | LOZENGE | OROMUCOSAL | Status: DC | PRN

## 2014-12-25 MED ORDER — OXYTOCIN 40 UNITS IN LACTATED RINGERS INFUSION - SIMPLE MED: 62.5000 mL/h | INTRAVENOUS | Status: AC

## 2014-12-25 MED ORDER — SODIUM CHLORIDE 0.9 % IJ SOLN: 3.0000 mL | INTRAMUSCULAR | Status: DC | PRN

## 2014-12-25 MED ORDER — WITCH HAZEL-GLYCERIN EX PADS: 1.0000 "application " | MEDICATED_PAD | CUTANEOUS | Status: DC | PRN

## 2014-12-25 MED ORDER — ZOLPIDEM TARTRATE 5 MG PO TABS: 5.0000 mg | ORAL_TABLET | Freq: Every evening | ORAL | Status: DC | PRN

## 2014-12-25 MED ORDER — LANOLIN HYDROUS EX OINT: 1.0000 "application " | TOPICAL_OINTMENT | CUTANEOUS | Status: DC | PRN

## 2014-12-25 MED ORDER — LACTATED RINGERS IV SOLN
INTRAVENOUS | Status: DC | PRN
  Administered 2014-12-25 (×3): via INTRAVENOUS

## 2014-12-25 MED ORDER — KETOROLAC TROMETHAMINE 30 MG/ML IJ SOLN
30.0000 mg | Freq: Four times a day (QID) | INTRAMUSCULAR | Status: DC | PRN
  Administered 2014-12-25: 30 mg via INTRAVENOUS

## 2014-12-25 MED ORDER — OXYCODONE-ACETAMINOPHEN 5-325 MG PO TABS
1.0000 | ORAL_TABLET | ORAL | Status: DC | PRN
  Administered 2014-12-25 – 2014-12-28 (×3): 1 via ORAL
  Filled 2014-12-25 (×3): qty 1

## 2014-12-25 MED ORDER — FENTANYL CITRATE (PF) 100 MCG/2ML IJ SOLN
INTRAMUSCULAR | Status: AC
  Filled 2014-12-25: qty 2

## 2014-12-25 MED ORDER — CITRIC ACID-SODIUM CITRATE 334-500 MG/5ML PO SOLN
30.0000 mL | Freq: Once | ORAL | Status: DC
  Filled 2014-12-25: qty 15

## 2014-12-25 MED ORDER — SODIUM CHLORIDE 0.9 % IV SOLN: 250.0000 mL | INTRAVENOUS | Status: DC

## 2014-12-25 MED ORDER — CEFAZOLIN SODIUM-DEXTROSE 2-3 GM-% IV SOLR
2.0000 g | INTRAVENOUS | Status: AC
  Administered 2014-12-25: 2 g via INTRAVENOUS

## 2014-12-25 MED ORDER — SCOPOLAMINE 1 MG/3DAYS TD PT72
MEDICATED_PATCH | TRANSDERMAL | Status: DC | PRN
  Administered 2014-12-25: 1 via TRANSDERMAL

## 2014-12-25 MED ORDER — DIPHENHYDRAMINE HCL 25 MG PO CAPS: 25.0000 mg | ORAL_CAPSULE | ORAL | Status: DC | PRN

## 2014-12-25 MED ORDER — BUPIVACAINE HCL (PF) 0.25 % IJ SOLN
INTRAMUSCULAR | Status: AC
  Filled 2014-12-25: qty 10

## 2014-12-25 MED ORDER — SODIUM CHLORIDE 0.9 % IJ SOLN
3.0000 mL | Freq: Two times a day (BID) | INTRAMUSCULAR | Status: DC
  Administered 2014-12-25: 3 mL via INTRAVENOUS

## 2014-12-25 MED ORDER — OXYTOCIN 10 UNIT/ML IJ SOLN
INTRAMUSCULAR | Status: AC
  Filled 2014-12-25: qty 4

## 2014-12-25 MED ORDER — IBUPROFEN 600 MG PO TABS
600.0000 mg | ORAL_TABLET | Freq: Four times a day (QID) | ORAL | Status: DC
  Administered 2014-12-25 – 2014-12-28 (×13): 600 mg via ORAL
  Filled 2014-12-25 (×13): qty 1

## 2014-12-25 MED ORDER — SIMETHICONE 80 MG PO CHEW: 80.0000 mg | CHEWABLE_TABLET | ORAL | Status: DC | PRN

## 2014-12-25 MED ORDER — MORPHINE SULFATE (PF) 0.5 MG/ML IJ SOLN
INTRAMUSCULAR | Status: DC | PRN
  Administered 2014-12-25: .2 mg via INTRATHECAL

## 2014-12-25 MED ORDER — DIBUCAINE 1 % RE OINT: 1.0000 "application " | TOPICAL_OINTMENT | RECTAL | Status: DC | PRN

## 2014-12-25 MED ORDER — PHENYLEPHRINE 8 MG IN D5W 100 ML (0.08MG/ML) PREMIX OPTIME
INJECTION | INTRAVENOUS | Status: DC | PRN
  Administered 2014-12-25: 60 ug/min via INTRAVENOUS

## 2014-12-25 MED ORDER — NALBUPHINE HCL 10 MG/ML IJ SOLN
INTRAMUSCULAR | Status: AC
  Administered 2014-12-25: 5 mg via INTRAVENOUS
  Filled 2014-12-25: qty 1

## 2014-12-25 MED ORDER — LACTATED RINGERS IV SOLN
INTRAVENOUS | Status: DC
  Administered 2014-12-25: 13:00:00 via INTRAVENOUS

## 2014-12-25 MED ORDER — ACETAMINOPHEN 325 MG PO TABS: 650.0000 mg | ORAL_TABLET | ORAL | Status: DC | PRN

## 2014-12-25 MED ORDER — NALBUPHINE HCL 10 MG/ML IJ SOLN: 5.0000 mg | INTRAMUSCULAR | Status: DC | PRN

## 2014-12-25 MED ORDER — NALOXONE HCL 1 MG/ML IJ SOLN
1.0000 ug/kg/h | INTRAVENOUS | Status: DC | PRN
  Filled 2014-12-25: qty 2

## 2014-12-25 MED ORDER — ALBUTEROL SULFATE (2.5 MG/3ML) 0.083% IN NEBU: 3.0000 mL | INHALATION_SOLUTION | RESPIRATORY_TRACT | Status: DC | PRN

## 2014-12-25 MED ORDER — PHENYLEPHRINE 8 MG IN D5W 100 ML (0.08MG/ML) PREMIX OPTIME
INJECTION | INTRAVENOUS | Status: AC
  Filled 2014-12-25: qty 100

## 2014-12-25 MED ORDER — ONDANSETRON HCL 4 MG/2ML IJ SOLN: 4.0000 mg | Freq: Three times a day (TID) | INTRAMUSCULAR | Status: DC | PRN

## 2014-12-25 MED ORDER — DIPHENHYDRAMINE HCL 25 MG PO CAPS: 25.0000 mg | ORAL_CAPSULE | Freq: Four times a day (QID) | ORAL | Status: DC | PRN

## 2014-12-25 MED ORDER — FAMOTIDINE IN NACL 20-0.9 MG/50ML-% IV SOLN
20.0000 mg | Freq: Once | INTRAVENOUS | Status: AC
  Administered 2014-12-25: 20 mg via INTRAVENOUS

## 2014-12-25 MED ORDER — FENTANYL CITRATE (PF) 100 MCG/2ML IJ SOLN
25.0000 ug | INTRAMUSCULAR | Status: DC | PRN
  Administered 2014-12-25 (×3): 50 ug via INTRAVENOUS

## 2014-12-25 MED ORDER — FLEET ENEMA 7-19 GM/118ML RE ENEM: 1.0000 | ENEMA | Freq: Every day | RECTAL | Status: DC | PRN

## 2014-12-25 MED ORDER — FENTANYL CITRATE (PF) 100 MCG/2ML IJ SOLN
INTRAMUSCULAR | Status: AC
  Administered 2014-12-25: 50 ug via INTRAVENOUS
  Filled 2014-12-25: qty 2

## 2014-12-25 MED ORDER — PROMETHAZINE HCL 25 MG/ML IJ SOLN
INTRAMUSCULAR | Status: AC
  Filled 2014-12-25: qty 1

## 2014-12-25 MED ORDER — FAMOTIDINE IN NACL 20-0.9 MG/50ML-% IV SOLN
INTRAVENOUS | Status: AC
  Filled 2014-12-25: qty 50

## 2014-12-25 MED ORDER — PRENATAL MULTIVITAMIN CH
1.0000 | ORAL_TABLET | Freq: Every day | ORAL | Status: DC
  Administered 2014-12-25 – 2014-12-28 (×3): 1 via ORAL
  Filled 2014-12-25 (×4): qty 1

## 2014-12-25 MED ORDER — BISACODYL 10 MG RE SUPP: 10.0000 mg | Freq: Every day | RECTAL | Status: DC | PRN

## 2014-12-25 MED ORDER — BUPIVACAINE IN DEXTROSE 0.75-8.25 % IT SOLN
INTRATHECAL | Status: DC | PRN
  Administered 2014-12-25: 12 mg via INTRATHECAL

## 2014-12-25 MED ORDER — CEFAZOLIN SODIUM-DEXTROSE 2-3 GM-% IV SOLR
INTRAVENOUS | Status: AC
  Filled 2014-12-25: qty 50

## 2014-12-25 MED ORDER — SCOPOLAMINE 1 MG/3DAYS TD PT72
MEDICATED_PATCH | TRANSDERMAL | Status: AC
  Filled 2014-12-25: qty 1

## 2014-12-25 MED ORDER — METHYLERGONOVINE MALEATE 0.2 MG PO TABS: 0.2000 mg | ORAL_TABLET | ORAL | Status: DC | PRN

## 2014-12-25 MED ORDER — KETOROLAC TROMETHAMINE 30 MG/ML IJ SOLN
INTRAMUSCULAR | Status: AC
Start: 2014-12-25 — End: 2014-12-25
  Administered 2014-12-25: 30 mg via INTRAVENOUS
  Filled 2014-12-25: qty 1

## 2014-12-25 MED ORDER — FERROUS SULFATE 325 (65 FE) MG PO TABS
325.0000 mg | ORAL_TABLET | Freq: Two times a day (BID) | ORAL | Status: DC
  Administered 2014-12-25 – 2014-12-28 (×7): 325 mg via ORAL
  Filled 2014-12-25 (×7): qty 1

## 2014-12-25 MED ORDER — NALBUPHINE HCL 10 MG/ML IJ SOLN
5.0000 mg | INTRAMUSCULAR | Status: DC | PRN
  Administered 2014-12-25: 5 mg via INTRAVENOUS
  Filled 2014-12-25: qty 1

## 2014-12-25 SURGICAL SUPPLY — 36 items
BARRIER ADHS 3X4 INTERCEED (GAUZE/BANDAGES/DRESSINGS) ×2 IMPLANT
BENZOIN TINCTURE PRP APPL 2/3 (GAUZE/BANDAGES/DRESSINGS) IMPLANT
CLAMP CORD UMBIL (MISCELLANEOUS) IMPLANT
CLOTH BEACON ORANGE TIMEOUT ST (SAFETY) ×2 IMPLANT
CONTAINER PREFILL 10% NBF 15ML (MISCELLANEOUS) IMPLANT
DRAPE SHEET LG 3/4 BI-LAMINATE (DRAPES) IMPLANT
DRSG OPSITE POSTOP 4X10 (GAUZE/BANDAGES/DRESSINGS) ×2 IMPLANT
DURAPREP 26ML APPLICATOR (WOUND CARE) ×2 IMPLANT
ELECT REM PT RETURN 9FT ADLT (ELECTROSURGICAL) ×2
ELECTRODE REM PT RTRN 9FT ADLT (ELECTROSURGICAL) ×1 IMPLANT
EXTRACTOR VACUUM KIWI (MISCELLANEOUS) IMPLANT
EXTRACTOR VACUUM M CUP 4 TUBE (SUCTIONS) IMPLANT
GLOVE BIO SURGEON STRL SZ7 (GLOVE) ×2 IMPLANT
GLOVE BIOGEL PI IND STRL 7.0 (GLOVE) ×1 IMPLANT
GLOVE BIOGEL PI INDICATOR 7.0 (GLOVE) ×1
GOWN STRL REUS W/TWL LRG LVL3 (GOWN DISPOSABLE) ×4 IMPLANT
KIT ABG SYR 3ML LUER SLIP (SYRINGE) IMPLANT
NEEDLE HYPO 25X5/8 SAFETYGLIDE (NEEDLE) IMPLANT
NS IRRIG 1000ML POUR BTL (IV SOLUTION) ×2 IMPLANT
PACK C SECTION WH (CUSTOM PROCEDURE TRAY) ×2 IMPLANT
PAD OB MATERNITY 4.3X12.25 (PERSONAL CARE ITEMS) ×2 IMPLANT
RTRCTR C-SECT PINK 25CM LRG (MISCELLANEOUS) ×2 IMPLANT
STAPLER VISISTAT 35W (STAPLE) IMPLANT
STRIP CLOSURE SKIN 1/2X4 (GAUZE/BANDAGES/DRESSINGS) IMPLANT
SUT MON AB-0 CT1 36 (SUTURE) ×6 IMPLANT
SUT PLAIN 0 NONE (SUTURE) IMPLANT
SUT PLAIN 2 0 (SUTURE) ×2
SUT PLAIN ABS 2-0 CT1 27XMFL (SUTURE) ×2 IMPLANT
SUT VIC AB 0 CT1 27 (SUTURE) ×2
SUT VIC AB 0 CT1 27XBRD ANBCTR (SUTURE) ×2 IMPLANT
SUT VIC AB 2-0 CT1 27 (SUTURE) ×3
SUT VIC AB 2-0 CT1 TAPERPNT 27 (SUTURE) ×3 IMPLANT
SUT VIC AB 4-0 KS 27 (SUTURE) IMPLANT
SUT VICRYL 0 TIES 12 18 (SUTURE) IMPLANT
TOWEL OR 17X24 6PK STRL BLUE (TOWEL DISPOSABLE) ×2 IMPLANT
TRAY FOLEY CATH SILVER 14FR (SET/KITS/TRAYS/PACK) IMPLANT

## 2014-12-25 NOTE — Addendum Note (Signed)
Addendum  created 12/25/14 0840 by Earmon PhoenixValerie P Rochelle Larue, CRNA   Modules edited: Notes Section   Notes Section:  File: [id number]

## 2014-12-25 NOTE — Anesthesia Postprocedure Evaluation (Signed)
  Anesthesia Post-op Note  Patient: Brenda Valdez  Procedure(s) Performed: Procedure(s): CESAREAN SECTION (N/A)  Patient Location: Mother/Baby  Anesthesia Type:Epidural  Level of Consciousness: awake, alert , oriented and patient cooperative  Airway and Oxygen Therapy: Patient Spontanous Breathing  Post-op Pain: mild  Post-op Assessment: Patient's Cardiovascular Status Stable, Respiratory Function Stable, No signs of Nausea or vomiting, Pain level controlled, No headache, No backache, No residual numbness and No residual motor weakness  Post-op Vital Signs: stable  Last Vitals:  Filed Vitals:   12/25/14 0811  BP: 116/49  Pulse: 81  Temp: 37.1 C  Resp: 20    Complications: No apparent anesthesia complications

## 2014-12-25 NOTE — Anesthesia Postprocedure Evaluation (Signed)
  Anesthesia Post-op Note  Patient: Brenda Valdez  Procedure(s) Performed: Procedure(s): CESAREAN SECTION (N/A)  Patient Location: PACU  Anesthesia Type:Spinal  Level of Consciousness: awake  Airway and Oxygen Therapy: Patient Spontanous Breathing  Post-op Pain: mild  Post-op Assessment: Post-op Vital signs reviewed, Patient's Cardiovascular Status Stable, Respiratory Function Stable, Patent Airway, No signs of Nausea or vomiting and Pain level controlled  Post-op Vital Signs: Reviewed and stable  Last Vitals:  Filed Vitals:   12/25/14 0609  BP: 120/49  Pulse: 69  Temp: 37.2 C  Resp: 20    Complications: No apparent anesthesia complications

## 2014-12-25 NOTE — Anesthesia Preprocedure Evaluation (Addendum)
Anesthesia Evaluation  Patient identified by MRN, date of birth, ID band Patient awake    Reviewed: Allergy & Precautions, NPO status , Patient's Chart, lab work & pertinent test results  History of Anesthesia Complications Negative for: history of anesthetic complications  Airway Mallampati: II  TM Distance: >3 FB Neck ROM: Full    Dental  (+) Teeth Intact   Pulmonary asthma ,  breath sounds clear to auscultation- rhonchi        Cardiovascular negative cardio ROS  Rhythm:Regular     Neuro/Psych negative neurological ROS  negative psych ROS   GI/Hepatic negative GI ROS, Neg liver ROS,   Endo/Other  diabetes, Gestational, Oral Hypoglycemic Agents  Renal/GU negative Renal ROS     Musculoskeletal   Abdominal   Peds  Hematology negative hematology ROS (+)   Anesthesia Other Findings   Reproductive/Obstetrics (+) Pregnancy                            Anesthesia Physical Anesthesia Plan  ASA: II  Anesthesia Plan: Spinal   Post-op Pain Management:    Induction:   Airway Management Planned: Natural Airway  Additional Equipment: None  Intra-op Plan:   Post-operative Plan:   Informed Consent: I have reviewed the patients History and Physical, chart, labs and discussed the procedure including the risks, benefits and alternatives for the proposed anesthesia with the patient or authorized representative who has indicated his/her understanding and acceptance.   Dental advisory given  Plan Discussed with: CRNA and Surgeon  Anesthesia Plan Comments:        Anesthesia Quick Evaluation

## 2014-12-25 NOTE — Anesthesia Procedure Notes (Signed)
Spinal Patient location during procedure: OB Staffing Anesthesiologist: Greydis Stlouis, CHRIS Preanesthetic Checklist Completed: patient identified, surgical consent, pre-op evaluation, timeout performed, IV checked, risks and benefits discussed and monitors and equipment checked Spinal Block Patient position: sitting Prep: site prepped and draped and DuraPrep Patient monitoring: heart rate, cardiac monitor, continuous pulse ox and blood pressure Approach: midline Location: L3-4 Injection technique: single-shot Needle Needle type: Pencan  Needle gauge: 24 G Needle length: 10 cm Assessment Sensory level: T6   

## 2014-12-25 NOTE — Brief Op Note (Signed)
12/24/2014 - 12/25/2014  2:28 AM  PATIENT:  Brenda Valdez  29 y.o. female  PRE-OPERATIVE DIAGNOSIS:   Fetal Macrosomia, early labor, term gestation, Class A2 GDM  POST-OPERATIVE DIAGNOSIS:  Fetal  Macrosomia, early labor, term gestation, Class A2 GDM  PROCEDURE:  Primary Cesarean section, Sharl MaKerr hysterotomy  SURGEON:  Surgeon(s) and Role:       * Maxie BetterSheronette Consuello Lassalle, MD  PHYSICIAN ASSISTANT:   ASSISTANTS: Shea EvansVaishali Mody, MD   ANESTHESIA:   spinal FINDINGS; LIVE female LOT, compound hand presentation, Apgar 9/9, nl tubes and ovaries, 9lb. Copious light mec fluid EBL:  Total I/O In: 2300 [I.V.:2300] Out: 1050 [Urine:350; Blood:700]  BLOOD ADMINISTERED:none  DRAINS: none   LOCAL MEDICATIONS USED:  MARCAINE     SPECIMEN:  Source of Specimen:  placenta  DISPOSITION OF SPECIMEN:  N/A  COUNTS:  YES  TOURNIQUET:  * No tourniquets in log *  DICTATION: .Other Dictation: Dictation Number [id number]  PLAN OF CARE: Admit to inpatient   PATIENT DISPOSITION:  PACU - hemodynamically stable.   Delay start of Pharmacological VTE agent (>24hrs) due to surgical blood loss or risk of bleeding: no

## 2014-12-25 NOTE — Lactation Note (Signed)
This note was copied from the chart of Girl Mahala Menghinishley Bushee. Lactation Consultation Note  Patient Name: Girl Mahala Menghinishley Bari ZOXWR'UToday's Date: 12/25/2014 Reason for consult: Initial assessment;NICU baby  NICU baby 7141w2d, and 13 hours of life. Mom states that she has pumping twice today. Mom states that baby nursed once right after delivery and nursed well. Mom is seeing colostrum. Discussed normal progression of milk coming in, and enc mom to take/send any EBM she gets to baby in NICU. Mom given stickers and NICU booklet with review. Enc mom to pump 8 times a day and allow herself to sleep 5 hours at night if she can. Enc pumping more in the morning after waking up to catch baby up and get in 8 times/24 hours. Mom states that she is comfortable with hand expressing. Mom aware of OP/BFSG and LC phone line assistance after D/C. Enc mom to ask for assistance as needed. Parents state that they are about to go up to NICU to visit baby. Enc STS. Maternal Data Has patient been taught Hand Expression?: Yes (Per mom.) Does the patient have breastfeeding experience prior to this delivery?: No  Feeding Feeding Type: Formula Nipple Type: Regular Length of feed: 10 min  LATCH Score/Interventions                      Lactation Tools Discussed/Used Pump Review: Setup, frequency, and cleaning;Milk Storage Initiated by:: Bedside nurse. Date initiated:: 12/24/14   Consult Status Consult Status: Follow-up Date: 12/26/14 Follow-up type: In-patient    [name], Graison Leinberger 12/25/2014, 3:12 PM

## 2014-12-25 NOTE — H&P (Signed)
Brenda Valdez is a 29 y.o. female presenting for labor evaluation with c/o painful UCs since about 3 hours, denies leaking. Reports good FMs. S/p Stadol x 1 dose in MAU for pain control and it helped some but UCs are still regular and more painful per patient. Cx check notes 2 cm dilated but effaced 90% (LEEP hx, scarred band palpated) with BBOW, high station.  Will admit patient and proceed with C-section delivery as per plan due to macrosomia after extensive counseling by [name] in office (pt for scheduled C/s on 5/26). NPO x 6.30 pm recommend waiting 8 hours due to early labor to reduce maternal risk.   PNCare- [name].  A2GDM (not well controlled with high F and some post-meals) Glyburide  bid, ANtesting since 32 wks. Last sono 5/17 EFW 4175 gm at 97% with AC at 99%, after counseling on risks, informed decision was made for elective C-section delivery.  Stable Asthma with no exacerabation in pregnancy.  LEEP hx.  HSV hx, on Valtrex since 35 wks, no outbreaks BV and yeast infections treated in pregnancy.   History OB History    Gravida Para Term Preterm AB TAB SAB Ectopic Multiple Living   Past Medical History  Diagnosis Date  . No pertinent past medical history   . Asthma     excercise induced  . Gestational diabetes mellitus, antepartum   . Hyperlipidemia   . Vaginal Pap smear, abnormal   . Decreased fetal movement during pregnancy in third trimester, antepartum 12/05/2014  . Decreased fetal movement in pregnancy in third trimester, antepartum 12/23/2014  . GDM, class A2 12/25/2014   Past Surgical History  Procedure Laterality Date  . Laparoscopy  03/24/2011    Procedure: LAPAROSCOPY DIAGNOSTIC;  Surgeon: Meriel Pica;  Location: [facility];  Service: Gynecology;  Laterality: N/A;  . Leep  08/11/2012    Procedure: LOOP ELECTROSURGICAL EXCISION PROCEDURE (LEEP);  Surgeon: Meriel Pica, MD;  Location: [facility];  Service: Gynecology;  Laterality: N/A;  .  Wisdom tooth extraction    . Laser ablation condolamata N/A 06/16/2013    Procedure:  CO2 LASER ABLATION CONDOLAMATA;  Surgeon: Meriel Pica, MD;  Location: [facility];  Service: Gynecology;  Laterality: N/A;  CO2 LASER ABLATION OF VAIN & CIN   Family History: family history is not on file. Social History:  reports that she has never smoked. She has never used smokeless tobacco. She reports that she does not drink alcohol or use illicit drugs.   Prenatal Transfer Tool  Maternal Diabetes: Yes:  Diabetes Type:  Insulin/Medication controlled Glyburide  bid  Genetic Screening: Normal Ultrascreen and AFP1  Maternal Ultrasounds/Referrals: Normal anatomy, LGA by sono, Marginal/ velamentous cord   Fetal Ultrasounds or other Referrals:  None Maternal Substance Abuse:  No Significant Maternal Medications:  Meds include: Other: Glyburide  bid, Valtrex Significant Maternal Lab Results:  Lab values include: Group B Strep negative Other Comments:  None  ROS no SOB except with pain/ no HA/ vision changes.   Dilation: 2 Effacement (%): 100 Station: -3 Exam by:: [name], MD Blood pressure 127/73, pulse 83, height 5' 2.5" (1.588 m), weight 211 lb (95.709 kg), last menstrual period 03/25/2014, unknown if currently breastfeeding. Exam Physical Exam  A&O x 3, no acute distress. Pleasant HEENT neg, no thyromegaly Lungs CTA bilat CV RRR, S1S2 normal Abdo soft, non tender, non acute Extr no edema/ tenderness Pelvic above  FHT  140s-150s/ + accels, no decels, moderate variability- category I Toco q 3 minutes   Prenatal labs: ABO, Rh: AB/Positive/-- (10/16 0000) Antibody: Negative (10/16 0000) Rubella: Immune (10/16 0000) RPR: Nonreactive (10/16 0000)  HBsAg: Negative (10/16 0000)  HIV: Non-reactive (10/16 0000)  GBS:     Assessment/Plan: 29 yo, G2P0, 39.2 wks, early active labor. A2GDM. Suspected macrosomia. Needing analgesic due to labor pain. Proceed with C-section at 8 hrs NPO and  sooner if further cervical change noted.  Dr Cherly Hensenousins paged earlier and called me back, will update patient on her availability for C/section.   Risks/complications of surgery reviewed incl infection, bleeding, damage to internal organs including bladder, bowels, ureters, blood vessels, other risks from anesthesia, VTE and delayed complications of any surgery, complications in future surgery reviewed. Also discussed neonatal complications incl difficult delivery, laceration, vacuum assistance, TTN etc. Pt understands and agrees, all concerns addressed.     [name] 12/25/2014, 12:09 AM

## 2014-12-25 NOTE — Transfer of Care (Signed)
Immediate Anesthesia Transfer of Care Note  Patient: Brenda Valdez  Procedure(s) Performed: Procedure(s): CESAREAN SECTION (N/A)  Patient Location: PACU  Anesthesia Type:Spinal  Level of Consciousness: awake  Airway & Oxygen Therapy: Patient Spontanous Breathing  Post-op Assessment: Report given to RN and Post -op Vital signs reviewed and stable  Post vital signs: stable  Last Vitals:  Filed Vitals:   12/24/14 2100  BP: 127/73  Pulse: 83    Complications: No apparent anesthesia complications

## 2014-12-25 NOTE — Op Note (Signed)
[name]:  [name], [name]                 ACCOUNT NO.:  [id number]  MEDICAL RECORD NO.:  [id number]  LOCATION:  9105                          FACILITY:  [facility]  PHYSICIAN:  Maxie BetterSheronette Garrett Bowring, M.D.DATE OF BIRTH:  1986/03/12  DATE OF PROCEDURE:  12/25/2014 DATE OF DISCHARGE:                              OPERATIVE REPORT   PREOPERATIVE DIAGNOSES: 1. Early labor. 2. Fetal macrosomia. 3. Class A2 gestational diabetes. 4. Term gestation.  PROCEDURES: 1. Primary cesarean section, Kerr hysterotomy.  POSTOPERATIVE DIAGNOSES: 1. Fetal macrosomia. 2. Class A2 gestational diabetes. 3. Term gestation. 4. Early labor.  ANESTHESIA:  Spinal.  SURGEON:  Maxie BetterSheronette Sierra Spargo, MD.  ASSISTANDarryl Nestle:  Vaishaili Mody, MD.  DESCRIPTION OF PROCEDURE:  Under adequate spinal anesthesia, the patient was placed in the supine position with a left lateral tilt.  She was sterilely prepped and draped in the usual fashion.  Indwelling Foley catheter was sterilely placed.  A 0.25% Marcaine was injected along the planned Pfannenstiel skin incision site.  Pfannenstiel skin incision was then made, carried down to the rectus fascia.  Rectus fascia was opened transversely.  Rectus fascia was then bluntly and sharply dissected off the rectus muscle in superior and inferior fashion.  The rectus muscle was split in midline.  The parietal peritoneum was entered bluntly and extended.  Alexis self-retaining retractor was then placed. Vesicouterine peritoneum was opened transversely.  The bladder was gently dissected off the lower uterine segment.  A curvilinear low transverse uterine incision was then made and extended with bandage scissors.  Copious light meconium fluid was then noted, subsequent delivery of a live female from the left occiput transverse presentation with left arm, hand compound presentation was accomplished.  Baby was bulb suctioned in the abdomen.  Cord was clamped and cut.  The baby was transferred to the  awaiting pediatrician who assigned Apgars of 9 and 9 at one and five minutes.  The placenta was manually removed.  Uterine cavity was cleaned of debris.  Uterine incision had no extension, was closed in 2 layers, the first layer with 0 Monocryl running locked stitch, second layer was imbricated using 0 Monocryl suture.  The abdomen was then irrigated and suctioned of debris.  Normal tubes and ovaries were noted bilaterally.  Small paratubal cyst was noted on the left tube.  The Interceed was placed over the lower uterine segment in an inverted T fashion.  The Alexis retractor was removed.  The parietal peritoneum was closed with 2-0 Vicryl.  The rectus fascia was closed with 0 Vicryl x2.  The subcutaneous area was irrigated, small bleeders cauterized.  Interrupted 2-0 plain sutures placed, and the skin approximated using Ethicon staples.  SPECIMEN:  Placenta, not sent to Pathology.  ESTIMATED BLOOD LOSS:  700 mL.  INTRAOPERATIVE FLUID:  2300 mL.  URINE OUTPUT:  350 mL clear yellow urine.  COUNTS:  Sponge and instrument count x2 was correct.  COMPLICATIONS:  None.  The patient tolerated the procedure well, was transferred to recovery in stable condition.  The baby was placed skin to skin.     Maxie BetterSheronette Sharbel Sahagun, M.D.     [place]/MEDQ  D:  12/25/2014  T:  12/25/2014  Job:  [id number] 

## 2014-12-25 NOTE — Progress Notes (Signed)
POSTOPERATIVE DAY # 0 S/P cesarean section  S:         Reports feeling well - lots of itching but no pain             Tolerating po intake / no nausea / no vomiting              Bleeding is light             Pain controlled with long-acting narcotic med  O:  VS: BP 116/49 mmHg  Pulse 81  Temp(Src) 98.7 F (37.1 C) (Axillary)  Resp 20  Ht 5' 2.5" (1.588 m)  Wt 95.709 kg (211 lb)  BMI 37.95 kg/m2  SpO2 97%  LMP 03/25/2014  Breastfeeding? Unknown   LABS:               Recent Labs  12/25/14 0010  WBC 14.1*  HGB 10.7*  PLT 205               Bloodtype: AB/Positive/-- (10/16 0000)  Rubella: Immune (10/16 0000)                                             I&O: +1600             Physical Exam:             Alert and Oriented X3  Lungs: Clear and unlabored  Heart: regular rate and rhythm / no mumurs  Abdomen: soft, non-tender, non-distended             Fundus: firm, non-tender, Ueven             Dressing intact             Incision:  approximated with staples / no erythema / no ecchymosis / no drainage  Extremities: SCD in place  A:        POD # 0 S/P cesarean section          P:        Routine postoperative care    Marlinda MikeBAILEY, Danyle Boening CNM, MSN, [facility]FACNM 12/25/2014, 10:05 AM

## 2014-12-25 NOTE — Progress Notes (Signed)
Patient taken to NICU to see her infant per her request via wheelchair with father of the baby also.

## 2014-12-26 ENCOUNTER — Encounter (HOSPITAL_COMMUNITY): Payer: Self-pay | Admitting: Obstetrics & Gynecology

## 2014-12-26 ENCOUNTER — Inpatient Hospital Stay (HOSPITAL_COMMUNITY): Admission: RE | Admit: 2014-12-26 | Payer: 59 | Source: Ambulatory Visit | Admitting: Obstetrics and Gynecology

## 2014-12-26 LAB — CBC
HCT: 26.8 % — ABNORMAL LOW (ref 36.0–46.0)
Hemoglobin: 8.7 g/dL — ABNORMAL LOW (ref 12.0–15.0)
MCH: 27.4 pg (ref 26.0–34.0)
MCHC: 32.5 g/dL (ref 30.0–36.0)
MCV: 84.3 fL (ref 78.0–100.0)
Platelets: 165 10*3/uL (ref 150–400)
RBC: 3.18 MIL/uL — AB (ref 3.87–5.11)
RDW: 15.3 % (ref 11.5–15.5)
WBC: 12.8 10*3/uL — ABNORMAL HIGH (ref 4.0–10.5)

## 2014-12-26 SURGERY — Surgical Case
Anesthesia: Spinal

## 2014-12-26 MED ORDER — MAGNESIUM OXIDE 400 (241.3 MG) MG PO TABS
200.0000 mg | ORAL_TABLET | Freq: Every day | ORAL | Status: DC
  Administered 2014-12-26 – 2014-12-28 (×3): 200 mg via ORAL
  Filled 2014-12-26 (×4): qty 0.5

## 2014-12-26 NOTE — Lactation Note (Signed)
This note was copied from the chart of Brenda Mahala Menghinishley Mikes. Lactation Consultation Note  Patient Name: Brenda Valdez WGNFA'OToday's Date: 12/26/2014 Reason for consult: Follow-up assessment;NICU baby NICU baby 36 hours. Assisted mom in NICU to latch baby directly to left breast in football position. Assisted mom to place a rolled washcloth under her breast for support. Mom able to hand express colostrum, and after a few attempts, baby latched deeply, suckling rhythmically, with a few swallows noted. Demonstrated to parents how to stimulate baby to keep nursing. Mom able to place EBM on swabs into baby's mouth while at the breast as well. Mom states that she is comfortable with positioning. Enc mom to keep pumping 8 times a day, and hand express before and after pumping. Enc mom to offer STS and breast feeding as able. Discussed supply and demand. Enc mom to call for assistance as needed.  Maternal Data    Feeding Feeding Type: Breast Fed Nipple Type: Slow - flow Length of feed: 30 min  LATCH Score/Interventions Latch: Repeated attempts needed to sustain latch, nipple held in mouth throughout feeding, stimulation needed to elicit sucking reflex.  Audible Swallowing: A few with stimulation Intervention(s): Skin to skin;Hand expression  Type of Nipple: Everted at rest and after stimulation (Short shaft.)  Comfort (Breast/Nipple): Soft / non-tender     Hold (Positioning): Assistance needed to correctly position infant at breast and maintain latch.  LATCH Score: 7  Lactation Tools Discussed/Used     Consult Status Consult Status: Follow-up Date: 12/27/14 Follow-up type: In-patient    [name], [name] 12/26/2014, 2:14 PM

## 2014-12-26 NOTE — Progress Notes (Signed)
Patient ID: Brenda KusterAshley A Valdez, female   DOB: 11/28/1985, 29 y.o.   MRN: [id number] Subjective: POD# 1 Information for the patient's newborn:  Brenda Valdez, Brenda Valdez [[id number]][[id number]]  Female in NICU   Reports feeling well Feeding: bottle - pumped colostum Patient reports tolerating PO.  Breast symptoms: none Pain controlled with ibuprofen (OTC) and narcotic analgesics including Percocet Denies HA/SOB/C/P/N/V/dizziness. Some gas pain in chest. Flatus none, but belching. No BM. She reports vaginal bleeding as normal, without clots. Had a gush after breastfeeding  She is ambulating, urinating without difficult.     Objective:   VS:  Filed Vitals:   12/25/14 1415 12/25/14 1825 12/25/14 2115 12/26/14 0500  BP: 104/68 114/62 111/57 111/62  Pulse: 83 81 81 84  Temp: 98 F (36.7 C) 98.2 F (36.8 C) 97.6 F (36.4 C) 97.9 F (36.6 C)  TempSrc: Axillary Axillary Oral Oral  Resp: 18 16 18 18   Height:      Weight:      SpO2: 96% 97% 99% 99%     Intake/Output Summary (Last 24 hours) at 12/26/14 [id number] Last data filed at 12/26/14 0040  Gross per 24 hour  Intake  287.5 ml  Output   1600 ml  Net -1312.5 ml        Recent Labs  12/25/14 0010 12/26/14 0630  WBC 14.1* 12.8*  HGB 10.7* 8.7*  HCT 31.8* 26.8*  PLT 205 165     Blood type: AB/Positive/-- (10/16 0000)  Rubella: Immune (10/16 0000)     Physical Exam:  General: alert, cooperative and no distress CV: Regular rate and rhythm, S1S2 present or without murmur or extra heart sounds Resp: clear Abdomen: soft, nontender, normal bowel sounds Incision: Tegaderm and Honeycomb C/D/I / skin well-approximated with staples / no erythema, ecchymosis, edema, or drainage Uterine Fundus: firm, 1 FB below umbilicus, nontender Lochia: moderate Ext: extremities normal, atraumatic, no cyanosis, edema 2+ and Homans sign is negative, no sign of DVT   Assessment/Plan: 29 y.o.   POD# 1.  s/p Cesarean Delivery.  Indications: early labor, fetal macrosomia, GDM  Class A2                Principal Problem:   Postpartum care following cesarean delivery (5/24) Active Problems:   Macrosomia affecting management of mother   GDM, class A2  Doing well, stable.               Regular diet as tolerated Elevate BLE while sitting and lying Avoid carbonated drinks and straws / drink lots of warm liquids  Ambulate Routine post-op care  Kenard GowerAWSON, Aldonia Keeven, M, MSN, CNM 12/26/2014, 12:32 PM

## 2014-12-26 NOTE — Progress Notes (Addendum)
Patient ID: Trudee KusterAshley A Hudon, female   DOB: 04/09/1986, 29 y.o.   MRN: [id number] CNM in room to round on pt / pt not in room - in NICU. CNM will return later.  Raelyn MoraAWSON, Somya Jauregui, M MSN, CNM 12/26/2014 10:15 AM

## 2014-12-26 NOTE — Progress Notes (Signed)
CSW acknowledges NICU admission.    Patient screened out for psychosocial assessment at this time since none of the following apply:  Psychosocial stressors documented in mother or baby's chart  Gestation less than 32 weeks  Code at delivery   Critically ill infant  Infant with anomalies  Please contact the Clinical Social Worker if specific needs arise, or by MOB's request.

## 2014-12-26 NOTE — Progress Notes (Addendum)
Patient ID: Brenda KusterAshley A Valdez, female   DOB: 11/04/1985, 29 y.o.   MRN: [id number] Patient not in room again / in NICU breastfeeding baby - will come as soon as she is done.  CNM will come back after 12 pm.  Kenard GowerDAWSON, Qiana Landgrebe, M MSN, CNM 12/26/2014 11:30 AM

## 2014-12-27 LAB — GLUCOSE, CAPILLARY
GLUCOSE-CAPILLARY: 138 mg/dL — AB (ref 65–99)
Glucose-Capillary: 97 mg/dL (ref 65–99)

## 2014-12-27 NOTE — Lactation Note (Signed)
This note was copied from the chart of Brenda Mahala Menghinishley Bottino. Lactation Consultation Note  Patient Name: Brenda Valdez HQION'GToday's Date: 12/27/2014 Reason for consult: Follow-up assessment;NICU baby  NICU baby 62 hours. Assisted mom to latch baby to right breast in football position. Baby cueing to nurse and fussy/crying before mom arrived in NICU. Baby would not latch directly to breast so assisted mom to use SNS--5 [place] feeding tube/syringe, and baby took 35 mls of formula at the breast with SNS. Baby took an additional 20 mls of formula with bottle, and tolerated entire feeding well. Mom burped baby in several times throughout feed, and mom please with feeding as well. Enc mom to continue offering STS after feeding. Enc mom to continue pumping as well with the goal of replacing formula with her EBM. Enc mom to call for assistance as needed.  Maternal Data    Feeding Feeding Type: Breast Milk with Formula added  LATCH Score/Interventions Latch: Grasps breast easily, tongue down, lips flanged, rhythmical sucking. Intervention(s): Skin to skin Intervention(s): Adjust position;Assist with latch;Breast compression  Audible Swallowing: Spontaneous and intermittent Intervention(s): Skin to skin;Hand expression  Type of Nipple: Flat Intervention(s): No intervention needed  Comfort (Breast/Nipple): Soft / non-tender     Hold (Positioning): Assistance needed to correctly position infant at breast and maintain latch. Intervention(s): Support Pillows  LATCH Score: 8  Lactation Tools Discussed/Used Tools: 44F feeding tube / Syringe   Consult Status Consult Status: Follow-up Date: 12/28/14 Follow-up type: In-patient    Brenda Valdez, Brenda Valdez 12/27/2014, 4:18 PM

## 2014-12-27 NOTE — Progress Notes (Signed)
POSTOPERATIVE DAY # 2 S/P CS  Patient not in room for rounds - will revisit later Am to early afternoon  Marlinda MikeBAILEY, Skyra Crichlow CNM, MSN, [facility]ACNM 12/27/2014, 8:22 AM

## 2014-12-27 NOTE — Lactation Note (Signed)
This note was copied from the chart of Girl Eesha Schmaltz. Lactation Consultation Note  Patient Name: Girl Ayleah Hofmeister HKVQQ'V Date: 12/27/2014 Reason for consult: Follow-up assessment;NICU baby NICU baby 59 hours. Met with mom in her room on MBU. Mom concerned about the amount of colostrum she is able to pump/hand express. Discussed normal progression of milk coming in, and enc mom to keep pumping 8 times a day for 15 minutes and hand expressing before and after pumping. Mom about to take colostrum up to baby in NICU. Discussed LC assist with latching baby to the breast at the next feeding at 1445.  Maternal Data    Feeding Feeding Type: Formula Nipple Type: Slow - flow (yellow) Length of feed: 15 min  LATCH Score/Interventions                      Lactation Tools Discussed/Used     Consult Status Consult Status: Follow-up Date: 12/28/14 Follow-up type: In-patient    Inocente Salles 12/27/2014, 1:37 PM

## 2014-12-28 DIAGNOSIS — D62 Acute posthemorrhagic anemia: Secondary | ICD-10-CM | POA: Diagnosis not present

## 2014-12-28 DIAGNOSIS — O99019 Anemia complicating pregnancy, unspecified trimester: Secondary | ICD-10-CM

## 2014-12-28 DIAGNOSIS — D509 Iron deficiency anemia, unspecified: Secondary | ICD-10-CM | POA: Diagnosis present

## 2014-12-28 LAB — GLUCOSE, CAPILLARY: GLUCOSE-CAPILLARY: 107 mg/dL — AB (ref 65–99)

## 2014-12-28 LAB — GLUCOSE, RANDOM: Glucose, Bld: 112 mg/dL — ABNORMAL HIGH (ref 65–99)

## 2014-12-28 MED ORDER — IBUPROFEN 600 MG PO TABS: 600.0000 mg | ORAL_TABLET | Freq: Four times a day (QID) | ORAL | Status: DC

## 2014-12-28 MED ORDER — MAGNESIUM OXIDE 400 (241.3 MG) MG PO TABS: 200.0000 mg | ORAL_TABLET | Freq: Every day | ORAL | Status: DC

## 2014-12-28 MED ORDER — ONDANSETRON 4 MG PO TBDP
4.0000 mg | ORAL_TABLET | Freq: Once | ORAL | Status: AC
  Administered 2014-12-28: 4 mg via ORAL
  Filled 2014-12-28: qty 1

## 2014-12-28 MED ORDER — OXYCODONE-ACETAMINOPHEN 5-325 MG PO TABS: 1.0000 | ORAL_TABLET | ORAL | Status: DC | PRN

## 2014-12-28 NOTE — Progress Notes (Addendum)
POD # 3  Subjective: Pt reports feeling well/ Pain controlled with Motrin and Percocet Tolerating po/Voiding without problems/ Flatus present, +BM Episode of N/V this am-took Percocet on empty stomach Activity: ad lib Bleeding is light Newborn info:  Information for the patient's newborn:  [place] BaliDunn, Girl Della [[id number]][[id number]]  female Feeding: breast   Objective: VS: VS:  Filed Vitals:   12/26/14 0500 12/27/14 0610 12/27/14 1836 12/28/14 0613  BP: 111/62 115/53 137/80 101/75  Pulse: 84 89 76 64  Temp: 97.9 F (36.6 C) 98 F (36.7 C) 98.1 F (36.7 C) 98.1 F (36.7 C)  TempSrc: Oral Oral Axillary   Resp: 18 18 18 19   Height:      Weight:      SpO2: 99% 99%  99%    I&O: Intake/Output    None     LABS:  Recent Labs  12/26/14 0630  WBC 12.8*  HGB 8.7*  PLT 165   Blood type: AB/Positive/-- (10/16 0000) Rubella: Immune (10/16 0000)           Physical Exam:  General: alert and cooperative CV: Regular rate and rhythm Resp: CTA bilaterally Abdomen: soft, nontender, normal bowel sounds Incision: Covered with Tegaderm and honeycomb dressing; no significant drainage, edema, bruising, or erythema; well approximated with staples Uterine Fundus: firm, below umbilicus, nontender Lochia: minimal Ext: edema 1+ BLE and Homans sign is negative, no sign of DVT    Assessment: POD # 3/ G2P1011/ S/P C/Section d/t labor, planned PCS  IDA with compounding ABL anemia A2GDM, delivered Doing well and stable for discharge home  Plan: Zofran ODT x1 dose now Discharge home RX's: Ibuprofen 600mg  po Q 6 hrs prn pain #30 Refill x 1 Percocet 5/325 1 - 2 tabs po every 4 hrs prn pain #30 Refill x 0 Ferro-sequels 1 po bid (has at home) Mag Oxide 200 mg po daily #30, refill x1 Follow-up in 4 days for staple removal at [facility] Follow up in 6 wks for [facility] check [facility]    Signed: Donette LarryBHAMBRI, Ronella Plunk, Dorris CarnesN, MSN, CNM 12/28/2014, 9:14 AM

## 2014-12-28 NOTE — Discharge Summary (Signed)
DISCHARGE SUMMARY:  Patient ID: Brenda Valdez MRN: [id number] DOB/AGE: 28/04/1986 28 y.o.  Admit date: 12/24/2014 Admission Diagnoses: 39.[redacted] weeks gestation, labor, macrosomia for planned CS   Discharge date: 12/28/2014 Discharge Diagnoses: S/P C/S on 12/25/14; IDA with compounding ABL anemia; A2GDM, delivered        Prenatal history: G2P1011   EDC: 12/30/2014, by Last Menstrual Period  [facility]renatal care at [facility] since [redacted] wks gestation. Primary provider: Dr. Cherly Hensenousins Prenatal course complicated by A2GDM, hx LEEP, hx HSV, suspected fetal macrosomia  Prenatal labs: ABO, Rh: AB/Positive/-- (10/16 0000)  Antibody: Negative (10/16 0000) Rubella:   Immune RPR: Non Reactive (05/24 0010)  HBsAg: Negative (10/16 0000)  HIV: Non-reactive (10/16 0000)  GBS:   neg GTT: 202  Medical / Surgical History :  Past medical history:  Past Medical History  Diagnosis Date  . No pertinent past medical history   . Asthma     excercise induced  . Gestational diabetes mellitus, antepartum   . Hyperlipidemia   . Vaginal Pap smear, abnormal   . Decreased fetal movement during pregnancy in third trimester, antepartum 12/05/2014  . Decreased fetal movement in pregnancy in third trimester, antepartum 12/23/2014  . GDM, class A2 12/25/2014    Past surgical history:  Past Surgical History  Procedure Laterality Date  . Laparoscopy  03/24/2011    Procedure: LAPAROSCOPY DIAGNOSTIC;  Surgeon: Meriel Picaichard M Holland;  Location: [facility];  Service: Gynecology;  Laterality: N/A;  . Leep  08/11/2012    Procedure: LOOP ELECTROSURGICAL EXCISION PROCEDURE (LEEP);  Surgeon: Meriel Picaichard M Holland, MD;  Location: [facility];  Service: Gynecology;  Laterality: N/A;  . Wisdom tooth extraction    . Laser ablation condolamata N/A 06/16/2013    Procedure:  CO2 LASER ABLATION CONDOLAMATA;  Surgeon: Meriel Picaichard M Holland, MD;  Location: [facility];  Service: Gynecology;  Laterality: N/A;  CO2 LASER ABLATION OF VAIN & CIN  .  Cesarean section N/A 12/25/2014    Procedure: CESAREAN SECTION;  Surgeon: Shea EvansVaishali Mody, MD;  Location: [facility];  Service: Obstetrics;  Laterality: N/A;     Medications on Admission: Prescriptions prior to admission  Medication Sig Dispense Refill Last Dose  . ferrous sulfate 325 (65 FE) MG tablet Take 325 mg by mouth daily with breakfast.   Past Week at Unknown time  . glyBURIDE (DIABETA) 2.5 MG tablet Take 10 mg by mouth 2 (two) times daily with a meal. Pt takes 10mg  morning and night   12/24/2014 at Unknown time  . Prenatal Vit-Fe Fumarate-FA (PRENATAL MULTIVITAMIN) TABS tablet Take 1 tablet by mouth daily at 12 noon.   12/23/2014 at Unknown time  . valACYclovir (VALTREX) 500 MG tablet Take 1 tablet (500 mg total) by mouth daily. 30 tablet 6 12/23/2014 at Unknown time  . albuterol (PROVENTIL HFA;VENTOLIN HFA) 108 (90 BASE) MCG/ACT inhaler Inhale 2 puffs into the lungs every 6 (six) hours as needed. For wheezing   rescue    Allergies: Review of patient's allergies indicates no known allergies.   Intrapartum Course:  Admitted for primary CS under regional anesthesia.   Postpartum Course: Complicated by ABL anemia.  Physical Exam:   VSS: Blood pressure 101/75, pulse 64, temperature 98.1 F (36.7 C), temperature source Axillary, resp. rate 19, height 5' 2.5" (1.588 m), weight 95.709 kg (211 lb), last menstrual period 03/25/2014, SpO2 99 %, unknown if currently breastfeeding.  LABS:  Recent Labs  12/26/14 0630  WBC 12.8*  HGB 8.7*  PLT 165  General: alert and oriented x3 Heart: RRR Lungs: CTA bilaterally GI: soft, non-tender, non-distended, BS x4 Lochia: small Uterus: firm below umbilicus Incision: well approximated with staples; honeycomb dressing-no significant erythema, drainage, or edema Extremities: 1+ BLE edema, Homans neg   Newborn Data Live born female  Birth Weight: 8 lb 14.7 oz (4046 g) APGAR: 9, 9  See operative report for further details  NICU-stable  status  Discharge Instructions:  Wound Care: keep clean and dry / remove honeycomb POD 6 Postpartum Instructions: [facility] discharge booklet - instructions reviewed Medications:    Medication List    STOP taking these medications        ferrous sulfate 325 (65 FE) MG tablet     glyBURIDE 2.5 MG tablet  Commonly known as:  DIABETA     valACYclovir 500 MG tablet  Commonly known as:  VALTREX      TAKE these medications        albuterol 108 (90 BASE) MCG/ACT inhaler  Commonly known as:  PROVENTIL HFA;VENTOLIN HFA  Inhale 2 puffs into the lungs every 6 (six) hours as needed. For wheezing     ibuprofen 600 MG tablet  Commonly known as:  ADVIL,MOTRIN  Take 1 tablet (600 mg total) by mouth every 6 (six) hours.     magnesium oxide 400 (241.3 MG) MG tablet  Commonly known as:  MAG-OX  Take 0.5 tablets (200 mg total) by mouth daily.     oxyCODONE-acetaminophen 5-325 MG per tablet  Commonly known as:  PERCOCET/ROXICET  Take 1-2 tablets by mouth every 4 (four) hours as needed (for pain scale greater than 7).     prenatal multivitamin Tabs tablet  Take 1 tablet by mouth daily at 12 noon.            Follow-up Information    Follow up with COUSINS,SHERONETTE A, MD. Schedule an appointment as soon as possible for a visit in 4 days.   Specialty:  Obstetrics and Gynecology   Why:  staple removal with RN   Contact information:   [address] [place] [address] [phone]       Follow up with COUSINS,SHERONETTE A, MD. Go in 6 weeks.   Specialty:  Obstetrics and Gynecology   Contact information:   [address] [place] [address] [phone]         Signed: Donette Larry, Dorris Carnes MSN, CNM 12/28/2014, 9:20 AM

## 2014-12-28 NOTE — Progress Notes (Signed)
Patient called me to the room complaining of pain in Left Leg including the calf area; (-) homans, and no increased tenderness when palpated.  Patient describes it as sharp shooting pain.  Bp 130/74; heart rate 98.  Temp 98.2.  Midwife called; gave the ok for patient to be discharged, stated it was muscular and to encourage her to place heat on the area and do some stretching.  Will Continue to Monitor.  Vivi MartensAshley Jazzlin Clements RN

## 2015-02-12 ENCOUNTER — Ambulatory Visit (HOSPITAL_COMMUNITY)
Admission: RE | Admit: 2015-02-12 | Discharge: 2015-02-12 | Disposition: A | Discharge status: Home / Self Care | Payer: 59 | Source: Ambulatory Visit | Attending: Obstetrics and Gynecology | Admitting: Obstetrics and Gynecology

## 2015-02-12 NOTE — Lactation Note (Signed)
Lactation Consult  Initial consult. Baby's Name: Brenda Valdez Date of Birth: 12/25/2013 Pediatrician: Brenda Valdez Gender: female Gestational Age: [redacted]w[redacted]d (At Birth) Birth Weight: 8 lb 14.7 oz (4046 g) Weight at Discharge: Weight: 8 lb 11.2 oz (3945 g)Date of Discharge: 12/29/2014 [facility] Weights   12/27/14 0000 12/28/14 0407 12/28/14 1415  Weight: 8 lb 14.2 oz (4030 g) 8 lb 15.9 oz (4080 g) 8 lb 11.2 oz (3945 g)   [facility]: 11# Location:Pediatrician's office Weight today: 11#3 oz   Mother's reason for visit:  Pain with pumping and latching both during the feeding and after the feeding. Visit Type:  OP  Appointment Notes:    Brenda Valdez is here to day with 64 week old Brenda Valdez.  Mom has had pain both with latching and pumping for 7 weeks.  She also reports deep pain in the left breast.  Nodules can be palpated that are very deep.  Brenda Valdez breastfeeds 1-2 times in 24 hours and the balance of her feedings are 2oz EBM in a bottle.  Mom reports that recently Brenda Valdez is not feeding well at the breast.  Mom only BF on the rt breast because the latch is too painful on the left side.  Both nipples are pink and itchy.  Mom also has internal Breast pain. Brenda Valdez has been started on Nystatin for thrush and mom will start Diflucan today.  I also recommended that she ask her OB for All Purpose Nipple Ointment.  Suck evaluation:Sucking blisters are noted on both lips.  This is an indication that she is using her lips to create a vacuum verses her intraoral muscles.  Initially, Brenda Valdez had difficulty grasping my gloved finger.  Once  she grasped it an anterior bubble palate was felt. Her tone was tight posteriorly.  The longer she sucked the more relaxed it became. An upper labial frenum is noted that limits her flanging the out corners of her lip.  Sublingually a very thin band of tissue is noted posteriorly with manual exam. Snapback is  heard both on a gloved finger and also at the breast.  This is suggestive of oral restriction.   I did tummy time with her and gently moved her head from left to right to help her stretch her SCM muscles.  She was very relaxed during this.  I also performed jaw massage.   F eeding: Brenda Valdez latched to the right breast in a football hold using an off center latch.  I showed mom how to "scrunch" the areola to increase the depth of the latch.  Mom reported a pain of a 5 but adjusting  baby's alignment and pulling her in very close pain decreased to a 2.  She transferred 40ml.  She was re-postioned in a cross cradle on the same side and transferred another 20.  Pumping:  Mom's flange size was increased from a #24 to a #30.  She still had pain but it was less with the increased size.  She report nipple pain internally.  This could be a combination of the thrush and the fact that she has been pumping for 30 minutes at a time.  I encouraged her to decrease pumping time to 20 minutes and to increase the frequency from every 3-4 hours to every 2.5 hours.  This will help her pain in addition to increasing her supply which she reports is decreasing. Resources were given to her if she desired to learn more about oral restrictions.  [email or web address], Doy MinceLuna Lactation, Dr. Orland MustardMcMurtry and Dr. Margaretha Sheffieldraper. Consult:  Initial Lactation Consultant:  Soyla DryerJoseph, Arayah Krouse  ________________________________________________________________________         ________________________________________________________________________  Mother's Name: Brenda KusterAshley A Valdez Voids:  6-8 Stools:  4-5 in 24 hrs.  Color:  Green and Yellow  ________________________________________________________________________

## 2016-07-13 ENCOUNTER — Encounter: Payer: Self-pay | Admitting: Dietician

## 2016-07-13 ENCOUNTER — Encounter: Payer: 59 | Attending: Obstetrics and Gynecology | Admitting: Dietician

## 2016-07-13 DIAGNOSIS — R7309 Other abnormal glucose: Secondary | ICD-10-CM | POA: Diagnosis not present

## 2016-07-13 DIAGNOSIS — R7303 Prediabetes: Secondary | ICD-10-CM

## 2016-07-13 DIAGNOSIS — Z713 Dietary counseling and surveillance: Secondary | ICD-10-CM | POA: Insufficient documentation

## 2016-07-13 NOTE — Progress Notes (Signed)
  Medical Nutrition Therapy:  Appt start time: 15005end time:  1450.   Assessment:  Primary concerns today: Patient is here alone.  She has a hx of GDM and is concerned due to increasing blood sugar again with an A1C of 6.3% 06/04/16.  Other hx includes vitamin D deficiency and hyperlipidemia.  She would also like to lose weight.  Weight hx: 202 lbs today 125 lbs 9 years ago 202 lbs highest adult weight now. Lost to 170 lbs after birth and nursing.  Patient lives with her 3818 month ol daughter, fiance and his 3 children.  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  Preferred Learning Style:   No preference indicated   Learning Readiness:   Ready  MEDICATIONS: see list   DIETARY INTAKE:  Usual eating pattern includes 2 meals and 0 snacks per day. Avoided foods include few vegetables (no purchase or prep time).    24-hr recall:  B ( AM): sometimes skips OR yogurt and fruit OR biscuit OR boiled egg and yogurt  Snk ( AM): none  L ( PM): increased eating out OR has starting to take chicken or other meat and vegetable or meat and salad OR tuna and crackers Snk ( PM): none D ( PM): chicken alone OR hamberger helper OR Snk ( PM): none Beverages: sweet tea, water, occasional juice, occasional regular soda  Usual physical activity: none (no time), just purchased kick box classes and has memberships to [employer] and just [employer] beach body work out video.       Estimated energy needs: 1400 calories 158 g carbohydrates 105 g protein 39 g fat  Progress Towards Goal(s):  In progress.   Nutritional Diagnosis:  NB-1.1 Food and nutrition-related knowledge deficit As related to balance of carbohydrate, protein, and fat.  As evidenced by diet hx.    Intervention:  Nutrition counseling and diabetes education initiated. Discussed Carb Counting by food group as method of portion control, reading food labels, and benefits of increased  activity. Also discussed basic physiology of Diabetes/prediabetes , target BG ranges pre and post meals, and A1c.   Rethink what you drink. Add activity to your day.  Aim for 30 minutes most days. Avoid skipping meals.  Include Breakfast, lunch, dinner daily. Increasing your non starchy vegetable intake.  Aim for 3 Carb Choices per meal (45 grams) +/- 1 either way  Aim for 0-1 Carbs per snack if hungry  Include protein in moderation with your meals and snacks Consider reading food labels for Total Carbohydrate and Fat Grams of foods Consider checking BG at alternate times per day as directed by MD   Teaching Method Utilized:  Visual Auditory Hands on  Handouts given during visit include: Living Well with Diabetes Food Label handouts Meal Plan Card  My plate  Label reading  A1C sheet  Barriers to learning/adherence to lifestyle change: busy life  Demonstrated degree of understanding via:  Teach Back   Monitoring/Evaluation:  Dietary intake, exercise, label reading, and body weight prn.

## 2016-07-13 NOTE — Patient Instructions (Signed)
Rethink what you drink. Add activity to your day.  Aim for 30 minutes most days. Avoid skipping meals.  Include Breakfast, lunch, dinner daily. Increasing your non starchy vegetable intake.  Aim for 3 Carb Choices per meal (45 grams) +/- 1 either way  Aim for 0-1 Carbs per snack if hungry  Include protein in moderation with your meals and snacks Consider reading food labels for Total Carbohydrate and Fat Grams of foods Consider checking BG at alternate times per day as directed by MD

## 2017-09-10 ENCOUNTER — Ambulatory Visit: Payer: 59 | Admitting: Primary Care

## 2017-09-10 ENCOUNTER — Encounter: Payer: Self-pay | Admitting: Primary Care

## 2017-09-10 ENCOUNTER — Encounter (INDEPENDENT_AMBULATORY_CARE_PROVIDER_SITE_OTHER): Payer: Self-pay

## 2017-09-10 ENCOUNTER — Other Ambulatory Visit: Payer: Self-pay | Admitting: Primary Care

## 2017-09-10 VITALS — BP 116/70 | HR 71 | Temp 98.2°F | Ht 62.5 in | Wt 191.0 lb

## 2017-09-10 DIAGNOSIS — E785 Hyperlipidemia, unspecified: Secondary | ICD-10-CM

## 2017-09-10 DIAGNOSIS — R0789 Other chest pain: Secondary | ICD-10-CM

## 2017-09-10 DIAGNOSIS — J4520 Mild intermittent asthma, uncomplicated: Secondary | ICD-10-CM | POA: Diagnosis not present

## 2017-09-10 DIAGNOSIS — G935 Compression of brain: Secondary | ICD-10-CM | POA: Diagnosis not present

## 2017-09-10 DIAGNOSIS — A6000 Herpesviral infection of urogenital system, unspecified: Secondary | ICD-10-CM | POA: Diagnosis not present

## 2017-09-10 DIAGNOSIS — R739 Hyperglycemia, unspecified: Secondary | ICD-10-CM

## 2017-09-10 HISTORY — DX: Other chest pain: R07.89

## 2017-09-10 LAB — LIPID PANEL
Cholesterol: 192 mg/dL (ref 0–200)
HDL: 30.3 mg/dL — AB (ref >39.00)
LDL Cholesterol: 146 mg/dL — ABNORMAL HIGH (ref 0–99)
NonHDL: 162.04
Total CHOL/HDL Ratio: 6
Triglycerides: 80 mg/dL (ref 0.0–149.0)
VLDL: 16 mg/dL (ref 0.0–40.0)

## 2017-09-10 LAB — COMPREHENSIVE METABOLIC PANEL
ALT: 16 U/L (ref 0–35)
AST: 41 U/L — ABNORMAL HIGH (ref 0–37)
Albumin: 4.2 g/dL (ref 3.5–5.2)
Alkaline Phosphatase: 42 U/L (ref 39–117)
BILIRUBIN TOTAL: 0.3 mg/dL (ref 0.2–1.2)
BUN: 11 mg/dL (ref 6–23)
CO2: 27 mEq/L (ref 19–32)
Calcium: 9.2 mg/dL (ref 8.4–10.5)
Chloride: 105 mEq/L (ref 96–112)
Creatinine, Ser: 0.71 mg/dL (ref 0.40–1.20)
GFR: 123.96 mL/min (ref >60.00)
Glucose, Bld: 143 mg/dL — ABNORMAL HIGH (ref 70–99)
Potassium: 4 mEq/L (ref 3.5–5.1)
Sodium: 136 mEq/L (ref 135–145)
Total Protein: 7.5 g/dL (ref 6.0–8.3)

## 2017-09-10 MED ORDER — VALACYCLOVIR HCL 500 MG PO TABS: ORAL_TABLET | ORAL | 1 refills | Status: DC

## 2017-09-10 MED ORDER — ALBUTEROL SULFATE HFA 108 (90 BASE) MCG/ACT IN AERS: 2.0000 | INHALATION_SPRAY | Freq: Four times a day (QID) | RESPIRATORY_TRACT | 0 refills | Status: DC | PRN

## 2017-09-10 NOTE — Assessment & Plan Note (Signed)
Diagnosed in 2017, no recent follow up. Referral placed for local neurologist as requested. Exam today unremarkable.
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
## 2017-09-10 NOTE — Patient Instructions (Signed)
Stop by the lab prior to leaving today. I will notify you of your results once received.   You will be contacted regarding your referral to Neurology.  Please let us know if you have not been contacted within one week.   I sent refills for Valtrex and albuterol to your pharmacy.  Start exercising. You should be getting 150 minutes of moderate intensity exercise weekly.  It's important to improve your diet by reducing consumption of fast food, fried food, processed snack foods, sugary drinks. Increase consumption of fresh vegetables and fruits, whole grains, water.  Ensure you are drinking 64 ounces of water daily.  It was a pleasure to meet you today! Please don't hesitate to call or message me with any questions. Welcome to [facility]!

## 2017-09-10 NOTE — Assessment & Plan Note (Signed)
Uses albuterol infrequently with seasonal changes, refill provided today. No wheezing on exam.

## 2017-09-10 NOTE — Assessment & Plan Note (Signed)
Infrequent outbreaks, discussed to notify if outbreaks become recurrent. Refill sent to pharmacy.

## 2017-09-10 NOTE — Assessment & Plan Note (Signed)
Doubt cardiac cause, most likely secondary to MSK or anxiety. ECG today with NSR rate of 71. T-wave inversion to V2 which is different from 2010. Strongly recommended regular exercise and weight loss. Check lipids today.

## 2017-09-10 NOTE — Assessment & Plan Note (Signed)
Endorses long history of, lipids pending today. Discussed the importance of a healthy diet and regular exercise in order for weight loss, and to reduce the risk of any potential medical problems.

## 2017-09-15 ENCOUNTER — Ambulatory Visit: Payer: Self-pay | Admitting: Neurology

## 2017-10-07 ENCOUNTER — Emergency Department
Admission: EM | Admit: 2017-10-07 | Discharge: 2017-10-07 | Disposition: A | Discharge status: Home / Self Care | Payer: 59 | Attending: Emergency Medicine | Admitting: Emergency Medicine

## 2017-10-07 ENCOUNTER — Encounter: Payer: Self-pay | Admitting: Emergency Medicine

## 2017-10-07 ENCOUNTER — Other Ambulatory Visit: Payer: Self-pay

## 2017-10-07 ENCOUNTER — Emergency Department: Payer: 59

## 2017-10-07 DIAGNOSIS — N390 Urinary tract infection, site not specified: Secondary | ICD-10-CM | POA: Diagnosis not present

## 2017-10-07 DIAGNOSIS — Z79899 Other long term (current) drug therapy: Secondary | ICD-10-CM | POA: Diagnosis not present

## 2017-10-07 DIAGNOSIS — R1013 Epigastric pain: Secondary | ICD-10-CM

## 2017-10-07 DIAGNOSIS — R1012 Left upper quadrant pain: Secondary | ICD-10-CM | POA: Diagnosis present

## 2017-10-07 DIAGNOSIS — J4520 Mild intermittent asthma, uncomplicated: Secondary | ICD-10-CM | POA: Insufficient documentation

## 2017-10-07 DIAGNOSIS — R112 Nausea with vomiting, unspecified: Secondary | ICD-10-CM | POA: Insufficient documentation

## 2017-10-07 LAB — CBC
HEMATOCRIT: 38.9 % (ref 35.0–47.0)
HEMOGLOBIN: 12.2 g/dL (ref 12.0–16.0)
MCH: 25.8 pg — AB (ref 26.0–34.0)
MCHC: 31.4 g/dL — AB (ref 32.0–36.0)
MCV: 82.4 fL (ref 80.0–100.0)
Platelets: 298 10*3/uL (ref 150–440)
RBC: 4.72 MIL/uL (ref 3.80–5.20)
RDW: 14.2 % (ref 11.5–14.5)
WBC: 14.7 10*3/uL — ABNORMAL HIGH (ref 3.6–11.0)

## 2017-10-07 LAB — POCT PREGNANCY, URINE: Preg Test, Ur: NEGATIVE

## 2017-10-07 LAB — URINALYSIS, COMPLETE (UACMP) WITH MICROSCOPIC
Bacteria, UA: NONE SEEN
Bilirubin Urine: NEGATIVE
Glucose, UA: NEGATIVE mg/dL
KETONES UR: NEGATIVE mg/dL
Nitrite: NEGATIVE
PROTEIN: NEGATIVE mg/dL
Specific Gravity, Urine: 1.018 (ref 1.005–1.030)
pH: 5 (ref 5.0–8.0)

## 2017-10-07 LAB — COMPREHENSIVE METABOLIC PANEL
ALBUMIN: 4.3 g/dL (ref 3.5–5.0)
ALT: 14 U/L (ref 14–54)
AST: 23 U/L (ref 15–41)
Alkaline Phosphatase: 48 U/L (ref 38–126)
Anion gap: 10 (ref 5–15)
BUN: 12 mg/dL (ref 6–20)
CO2: 22 mmol/L (ref 22–32)
Calcium: 9.4 mg/dL (ref 8.9–10.3)
Chloride: 107 mmol/L (ref 101–111)
Creatinine, Ser: 0.75 mg/dL (ref 0.44–1.00)
GFR calc Af Amer: >60 mL/min (ref >60)
GFR calc non Af Amer: >60 mL/min (ref >60)
GLUCOSE: 141 mg/dL — AB (ref 65–99)
POTASSIUM: 3.6 mmol/L (ref 3.5–5.1)
SODIUM: 139 mmol/L (ref 135–145)
TOTAL PROTEIN: 8.4 g/dL — AB (ref 6.5–8.1)
Total Bilirubin: 0.8 mg/dL (ref 0.3–1.2)

## 2017-10-07 LAB — TROPONIN I

## 2017-10-07 LAB — LIPASE, BLOOD: Lipase: 33 U/L (ref 11–51)

## 2017-10-07 MED ORDER — ONDANSETRON HCL 4 MG/2ML IJ SOLN
4.0000 mg | Freq: Once | INTRAMUSCULAR | Status: AC
Start: 2017-10-07 — End: 2017-10-07
  Administered 2017-10-07: 4 mg via INTRAVENOUS
  Filled 2017-10-07: qty 2

## 2017-10-07 MED ORDER — DICYCLOMINE HCL 10 MG/ML IM SOLN
20.0000 mg | Freq: Once | INTRAMUSCULAR | Status: DC
Start: 2017-10-07 — End: 2017-10-07
  Filled 2017-10-07: qty 2

## 2017-10-07 MED ORDER — ONDANSETRON HCL 4 MG/2ML IJ SOLN
4.0000 mg | Freq: Once | INTRAMUSCULAR | Status: AC
  Administered 2017-10-07: 4 mg via INTRAVENOUS
  Filled 2017-10-07: qty 2

## 2017-10-07 MED ORDER — CEPHALEXIN 500 MG PO CAPS: 500.0000 mg | ORAL_CAPSULE | Freq: Three times a day (TID) | ORAL | 0 refills | Status: DC

## 2017-10-07 MED ORDER — DICYCLOMINE HCL 10 MG PO CAPS
20.0000 mg | ORAL_CAPSULE | Freq: Once | ORAL | Status: AC
  Administered 2017-10-07: 20 mg via ORAL
  Filled 2017-10-07: qty 2

## 2017-10-07 MED ORDER — ONDANSETRON HCL 4 MG/2ML IJ SOLN
INTRAMUSCULAR | Status: AC
  Filled 2017-10-07: qty 2

## 2017-10-07 MED ORDER — DICYCLOMINE HCL 20 MG PO TABS: 20.0000 mg | ORAL_TABLET | Freq: Four times a day (QID) | ORAL | 0 refills | Status: DC | PRN

## 2017-10-07 MED ORDER — MORPHINE SULFATE (PF) 4 MG/ML IV SOLN
INTRAVENOUS | Status: AC
  Filled 2017-10-07: qty 1

## 2017-10-07 MED ORDER — FAMOTIDINE IN NACL 20-0.9 MG/50ML-% IV SOLN
20.0000 mg | Freq: Once | INTRAVENOUS | Status: AC
  Administered 2017-10-07: 20 mg via INTRAVENOUS
  Filled 2017-10-07: qty 50

## 2017-10-07 MED ORDER — SODIUM CHLORIDE 0.9 % IV BOLUS (SEPSIS)
1000.0000 mL | Freq: Once | INTRAVENOUS | Status: AC
  Administered 2017-10-07: 1000 mL via INTRAVENOUS

## 2017-10-07 MED ORDER — ONDANSETRON HCL 4 MG/2ML IJ SOLN
4.0000 mg | Freq: Once | INTRAMUSCULAR | Status: AC
  Administered 2017-10-07: 4 mg via INTRAVENOUS

## 2017-10-07 MED ORDER — FAMOTIDINE 20 MG PO TABS: 20.0000 mg | ORAL_TABLET | Freq: Two times a day (BID) | ORAL | 0 refills | Status: DC

## 2017-10-07 MED ORDER — MORPHINE SULFATE (PF) 4 MG/ML IV SOLN
4.0000 mg | Freq: Once | INTRAVENOUS | Status: AC
  Administered 2017-10-07: 1 mg via INTRAVENOUS

## 2017-10-07 MED ORDER — IOPAMIDOL (ISOVUE-300) INJECTION 61%
15.0000 mL | INTRAVENOUS | Status: AC
  Administered 2017-10-07: 30 mL via ORAL

## 2017-10-07 MED ORDER — FENTANYL CITRATE (PF) 100 MCG/2ML IJ SOLN
50.0000 ug | Freq: Once | INTRAMUSCULAR | Status: AC
  Administered 2017-10-07: 50 ug via INTRAVENOUS
  Filled 2017-10-07: qty 2

## 2017-10-07 MED ORDER — IOPAMIDOL (ISOVUE-300) INJECTION 61%
100.0000 mL | Freq: Once | INTRAVENOUS | Status: AC | PRN
  Administered 2017-10-07: 100 mL via INTRAVENOUS

## 2017-10-07 MED ORDER — ONDANSETRON 4 MG PO TBDP: 4.0000 mg | ORAL_TABLET | Freq: Three times a day (TID) | ORAL | 0 refills | Status: DC | PRN

## 2017-10-07 NOTE — ED Notes (Signed)
Dr. Sung at the bedside °

## 2017-10-07 NOTE — Discharge Instructions (Addendum)
1.  Take antibiotic as prescribed (Keflex 500 mg 3 times daily for 7 days). 2.  Start Pepcid 20 mg twice daily. 3.  You may take medicines as needed for nausea and abdominal discomfort (Zofran/Bentyl #20). 4.  Return to the ER for worsening symptoms, persistent vomiting, difficulty breathing or other concerns.

## 2017-10-07 NOTE — ED Triage Notes (Addendum)
Pt to triage via w/c, appears uncomfortable, grimacing; pt reports onset N/V, left upper abd pain radiating into back; denies hx of same; pt actively vomiting

## 2017-10-07 NOTE — ED Notes (Signed)
Pt up to restroom with steady gait.

## 2017-10-07 NOTE — ED Provider Notes (Signed)
CT is negative, patient is cleared for outpatient follow up.   Emily FilbertWilliams, Jonathan E, MD 10/07/17 [phone]

## 2017-10-07 NOTE — ED Notes (Signed)
Pt to ultrasound

## 2017-10-07 NOTE — ED Provider Notes (Signed)
[facility] Emergency Department Provider Note   ____________________________________________   First MD Initiated Contact with Patient 10/07/17 0153     (approximate)  I have reviewed the triage vital signs and the nursing notes.   HISTORY  Chief Complaint Abdominal Pain    HPI Brenda Valdez is a 32 y.o. female who presents to the ED from home with a chief complaint of abdominal pain, nausea and vomiting.  Patient has a history of IBS who reports eating chicken and spinach at 4 PM.  Approximately 11 PM patient had onset of left upper quadrant abdominal pain radiating to her epigastrium associated with several episodes of nausea and vomiting.  She also had a small bowel movement at that time.  Denies recent fever, chills, chest pain, shortness of breath, dysuria, diarrhea.  Denies recent travel or trauma.   Past Medical History:  Diagnosis Date  . ADHD   . Asthma    excercise induced  . Chiari I malformation (HCC)   . Decreased fetal movement during pregnancy in third trimester, antepartum 12/05/2014  . Decreased fetal movement in pregnancy in third trimester, antepartum 12/23/2014  . GDM, class A2 12/25/2014  . Gestational diabetes mellitus, antepartum   . Hyperlipidemia   . IBS (irritable bowel syndrome)   . No pertinent past medical history   . Vaginal Pap smear, abnormal     Patient Active Problem List   Diagnosis Date Noted  . Mild intermittent asthma without complication 09/10/2017  . Genital herpes simplex 09/10/2017  . Chiari I malformation (HCC) 09/10/2017  . Hyperlipidemia 09/10/2017  . Chest discomfort 09/10/2017    Past Surgical History:  Procedure Laterality Date  . CESAREAN SECTION N/A 12/25/2014   Procedure: CESAREAN SECTION;  Surgeon: Shea Evans, MD;  Location: [facility];  Service: Obstetrics;  Laterality: N/A;  . LAPAROSCOPY  03/24/2011   Procedure: LAPAROSCOPY DIAGNOSTIC;  Surgeon: Meriel Pica;  Location: [facility];   Service: Gynecology;  Laterality: N/A;  . 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 LEEP  08/11/2012   Procedure: LOOP ELECTROSURGICAL EXCISION PROCEDURE (LEEP);  Surgeon: Meriel Pica, MD;  Location: [facility];  Service: Gynecology;  Laterality: N/A;  . WISDOM TOOTH EXTRACTION      Prior to Admission medications   Medication Sig Start Date End Date Taking? Authorizing Provider  albuterol (PROVENTIL HFA;VENTOLIN HFA) 108 (90 Base) MCG/ACT inhaler Inhale 2 puffs into the lungs every 6 (six) hours as needed. For wheezing 09/10/17   [name], NP  cephALEXin (KEFLEX) 500 MG capsule Take 1 capsule (500 mg total) by mouth 3 (three) times daily. 10/07/17   Irean Hong, MD  dicyclomine (BENTYL) 20 MG tablet Take 1 tablet (20 mg total) by mouth every 6 (six) hours as needed. 10/07/17   Irean Hong, MD  famotidine (PEPCID) 20 MG tablet Take 1 tablet (20 mg total) by mouth 2 (two) times daily. 10/07/17   Irean Hong, MD  ondansetron (ZOFRAN ODT) 4 MG disintegrating tablet Take 1 tablet (4 mg total) by mouth every 8 (eight) hours as needed for nausea or vomiting. 10/07/17   Irean Hong, MD  Sennosides (SENNA LAX PO) Take by mouth.    [provider]  valACYclovir (VALTREX) 500 MG tablet Take 1 tablet by mouth twice  daily for 3 days for outbreaks. 09/10/17   [name], NP    Allergies Patient has no known allergies.  Family History  Problem Relation Age of Onset  . COPD Mother   . Anxiety disorder Mother   . Dementia Mother     Social History Social History   Tobacco Use  . Smoking status: Never Smoker  . Smokeless tobacco: Never Used  Substance Use Topics  . Alcohol use: No    Comment: socially  . Drug use: No    Review of Systems  Constitutional: No fever/chills. Eyes: No visual  changes. ENT: No sore throat. Cardiovascular: Denies chest pain. Respiratory: Denies shortness of breath. Gastrointestinal: Positive for abdominal pain, nausea and vomiting.  No diarrhea.  No constipation. Genitourinary: Negative for dysuria. Musculoskeletal: Negative for back pain. Skin: Negative for rash. Neurological: Negative for headaches, focal weakness or numbness.   ____________________________________________   PHYSICAL EXAM:  VITAL SIGNS: ED Triage Vitals  Enc Vitals Group     BP 10/07/17 0048 114/67     Pulse Rate 10/07/17 0048 91     Resp 10/07/17 0048 18     Temp --      Temp Source 10/07/17 0048 Oral     SpO2 10/07/17 0048 100 %     Weight 10/07/17 0046 185 lb (83.9 kg)     Height 10/07/17 0046 5\' 2"  (1.575 m)     Head Circumference --      Peak Flow --      Pain Score 10/07/17 0046 9     Pain Loc --      Pain Edu? --      Excl. in GC? --     Constitutional: Alert and oriented. Well appearing and in mild acute distress. Eyes: Conjunctivae are normal. PERRL. EOMI. Head: Atraumatic. Nose: No congestion/rhinnorhea. Mouth/Throat: Mucous membranes are mildly dry.  Oropharynx non-erythematous. Neck: No stridor.   Cardiovascular: Normal rate, regular rhythm. Grossly normal heart sounds.  Good peripheral circulation. Respiratory: Normal respiratory effort.  No retractions. Lungs CTAB. Gastrointestinal: Soft and mildly tender to palpation epigastrium without rebound or guarding. No distention. No abdominal bruits. No CVA tenderness. Musculoskeletal: No lower extremity tenderness nor edema.  No joint effusions. Neurologic:  Normal speech and language. No gross focal neurologic deficits are appreciated. No gait instability. Skin:  Skin is warm, dry and intact. No rash noted. Psychiatric: Mood and affect are normal. Speech and behavior are normal.  ____________________________________________   LABS (all labs ordered are listed, but only abnormal results are  displayed)  Labs Reviewed  COMPREHENSIVE METABOLIC PANEL - Abnormal; Notable for the following components:      Result Value   Glucose, Bld 141 (*)    Total Protein 8.4 (*)    All other components within normal limits  CBC - Abnormal; Notable for the following components:   WBC 14.7 (*)    MCH 25.8 (*)    MCHC 31.4 (*)    All other components within normal limits  URINALYSIS, COMPLETE (UACMP) WITH MICROSCOPIC - Abnormal; Notable for the following components:   Color, Urine YELLOW (*)    APPearance HAZY (*)    Hgb urine dipstick SMALL (*)    Leukocytes, UA TRACE (*)    Squamous Epithelial / LPF 0-5 (*)    All other components within normal limits  LIPASE, BLOOD  TROPONIN I  POC URINE PREG, ED  POCT PREGNANCY, URINE   ____________________________________________  EKG  None ____________________________________________  RADIOLOGY  ED MD interpretation: No acute cardiopulmonary process, normal ultrasound, CT pending  Official radiology report(s): Dg Chest Port 1 View  Result Date: 10/07/2017 CLINICAL DATA:  Epigastric pain.  Nausea and vomiting. EXAM: PORTABLE CHEST 1 VIEW COMPARISON:  None. FINDINGS: The cardiomediastinal contours are normal. The lungs are clear. Pulmonary vasculature is normal. No consolidation, pleural effusion, or pneumothorax. No acute osseous abnormalities are seen. No evidence of free air in the upper abdomen. IMPRESSION: Unremarkable portable AP upright view of the chest. Electronically Signed   By: Rubye OaksMelanie  Ehinger M.D.   On: 10/07/2017 02:23   [place] Abdomen Limited Ruq  Result Date: 10/07/2017 CLINICAL DATA:  Initial evaluation for acute epigastric pain radiating to back. EXAM: ULTRASOUND ABDOMEN LIMITED RIGHT UPPER QUADRANT COMPARISON:  None. FINDINGS: Gallbladder: No gallstones or wall thickening visualized. No sonographic Murphy sign noted by sonographer. Common bile duct: Diameter: 2.8 mm Liver: No focal lesion identified. Within normal limits in  parenchymal echogenicity. Portal vein is patent on color Doppler imaging with normal direction of blood flow towards the liver. IMPRESSION: Normal right upper quadrant ultrasound. Electronically Signed   By: Rise MuBenjamin  McClintock M.D.   On: 10/07/2017 04:24    ____________________________________________   PROCEDURES  Procedure(s) performed: None  Procedures  Critical Care performed: No  ____________________________________________   INITIAL IMPRESSION / ASSESSMENT AND PLAN / ED COURSE  As part of my medical decision making, I reviewed the following data within the electronic MEDICAL RECORD NUMBER Nursing notes reviewed and incorporated, Labs reviewed, Old chart reviewed, Radiograph reviewed  and Notes from prior ED visits   32 year old female with IBS who presents with upper abdominal pain, nausea and vomiting. Differential diagnosis includes, but is not limited to, biliary disease (biliary colic, acute cholecystitis, cholangitis, choledocholithiasis, etc), intrathoracic causes for epigastric abdominal pain including ACS, gastritis, duodenitis, pancreatitis, small bowel or large bowel obstruction, abdominal aortic aneurysm, hernia, and gastritis.  Oral temperature is 97.67F.  Triage was unable to obtain temperature secondary to patient vomiting.  Without intervention, patient states nausea has improved.  Lab work generally is unremarkable with the exception of mild leukocytosis.  Will initiate IV fluid resuscitation, analgesia, antiemetic.  Will proceed with right upper quadrant abdominal ultrasound to evaluate for cholecystitis.  Clinical Course as of Oct 08 710  Thu Oct 07, 2017  [id number] Patient still complaining of abdominal discomfort despite Bentyl.  Nausea improved.  Given patient's history of GI issues, will obtain CT abdomen/pelvis to evaluate for inflammation, infection, ileus, SBO, etiology of patient's abdominal discomfort.  [JS]  [id number] Patient starting PO contrast for CT scan. Care  transferred to Dr. Mayford KnifeWilliams pending CT results and disposition.  [JS]    Clinical Course User Index [JS] Irean HongSung, Dalbert Stillings J, MD     ____________________________________________   FINAL CLINICAL IMPRESSION(S) / ED DIAGNOSES  Final diagnoses:  Epigastric pain  Non-intractable vomiting with nausea, unspecified vomiting type  Lower urinary tract infectious disease     ED Discharge Orders        Ordered    ondansetron (ZOFRAN ODT) 4 MG disintegrating tablet  Every 8 hours PRN     10/07/17 0450    famotidine (PEPCID) 20 MG tablet  2 times daily     10/07/17 0450    dicyclomine (BENTYL) 20 MG tablet  Every 6 hours PRN     10/07/17 0450    cephALEXin (KEFLEX) 500 MG capsule  3 times daily     10/07/17 0450       Note:  This  document was prepared using Conservation officer, historic buildings and may include unintentional dictation errors.    Irean Hong, MD 10/07/17 [phone]

## 2017-10-07 NOTE — ED Notes (Signed)
Pt verbalizes d/c teaching and follow up. Pt in NAD at time of departure, pt waiting in lobby for ride

## 2017-10-11 ENCOUNTER — Other Ambulatory Visit: Payer: Self-pay | Admitting: Primary Care

## 2017-10-11 DIAGNOSIS — J4520 Mild intermittent asthma, uncomplicated: Secondary | ICD-10-CM

## 2017-10-15 ENCOUNTER — Telehealth: Payer: Self-pay | Admitting: *Deleted

## 2017-10-15 DIAGNOSIS — K589 Irritable bowel syndrome without diarrhea: Secondary | ICD-10-CM

## 2017-10-15 NOTE — Telephone Encounter (Signed)
Copied from CRM [id number]. Topic: General - Other >> Oct 15, 2017 11:33 AM Elliot GaultBell, Tiffany M wrote:  Relation to pt: self  Call back number: [phone]   Reason for call:  Patient requesting gastro referral due to IBS flare up, please advise >> Oct 15, 2017 11:35 AM Elliot GaultBell, Tiffany M wrote:  Relation to pt: self  Call back number: [phone]   Reason for call:  Patient requesting gastro referral due to IBS flare up, please advise

## 2017-10-15 NOTE — Telephone Encounter (Signed)
Please call patient and ask if she's still seeing GI [facility] for [facility]. Based off of my notes from last month this was the case. Any improvement with dicyclomine (bentyl)?

## 2017-10-15 NOTE — Telephone Encounter (Signed)
Noted, referral placed.  

## 2017-10-15 NOTE — Telephone Encounter (Signed)
I called the pt and she stated she has been seen by [facility] and would like to see another GI due to the current office being too far away, feels irritated as stated she is still having vomiting and pain and they will not see her sooner than May 1st.  Patient stated she was seen in the ER last week due to vomiting and abdominal pain and does not want to take Dicyclomine as she is concerned this was make her constipation worse? [facility].

## 2017-10-19 ENCOUNTER — Encounter: Payer: Self-pay | Admitting: Primary Care

## 2017-10-21 ENCOUNTER — Other Ambulatory Visit (INDEPENDENT_AMBULATORY_CARE_PROVIDER_SITE_OTHER): Payer: 59

## 2017-10-21 DIAGNOSIS — R739 Hyperglycemia, unspecified: Secondary | ICD-10-CM | POA: Diagnosis not present

## 2017-10-21 LAB — HEMOGLOBIN A1C: Hgb A1c MFr Bld: 6.6 % — ABNORMAL HIGH (ref 4.6–6.5)

## 2017-10-22 ENCOUNTER — Encounter: Payer: Self-pay | Admitting: Primary Care

## 2017-11-02 ENCOUNTER — Telehealth: Payer: Self-pay | Admitting: Primary Care

## 2017-11-02 ENCOUNTER — Encounter (INDEPENDENT_AMBULATORY_CARE_PROVIDER_SITE_OTHER): Payer: Self-pay

## 2017-11-02 DIAGNOSIS — E119 Type 2 diabetes mellitus without complications: Secondary | ICD-10-CM

## 2017-11-02 MED ORDER — GLUCOSE BLOOD VI STRP: ORAL_STRIP | 2 refills | Status: DC

## 2017-11-02 MED ORDER — METFORMIN HCL 500 MG PO TABS: 500.0000 mg | ORAL_TABLET | Freq: Two times a day (BID) | ORAL | 1 refills | Status: DC

## 2017-11-02 MED ORDER — ONETOUCH ULTRASOFT LANCETS MISC: 2 refills | Status: DC

## 2017-11-02 NOTE — Telephone Encounter (Signed)
Copied from CRM [id number]. Topic: Quick Communication - Rx Refill/Question >> Nov 02, 2017  4:44 PM Raquel SarnaHayes, Teresa G wrote: glucose blood test strip  Lancets (ONETOUCH ULTRASOFT) lancets  Rx needs to be resent w/ Eliberto IvoryKatherine Clark's Supervising MD signature  [phone] [place] [place] [address] [place] [id number] Phone: [phone] Fax: [phone]

## 2017-11-02 NOTE — Telephone Encounter (Signed)
Copied from CRM [phone]. Topic: Inquiry >> Nov 02, 2017  1:24 PM Maia Pettiesrtiz, Kristie S wrote: Reason for CRM: pt needing strips and lancets for the One Touch Verio Flex - insurance company is sending her a meter  [id number] [place] [facility] [place] [address] [phone] (Phone) [phone] (Fax)

## 2017-11-02 NOTE — Telephone Encounter (Signed)
Rx for Metformin sent to pharmacy. Will also send my chart message with instructions for administration.

## 2017-11-02 NOTE — Telephone Encounter (Signed)
Sent the test strips and lancets. [facility].

## 2017-11-03 MED ORDER — GLUCOSE BLOOD VI STRP: ORAL_STRIP | 2 refills | Status: DC

## 2017-11-03 NOTE — Addendum Note (Signed)
Addended by: Tawnya CrookSAMBATH, Arlicia Paquette on: 11/03/2017 05:07 PM   Modules accepted: Orders

## 2017-11-03 NOTE — Addendum Note (Signed)
Addended by: Tawnya CrookSAMBATH, Teshaun Olarte on: 11/03/2017 04:55 PM   Modules accepted: Orders

## 2017-11-03 NOTE — Telephone Encounter (Signed)
Left v/m at [facility] that glucose test strips and lancets were sent electronically to their pharmacy on 11/02/17. If [facility] did not receive refills please contact our office.

## 2017-11-03 NOTE — Telephone Encounter (Signed)
Re-sent the test strips as requested.

## 2017-11-08 ENCOUNTER — Encounter: Payer: Self-pay | Admitting: Neurology

## 2017-11-08 ENCOUNTER — Ambulatory Visit: Payer: 59 | Admitting: Neurology

## 2017-11-08 ENCOUNTER — Other Ambulatory Visit: Payer: Self-pay

## 2017-11-08 VITALS — BP 103/65 | HR 73 | Ht 62.5 in | Wt 180.0 lb

## 2017-11-08 DIAGNOSIS — G935 Compression of brain: Secondary | ICD-10-CM
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
## 2017-11-12 ENCOUNTER — Other Ambulatory Visit: Payer: Self-pay | Admitting: Primary Care

## 2017-11-12 DIAGNOSIS — E119 Type 2 diabetes mellitus without complications: Secondary | ICD-10-CM

## 2017-11-12 NOTE — Telephone Encounter (Signed)
Copied from CRM [id number]. Topic: Quick Communication - See Telephone Encounter >> Nov 12, 2017  5:27 PM Lorrine KinMcGee, Garan Frappier B, [place] wrote: CRM for notification. See Telephone encounter for: 11/12/17. [facility] let Vernona RiegerKatherine Clark know that they received the Lancets (ONETOUCH ULTRASOFT) lancets fax that was sent today. Pharmacy stated that it was sent incorrectly. There needs to be a new prescription written and in the note section of the prescription the supervising MD needs to sign. [facility] not [facility] without Supervising MD signature. Please advise.   [id number] [place] - [id number]

## 2017-11-15 MED ORDER — ONETOUCH ULTRASOFT LANCETS MISC: 2 refills | Status: DC

## 2017-11-15 NOTE — Telephone Encounter (Signed)
I have re-send this Rx for lancets a couples times and even faxed as requested.  Jae DireKate, can you re-send this Rx?

## 2017-11-15 NOTE — Telephone Encounter (Signed)
Refill sent to pharmacy.   

## 2017-12-03 ENCOUNTER — Other Ambulatory Visit: Payer: Self-pay | Admitting: Primary Care

## 2017-12-03 DIAGNOSIS — E119 Type 2 diabetes mellitus without complications: Secondary | ICD-10-CM

## 2017-12-03 MED ORDER — GLUCOSE BLOOD VI STRP: ORAL_STRIP | 2 refills | Status: DC

## 2017-12-03 NOTE — Telephone Encounter (Signed)
Would you re-sent this Rx because we had to resent the lancets several times because the pharmacy wanted supervising provider. I did get anything about the test strips until patient mention it today. Please send. Thanks.

## 2017-12-03 NOTE — Telephone Encounter (Signed)
Noted Rx resent to pharmacy.

## 2018-02-04 ENCOUNTER — Other Ambulatory Visit: Payer: Self-pay | Admitting: Primary Care

## 2018-02-04 MED ORDER — ONDANSETRON 4 MG PO TBDP: 4.0000 mg | ORAL_TABLET | Freq: Three times a day (TID) | ORAL | 0 refills | Status: DC | PRN

## 2018-02-04 NOTE — Telephone Encounter (Signed)
Patient is calling to request a short term Rx- her GI office is closed today. Patient states she has severe nausea today- she has not had vomiting yet. Patient reports she had diarrhea and cramping starting Monday for 2 days and now she is flaring again. She does have an appointment with her GI on this coming Monday.  Rx refill request: Zofran ODT 4 mg  Ordered: 10/07/17 #20  LOV: 09/10/17  PCP: Chestine Sporelark  [facility]

## 2018-02-04 NOTE — Telephone Encounter (Signed)
Pt notified refill for Zofran was [facility] and pt voiced understanding.

## 2018-02-04 NOTE — Telephone Encounter (Signed)
Copied from CRM [id number]. Topic: Quick Communication - Rx Refill/Question >> Feb 04, 2018 10:01 AM Gloriann LoanPayne, Marbeth Smedley L wrote: Medication: ondansetron (ZOFRAN ODT) 4 MG disintegrating tablet  pt IBS flared up today  Has the patient contacted their pharmacy? Yes.   (Agent: If no, request that the patient contact the pharmacy for the refill.) (Agent: If yes, when and what did the pharmacy advise?)  Preferred Pharmacy (with phone number or street name): [id number] [place], [place] - [address] [phone] (Phone) [phone] (Fax)      Agent: Please be advised that RX refills may take up to 3 business days. We ask that you follow-up with your pharmacy.

## 2018-02-04 NOTE — Telephone Encounter (Signed)
Name of Medication: Zofran Name of Pharmacy: [facility] [address] Last Fill or Written Date and Quantity: # 20 on 10/07/17 by Dr Dolores FrameSung Last Office Visit and Type: Establish care 09/10/17 Next Office Visit and Type: none scheduled Allayne GitelmanK Clark NP out of office until 02/09/18.Please advise.

## 2018-06-09 DIAGNOSIS — A6000 Herpesviral infection of urogenital system, unspecified: Secondary | ICD-10-CM

## 2018-06-09 MED ORDER — VALACYCLOVIR HCL 500 MG PO TABS: ORAL_TABLET | ORAL | 1 refills | Status: DC

## 2018-07-25 ENCOUNTER — Ambulatory Visit: Payer: 59 | Admitting: Internal Medicine

## 2018-07-25 ENCOUNTER — Encounter: Payer: Self-pay | Admitting: Internal Medicine

## 2018-07-25 VITALS — BP 118/80 | HR 80 | Temp 98.0°F | Wt 177.0 lb

## 2018-07-25 DIAGNOSIS — R52 Pain, unspecified: Secondary | ICD-10-CM

## 2018-07-25 DIAGNOSIS — R05 Cough: Secondary | ICD-10-CM | POA: Diagnosis not present

## 2018-07-25 DIAGNOSIS — J4520 Mild intermittent asthma, uncomplicated: Secondary | ICD-10-CM

## 2018-07-25 DIAGNOSIS — R197 Diarrhea, unspecified: Secondary | ICD-10-CM

## 2018-07-25 DIAGNOSIS — R059 Cough, unspecified: Secondary | ICD-10-CM

## 2018-07-25 LAB — POC INFLUENZA A&B (BINAX/QUICKVUE)
Influenza A, POC: NEGATIVE
Influenza B, POC: NEGATIVE

## 2018-07-25 MED ORDER — ALBUTEROL SULFATE HFA 108 (90 BASE) MCG/ACT IN AERS: 2.0000 | INHALATION_SPRAY | Freq: Four times a day (QID) | RESPIRATORY_TRACT | 2 refills | Status: DC | PRN

## 2018-07-25 NOTE — Progress Notes (Signed)
HPI  Pt presents to the clinic today with c/o cough, diarrhea and body aches. She reports this started 2-3 days ago. The cough is productive of yellow mucous. She denies runny nose, nasal congestion, ear pain or sore throat. She has not had any diarrhea today. She denies nausea, vomiting or blood in her stool. She denies fever, chills. She has tried Sudafed, Tylenol Cold and Flu, and Ibuprofen with some relief. She has a history of asthma, requesting a refill of her inhaler. She has not had sick contacts.  Review of Systems      Past Medical History:  Diagnosis Date  . ADHD   . Asthma    excercise induced  . Chiari I malformation (HCC)   . Decreased fetal movement during pregnancy in third trimester, antepartum 12/05/2014  . Decreased fetal movement in pregnancy in third trimester, antepartum 12/23/2014  . GDM, class A2 12/25/2014  . Gestational diabetes mellitus, antepartum   . Hyperlipidemia   . IBS (irritable bowel syndrome)   . No pertinent past medical history   . Vaginal Pap smear, abnormal     Family History  Problem Relation Age of Onset  . COPD Mother   . Anxiety disorder Mother   . Dementia Mother     Social History   Socioeconomic History  . Marital status: Single    Spouse name: Not on file  . Number of children: 1  . Years of education: BA  . Highest education level: Not on file  Occupational History  . 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 Financial resource strain: Not on file  . Food insecurity:    Worry: Not on file    Inability: Not on file  . Transportation needs:    Medical: Not on file    Non-medical: Not on file  Tobacco Use  . Smoking status: Never Smoker  . Smokeless tobacco: Never Used  Substance and Sexual Activity  . Alcohol use: No    Comment: socially  . Drug use: No  . Sexual activity: Yes    Birth control/protection: None  Lifestyle  . Physical activity:    Days per week: Not on file    Minutes per session: Not on  file  . Stress: Not on file  Relationships  . Social connections:    Talks on phone: Not on file    Gets together: Not on file    Attends religious service: Not on file    Active member of club or organization: Not on file    Attends meetings of clubs or organizations: Not on file    Relationship status: Not on file  . Intimate partner violence:    Fear of current or ex partner: Not on file    Emotionally abused: Not on file    Physically abused: Not on file    Forced sexual activity: Not on file  Other Topics Concern  . Not on file  Social History Narrative   Lives with mother and daughter   Caffeine use: Drinks 1/2 cup per day   Right handed     No Known Allergies   Constitutional:  Positive body aches. Denies headache, fatigue, fever or abrupt weight changes.  HEENT:  Denies eye redness, eye pain, pressure behind the eyes, facial pain, nasal congestion, ear pain, ringing in the ears, wax buildup, runny nose or sore throat. Respiratory: Positive cough. Denies difficulty breathing or shortness of breath.  Cardiovascular: Denies chest pain, chest tightness, palpitations or swelling  in the hands or feet.  GI: Positive diarrhea. Denies abdominal pain, nausea, vomiting, constipation or blood in her stool.  No other specific complaints in a complete review of systems (except as listed in HPI above).  Objective:   BP 118/80   Pulse 80   Temp 98 F (36.7 C) (Oral)   Wt 177 lb (80.3 kg)   SpO2 98%   BMI 31.86 kg/m  Wt Readings from Last 3 Encounters:  07/25/18 177 lb (80.3 kg)  11/08/17 180 lb (81.6 kg)  10/07/17 185 lb (83.9 kg)     General: Appears her stated age, in NAD. HEENT: Head: normal shape and size, no sinus tenderness noted;Ears: Tm's gray and intact, normal light reflex; Nose: mucosa pink and moist, septum midline; Throat/Mouth: Teeth present, mucosa pink and moist, no exudate noted, no lesions or ulcerations noted.  Neck: Bilateral anterior cervical  lymphadenopathy.  Cardiovascular: Normal rate and rhythm. S1,S2 noted.  No murmur, rubs or gallops noted.  Pulmonary/Chest: Normal effort and positive vesicular breath sounds. No respiratory distress. No wheezes, rales or ronchi noted.  Abdomen:      Assessment & Plan:   Cough, Diarrhea, Body Aches:  Rapid Flu: negative Likely viral Get some rest and drink plenty of water Delsym as needed for cough Work note provided  RTC as needed or if symptoms persist.   Nicki Reaperegina Baity, NP

## 2018-07-25 NOTE — Patient Instructions (Signed)
Viral Illness, Adult °Viruses are tiny germs that can get into a person's body and cause illness. There are many different types of viruses, and they cause many types of illness. Viral illnesses can range from mild to severe. They can affect various parts of the body. °Common illnesses that are caused by a virus include colds and the flu. Viral illnesses also include serious conditions such as HIV/AIDS (human immunodeficiency virus/acquired immunodeficiency syndrome). A few viruses have been linked to certain cancers. °What are the causes? °Many types of viruses can cause illness. Viruses invade cells in your body, multiply, and cause the infected cells to malfunction or die. When the cell dies, it releases more of the virus. When this happens, you develop symptoms of the illness, and the virus continues to spread to other cells. If the virus takes over the function of the cell, it can cause the cell to divide and grow out of control, as is the case when a virus causes cancer. °Different viruses get into the body in different ways. You can get a virus by: °· Swallowing food or water that is contaminated with the virus. °· Breathing in droplets that have been coughed or sneezed into the air by an infected person. °· Touching a surface that has been contaminated with the virus and then touching your eyes, nose, or mouth. °· Being bitten by an insect or animal that carries the virus. °· Having sexual contact with a person who is infected with the virus. °· Being exposed to blood or fluids that contain the virus, either through an open cut or during a transfusion. °If a virus enters your body, your body's defense system (immune system) will try to fight the virus. You may be at higher risk for a viral illness if your immune system is weak. °What are the signs or symptoms? °Symptoms vary depending on the type of virus and the location of the cells that it invades. Common symptoms of the main types of viral illnesses  include: °Cold and flu viruses °· Fever. °· Headache. °· Sore throat. °· Muscle aches. °· Nasal congestion. °· Cough. °Digestive system (gastrointestinal) viruses °· Fever. °· Abdominal pain. °· Nausea. °· Diarrhea. °Liver viruses (hepatitis) °· Loss of appetite. °· Tiredness. °· Yellowing of the skin (jaundice). °Brain and spinal cord viruses °· Fever. °· Headache. °· Stiff neck. °· Nausea and vomiting. °· Confusion or sleepiness. °Skin viruses °· Warts. °· Itching. °· Rash. °Sexually transmitted viruses °· Discharge. °· Swelling. °· Redness. °· Rash. °How is this treated? °Viruses can be difficult to treat because they live within cells. Antibiotic medicines do not treat viruses because these drugs do not get inside cells. Treatment for a viral illness may include: °· Resting and drinking plenty of fluids. °· Medicines to relieve symptoms. These can include over-the-counter medicine for pain and fever, medicines for cough or congestion, and medicines to relieve diarrhea. °· Antiviral medicines. These drugs are available only for certain types of viruses. They may help reduce flu symptoms if taken early. There are also many antiviral medicines for hepatitis and HIV/AIDS. °Some viral illnesses can be prevented with vaccinations. A common example is the flu shot. °Follow these instructions at home: °Medicines ° °· Take over-the-counter and prescription medicines only as told by your health care provider. °· If you were prescribed an antiviral medicine, take it as told by your health care provider. Do not stop taking the medicine even if you start to feel better. °· Be aware of when   antibiotics are needed and when they are not needed. Antibiotics do not treat viruses. If your health care provider thinks that you may have a bacterial infection as well as a viral infection, you may get an antibiotic. °? Do not ask for an antibiotic prescription if you have been diagnosed with a viral illness. That will not make your  illness go away faster. °? Frequently taking antibiotics when they are not needed can lead to antibiotic resistance. When this develops, the medicine no longer works against the bacteria that it normally fights. °General instructions °· Drink enough fluids to keep your urine clear or pale yellow. °· Rest as much as possible. °· Return to your normal activities as told by your health care provider. Ask your health care provider what activities are safe for you. °· Keep all follow-up visits as told by your health care provider. This is important. °How is this prevented? °Take these actions to reduce your risk of viral infection: °· Eat a healthy diet and get enough rest. °· Wash your hands often with soap and water. This is especially important when you are in public places. If soap and water are not available, use hand sanitizer. °· Avoid close contact with friends and family who have a viral illness. °· If you travel to areas where viral gastrointestinal infection is common, avoid drinking water or eating raw food. °· Keep your immunizations up to date. Get a flu shot every year as told by your health care provider. °· Do not share toothbrushes, nail clippers, razors, or needles with other people. °· Always practice safe sex. ° °Contact a health care provider if: °· You have symptoms of a viral illness that do not go away. °· Your symptoms come back after going away. °· Your symptoms get worse. °Get help right away if: °· You have trouble breathing. °· You have a severe headache or a stiff neck. °· You have severe vomiting or abdominal pain. °This information is not intended to replace advice given to you by your health care provider. Make sure you discuss any questions you have with your health care provider. °Document Released: 11/29/2015 Document Revised: 01/01/2016 Document Reviewed: 11/29/2015 °Elsevier Interactive Patient Education © 2019 [employer] Inc[employer] ° °

## 2018-07-29 NOTE — Addendum Note (Signed)
Addended by: Roena MaladyEVONTENNO, Taylon Louison Y on: 07/29/2018 01:38 PM   Modules accepted: Orders

## 2018-08-31 ENCOUNTER — Emergency Department
Admission: EM | Admit: 2018-08-31 | Discharge: 2018-08-31 | Disposition: A | Discharge status: Home / Self Care | Payer: 59 | Attending: Emergency Medicine | Admitting: Emergency Medicine

## 2018-08-31 ENCOUNTER — Emergency Department: Payer: 59

## 2018-08-31 ENCOUNTER — Encounter: Payer: Self-pay | Admitting: Emergency Medicine

## 2018-08-31 ENCOUNTER — Other Ambulatory Visit: Payer: Self-pay

## 2018-08-31 DIAGNOSIS — Z79899 Other long term (current) drug therapy: Secondary | ICD-10-CM | POA: Insufficient documentation

## 2018-08-31 DIAGNOSIS — K2900 Acute gastritis without bleeding: Secondary | ICD-10-CM | POA: Diagnosis not present

## 2018-08-31 DIAGNOSIS — J45909 Unspecified asthma, uncomplicated: Secondary | ICD-10-CM | POA: Insufficient documentation

## 2018-08-31 DIAGNOSIS — R1013 Epigastric pain: Secondary | ICD-10-CM

## 2018-08-31 LAB — CBC WITH DIFFERENTIAL/PLATELET
ABS IMMATURE GRANULOCYTES: 0.02 10*3/uL (ref 0.00–0.07)
Basophils Absolute: 0 10*3/uL (ref 0.0–0.1)
Basophils Relative: 0 %
Eosinophils Absolute: 0.2 10*3/uL (ref 0.0–0.5)
Eosinophils Relative: 3 %
HCT: 38.5 % (ref 36.0–46.0)
Hemoglobin: 12.2 g/dL (ref 12.0–15.0)
IMMATURE GRANULOCYTES: 0 %
LYMPHS PCT: 29 %
Lymphs Abs: 2.6 10*3/uL (ref 0.7–4.0)
MCH: 27.3 pg (ref 26.0–34.0)
MCHC: 31.7 g/dL (ref 30.0–36.0)
MCV: 86.1 fL (ref 80.0–100.0)
Monocytes Absolute: 0.5 10*3/uL (ref 0.1–1.0)
Monocytes Relative: 6 %
Neutro Abs: 5.7 10*3/uL (ref 1.7–7.7)
Neutrophils Relative %: 62 %
PLATELETS: 309 10*3/uL (ref 150–400)
RBC: 4.47 MIL/uL (ref 3.87–5.11)
RDW: 12.4 % (ref 11.5–15.5)
WBC: 9.1 10*3/uL (ref 4.0–10.5)
nRBC: 0 % (ref 0.0–0.2)

## 2018-08-31 LAB — COMPREHENSIVE METABOLIC PANEL
ALT: 16 U/L (ref 0–44)
ANION GAP: 7 (ref 5–15)
AST: 21 U/L (ref 15–41)
Albumin: 4.1 g/dL (ref 3.5–5.0)
Alkaline Phosphatase: 48 U/L (ref 38–126)
BUN: 16 mg/dL (ref 6–20)
CO2: 21 mmol/L — ABNORMAL LOW (ref 22–32)
Calcium: 9 mg/dL (ref 8.9–10.3)
Chloride: 106 mmol/L (ref 98–111)
Creatinine, Ser: 0.66 mg/dL (ref 0.44–1.00)
GFR calc Af Amer: >60 mL/min (ref >60)
GFR calc non Af Amer: >60 mL/min (ref >60)
Glucose, Bld: 170 mg/dL — ABNORMAL HIGH (ref 70–99)
Potassium: 4.2 mmol/L (ref 3.5–5.1)
Sodium: 134 mmol/L — ABNORMAL LOW (ref 135–145)
TOTAL PROTEIN: 8.2 g/dL — AB (ref 6.5–8.1)
Total Bilirubin: 0.6 mg/dL (ref 0.3–1.2)

## 2018-08-31 LAB — URINALYSIS, COMPLETE (UACMP) WITH MICROSCOPIC
Bacteria, UA: NONE SEEN
Bilirubin Urine: NEGATIVE
Glucose, UA: NEGATIVE mg/dL
Ketones, ur: NEGATIVE mg/dL
Leukocytes, UA: NEGATIVE
NITRITE: NEGATIVE
Protein, ur: NEGATIVE mg/dL
Specific Gravity, Urine: 1.024 (ref 1.005–1.030)
pH: 5 (ref 5.0–8.0)

## 2018-08-31 LAB — TROPONIN I: Troponin I: <0.03 ng/mL (ref <0.03)

## 2018-08-31 LAB — LIPASE, BLOOD: Lipase: 28 U/L (ref 11–51)

## 2018-08-31 LAB — POCT PREGNANCY, URINE: Preg Test, Ur: NEGATIVE

## 2018-08-31 MED ORDER — SUCRALFATE 1 G PO TABS
1.0000 g | ORAL_TABLET | Freq: Once | ORAL | Status: AC
  Administered 2018-08-31: 1 g via ORAL
  Filled 2018-08-31: qty 1

## 2018-08-31 MED ORDER — METOCLOPRAMIDE HCL 10 MG PO TABS: 10.0000 mg | ORAL_TABLET | Freq: Four times a day (QID) | ORAL | 0 refills | Status: DC | PRN

## 2018-08-31 MED ORDER — PANTOPRAZOLE SODIUM 40 MG IV SOLR
40.0000 mg | Freq: Once | INTRAVENOUS | Status: AC
  Administered 2018-08-31: 40 mg via INTRAVENOUS
  Filled 2018-08-31: qty 40

## 2018-08-31 MED ORDER — SODIUM CHLORIDE 0.9 % IV BOLUS
1000.0000 mL | Freq: Once | INTRAVENOUS | Status: AC
  Administered 2018-08-31: 1000 mL via INTRAVENOUS

## 2018-08-31 MED ORDER — METOCLOPRAMIDE HCL 5 MG/ML IJ SOLN
10.0000 mg | Freq: Once | INTRAMUSCULAR | Status: AC
  Administered 2018-08-31: 10 mg via INTRAVENOUS
  Filled 2018-08-31: qty 2

## 2018-08-31 MED ORDER — IOPAMIDOL (ISOVUE-300) INJECTION 61%
100.0000 mL | Freq: Once | INTRAVENOUS | Status: AC | PRN
  Administered 2018-08-31: 100 mL via INTRAVENOUS

## 2018-08-31 MED ORDER — SUCRALFATE 1 G PO TABS: 1.0000 g | ORAL_TABLET | Freq: Four times a day (QID) | ORAL | 1 refills | Status: DC

## 2018-08-31 NOTE — ED Provider Notes (Signed)
[facility] Emergency Department Provider Note  ____________________________________________  Time seen: Approximately 8:37 AM  I have reviewed the triage vital signs and the nursing notes.   HISTORY  Chief Complaint Abdominal Pain    HPI Brenda Valdez is a 33 y.o. female with a history of IBS and hyperlipidemia who complains of 1 month of intermittent upper abdominal pain worse in the epigastrium, radiating around to the right upper abdomen as well.  No aggravating or alleviating factors.  No vomiting or diarrhea.  Associated with feeling of heartburn and "sulfur" taste in her mouth.  She has seen a gastroenterologist who has completed an upper endoscopy 2 weeks ago and a complete abdominal ultrasound 2 days ago, both were reported as unremarkable.  I was able to see the ultrasound report myself in the electronic medical record.  She takes Pepcid and Prilosec.  Does not take Carafate or Maalox.      Past Medical History:  Diagnosis Date  . ADHD   . Asthma    excercise induced  . Chiari I malformation (HCC)   . Decreased fetal movement during pregnancy in third trimester, antepartum 12/05/2014  . Decreased fetal movement in pregnancy in third trimester, antepartum 12/23/2014  . Hyperlipidemia   . IBS (irritable bowel syndrome)   . No pertinent past medical history   . Vaginal Pap smear, abnormal      Patient Active Problem List   Diagnosis Date Noted  . Mild intermittent asthma without complication 09/10/2017  . Genital herpes simplex 09/10/2017  . Chiari I malformation (HCC) 09/10/2017  . Hyperlipidemia 09/10/2017  . Chest discomfort 09/10/2017     Past Surgical History:  Procedure Laterality Date  . CESAREAN SECTION N/A 12/25/2014   Procedure: CESAREAN SECTION;  Surgeon: Shea Evans, MD;  Location: [facility];  Service: Obstetrics;  Laterality: N/A;  . LAPAROSCOPY  03/24/2011   Procedure: LAPAROSCOPY DIAGNOSTIC;  Surgeon: Meriel Pica;   Location: [facility];  Service: Gynecology;  Laterality: N/A;  . 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 LEEP  08/11/2012   Procedure: LOOP ELECTROSURGICAL EXCISION PROCEDURE (LEEP);  Surgeon: Meriel Pica, MD;  Location: [facility];  Service: Gynecology;  Laterality: N/A;  . WISDOM TOOTH EXTRACTION       Prior to Admission medications   Medication Sig Start Date End Date Taking? Authorizing Provider  ADDERALL XR 20 MG 24 hr capsule Take 1 capsule by mouth daily.  09/17/17  Yes [provider]  albuterol (PROVENTIL HFA;VENTOLIN HFA) 108 (90 Base) MCG/ACT inhaler Inhale 2 puffs into the lungs every 6 (six) hours as needed. For wheezing 07/25/18  Yes Baity, Salvadore Oxford, NP  dicyclomine (BENTYL) 10 MG capsule Take 10 mg by mouth as needed for spasms.   Yes [provider]  famotidine (PEPCID) 20 MG tablet Take 1 tablet (20 mg total) by mouth 2 (two) times daily. Patient taking differently: Take 20 mg by mouth daily as needed.  10/07/17  Yes Irean Hong, MD  linaclotide Karlene Einstein) 72 MCG capsule Take 72 mcg by mouth daily before breakfast.  07/12/18  Yes [provider]  omeprazole (PRILOSEC) 20 MG capsule Take 20 mg by mouth daily as needed.    Yes [provider]  ondansetron (ZOFRAN ODT) 4 MG disintegrating tablet Take 1 tablet (  4 mg total) by mouth every 8 (eight) hours as needed for nausea or vomiting. 02/04/18  Yes Bedsole, Amy E, MD  valACYclovir (VALTREX) 500 MG tablet Take 1 tablet by mouth twice daily for 3 days for outbreaks. 06/09/18  Yes [name], NP  metoCLOPramide (REGLAN) 10 MG tablet Take 1 tablet (10 mg total) by mouth every 6 (six) hours as needed. 08/31/18   Sharman Cheek, MD  sucralfate (CARAFATE) 1 g tablet Take 1 tablet (1 g total) by mouth 4 (four) times daily.  08/31/18   Sharman Cheek, MD     Allergies Patient has no known allergies.   Family History  Problem Relation Age of Onset  . COPD Mother   . Anxiety disorder Mother   . Dementia Mother     Social History Social History   Tobacco Use  . Smoking status: Never Smoker  . Smokeless tobacco: Never Used  Substance Use Topics  . Alcohol use: No    Comment: socially  . Drug use: No    Review of Systems  Constitutional:   No fever or chills.  ENT:   No sore throat. No rhinorrhea. Cardiovascular:   No chest pain or syncope. Respiratory:   No dyspnea or cough. Gastrointestinal: Positive for epigastric pain without vomiting and diarrhea.  Musculoskeletal:   Negative for focal pain or swelling All other systems reviewed and are negative except as documented above in ROS and HPI.  ____________________________________________   PHYSICAL EXAM:  VITAL SIGNS: ED Triage Vitals  Enc Vitals Group     BP 08/31/18 0450 113/64     Pulse Rate 08/31/18 0450 77     Resp 08/31/18 0450 20     Temp 08/31/18 0450 98.3 F (36.8 C)     Temp Source 08/31/18 0450 Oral     SpO2 08/31/18 0450 98 %     Weight 08/31/18 0435 180 lb (81.6 kg)     Height 08/31/18 0435 5\' 2"  (1.575 m)     Head Circumference --      Peak Flow --      Pain Score 08/31/18 0435 8     Pain Loc --      Pain Edu? --      Excl. in GC? --     Vital signs reviewed, nursing assessments reviewed.   Constitutional:   Alert and oriented. Non-toxic appearance. Eyes:   Conjunctivae are normal. EOMI. PERRL. ENT      Head:   Normocephalic and atraumatic.      Nose:   No congestion/rhinnorhea.       Mouth/Throat:   MMM, no pharyngeal erythema. No peritonsillar mass.       Neck:   No meningismus. Full ROM. Hematological/Lymphatic/Immunilogical:   No cervical lymphadenopathy. Cardiovascular:   RRR. Symmetric bilateral radial and DP pulses.  No murmurs. Cap refill less than 2 seconds. Respiratory:   Normal respiratory  effort without tachypnea/retractions. Breath sounds are clear and equal bilaterally. No wheezes/rales/rhonchi. Gastrointestinal:   Soft with epigastric and left upper quadrant tenderness.  Non distended. There is no CVA tenderness.  No rebound, rigidity, or guarding. Musculoskeletal:   Normal range of motion in all extremities. No joint effusions.  No lower extremity tenderness.  No edema. Neurologic:   Normal speech and language.  Motor grossly intact. No acute focal neurologic deficits are appreciated.  Skin:    Skin is warm, dry and intact. No rash noted.  No petechiae, purpura, or bullae.  ____________________________________________  LABS (pertinent positives/negatives) (all labs ordered are listed, but only abnormal results are displayed) Labs Reviewed  COMPREHENSIVE METABOLIC PANEL - Abnormal; Notable for the following components:      Result Value   Sodium 134 (*)    CO2 21 (*)    Glucose, Bld 170 (*)    Total Protein 8.2 (*)    All other components within normal limits  URINALYSIS, COMPLETE (UACMP) WITH MICROSCOPIC - Abnormal; Notable for the following components:   Color, Urine YELLOW (*)    APPearance CLEAR (*)    Hgb urine dipstick SMALL (*)    All other components within normal limits  CBC WITH DIFFERENTIAL/PLATELET  LIPASE, BLOOD  TROPONIN I  POCT PREGNANCY, URINE   ____________________________________________   EKG  Interpreted by me  Date: 08/31/2018  Rate: 63  Rhythm: normal sinus rhythm  QRS Axis: normal  Intervals: normal  ST/T Wave abnormalities: normal  Conduction Disutrbances: none  Narrative Interpretation: unremarkable      ____________________________________________    RADIOLOGY  Ct Abdomen Pelvis W Contrast  Result Date: 08/31/2018 CLINICAL DATA:  Nausea and abdominal pain beginning at 1 a.m. last night. EXAM: CT ABDOMEN AND PELVIS WITH CONTRAST TECHNIQUE: Multidetector CT imaging of the abdomen and pelvis was performed using the  standard protocol following bolus administration of intravenous contrast. CONTRAST:  100 mL ISOVUE-300 IOPAMIDOL (ISOVUE-300) INJECTION 61% COMPARISON:  CT abdomen and pelvis 10/07/2017. FINDINGS: Lower chest: Lung bases clear.  No pleural or pericardial effusion. Hepatobiliary: No focal liver abnormality is seen. No gallstones, gallbladder wall thickening, or biliary dilatation. Pancreas: Unremarkable. No pancreatic ductal dilatation or surrounding inflammatory changes. Spleen: Normal in size without focal abnormality. Adrenals/Urinary Tract: Adrenal glands are unremarkable. Kidneys are normal, without renal calculi, focal lesion, or hydronephrosis. Bladder is unremarkable. Stomach/Bowel: Stomach is within normal limits. Appendix appears normal. No evidence of bowel wall thickening, distention, or inflammatory changes. Contents of the colon have fluid density suggestive of diarrhea. Vascular/Lymphatic: No significant vascular findings are present. No enlarged abdominal or pelvic lymph nodes. Reproductive: Uterus and bilateral adnexa are unremarkable. Other: Small fat containing umbilical hernia is noted. Musculoskeletal: Negative. IMPRESSION: Colonic contents are fluid density suggestive of diarrhea. The bowel otherwise appears normal. Small fat containing umbilical hernia. Electronically Signed   By: Drusilla Kannerhomas  Dalessio M.D.   On: 08/31/2018 08:44    ____________________________________________   PROCEDURES Procedures  ____________________________________________  DIFFERENTIAL DIAGNOSIS   GERD/gastritis, pancreatitis, pancreatic mass, peptic ulcer disease, perforation  CLINICAL IMPRESSION / ASSESSMENT AND PLAN / ED COURSE  Pertinent labs & imaging results that were available during my care of the patient were reviewed by me and considered in my medical decision making (see chart for details).    Patient presents with acute on chronic upper GI symptoms, currently under the care of gastroenterology  and undertaking outpatient work-up.  Vital signs unremarkable.  Given the significant tenderness she has had recent instrumentation, I be worried about an iatrogenic illness or pancreatic disease that may not be readily apparent on initial lab work.  I will obtain a CT scan to further evaluate.  In the meantime given IV Protonix, Carafate, Reglan.  If CT negative I suspect this is worsening of her chronic gastritis and would plan for maximal antiacid therapy.   ----------------------------------------- 11:18 AM on 08/31/2018 -----------------------------------------  CT negative.  Will add on Carafate and Reglan, follow-up with her GI.     ____________________________________________   FINAL CLINICAL IMPRESSION(S) / ED DIAGNOSES    Final diagnoses:  Epigastric  pain  Other acute gastritis without hemorrhage     ED Discharge Orders         Ordered    metoCLOPramide (REGLAN) 10 MG tablet  Every 6 hours PRN     08/31/18 1116    sucralfate (CARAFATE) 1 g tablet  4 times daily     08/31/18 1116          Portions of this note were generated with dragon dictation software. Dictation errors may occur despite best attempts at proofreading.   Sharman CheekStafford, Shanna Un, MD 08/31/18 1118

## 2018-08-31 NOTE — ED Triage Notes (Signed)
Patient ambulatory to triage with steady gait, without difficulty or distress noted; pt reports awoke at 1am with mid epigastric pain; st hx of same with IBS

## 2018-08-31 NOTE — Discharge Instructions (Signed)
Your CT scan today was unremarkable.  Continue taking your home medications.  Add on carafate and reglan to help improve your symptoms until you can see your doctor again.

## 2018-08-31 NOTE — ED Notes (Signed)
Pt taken to CT.

## 2018-09-01 ENCOUNTER — Telehealth: Payer: Self-pay | Admitting: *Deleted

## 2018-09-01 NOTE — Telephone Encounter (Signed)
Lm requesting return call to complete TCM and confirm hosp f/u appt  

## 2018-09-04 ENCOUNTER — Other Ambulatory Visit: Payer: Self-pay | Admitting: Primary Care

## 2018-09-04 DIAGNOSIS — E119 Type 2 diabetes mellitus without complications: Secondary | ICD-10-CM

## 2018-09-07 ENCOUNTER — Other Ambulatory Visit (INDEPENDENT_AMBULATORY_CARE_PROVIDER_SITE_OTHER): Payer: 59

## 2018-09-07 DIAGNOSIS — E119 Type 2 diabetes mellitus without complications: Secondary | ICD-10-CM

## 2018-09-07 DIAGNOSIS — J4520 Mild intermittent asthma, uncomplicated: Secondary | ICD-10-CM

## 2018-09-07 LAB — LIPID PANEL
CHOLESTEROL: 235 mg/dL — AB (ref 0–200)
HDL: 35.8 mg/dL — ABNORMAL LOW (ref >39.00)
LDL CALC: 180 mg/dL — AB (ref 0–99)
NonHDL: 198.73
Total CHOL/HDL Ratio: 7
Triglycerides: 94 mg/dL (ref 0.0–149.0)
VLDL: 18.8 mg/dL (ref 0.0–40.0)

## 2018-09-07 LAB — BASIC METABOLIC PANEL
BUN: 12 mg/dL (ref 6–23)
CO2: 23 mEq/L (ref 19–32)
Calcium: 9.4 mg/dL (ref 8.4–10.5)
Chloride: 104 mEq/L (ref 96–112)
Creatinine, Ser: 0.9 mg/dL (ref 0.40–1.20)
GFR: 88.13 mL/min (ref >60.00)
Glucose, Bld: 132 mg/dL — ABNORMAL HIGH (ref 70–99)
POTASSIUM: 4.1 meq/L (ref 3.5–5.1)
Sodium: 138 mEq/L (ref 135–145)

## 2018-09-07 LAB — MICROALBUMIN / CREATININE URINE RATIO
CREATININE, U: 308.7 mg/dL
Microalb Creat Ratio: 0.5 mg/g (ref 0.0–30.0)
Microalb, Ur: 1.4 mg/dL (ref 0.0–1.9)

## 2018-09-07 LAB — HEMOGLOBIN A1C: Hgb A1c MFr Bld: 6.4 % (ref 4.6–6.5)

## 2018-09-07 MED ORDER — ALBUTEROL SULFATE HFA 108 (90 BASE) MCG/ACT IN AERS: 2.0000 | INHALATION_SPRAY | Freq: Four times a day (QID) | RESPIRATORY_TRACT | 1 refills | Status: DC | PRN

## 2018-09-13 ENCOUNTER — Ambulatory Visit (INDEPENDENT_AMBULATORY_CARE_PROVIDER_SITE_OTHER): Payer: 59 | Admitting: Primary Care

## 2018-09-13 ENCOUNTER — Encounter: Payer: Self-pay | Admitting: Primary Care

## 2018-09-13 VITALS — BP 118/76 | HR 72 | Temp 98.2°F | Ht 62.5 in | Wt 179.2 lb

## 2018-09-13 DIAGNOSIS — E1165 Type 2 diabetes mellitus with hyperglycemia: Secondary | ICD-10-CM | POA: Insufficient documentation

## 2018-09-13 DIAGNOSIS — Z0000 Encounter for general adult medical examination without abnormal findings: Secondary | ICD-10-CM | POA: Diagnosis not present

## 2018-09-13 DIAGNOSIS — E785 Hyperlipidemia, unspecified: Secondary | ICD-10-CM

## 2018-09-13 DIAGNOSIS — E119 Type 2 diabetes mellitus without complications: Secondary | ICD-10-CM | POA: Diagnosis not present

## 2018-09-13 DIAGNOSIS — K589 Irritable bowel syndrome without diarrhea: Secondary | ICD-10-CM | POA: Insufficient documentation

## 2018-09-13 DIAGNOSIS — A6000 Herpesviral infection of urogenital system, unspecified: Secondary | ICD-10-CM | POA: Diagnosis not present

## 2018-09-13 DIAGNOSIS — K582 Mixed irritable bowel syndrome: Secondary | ICD-10-CM | POA: Diagnosis not present

## 2018-09-13 DIAGNOSIS — G935 Compression of brain: Secondary | ICD-10-CM

## 2018-09-13 DIAGNOSIS — J4520 Mild intermittent asthma, uncomplicated: Secondary | ICD-10-CM

## 2018-09-13 MED ORDER — METOCLOPRAMIDE HCL 10 MG PO TABS: 10.0000 mg | ORAL_TABLET | Freq: Four times a day (QID) | ORAL | 0 refills | Status: DC | PRN

## 2018-09-13 MED ORDER — SUCRALFATE 1 G PO TABS: 1.0000 g | ORAL_TABLET | Freq: Four times a day (QID) | ORAL | 0 refills | Status: DC

## 2018-09-13 NOTE — Assessment & Plan Note (Signed)
Evaluated by neurology in 2019. Continue neurology evaluation.

## 2018-09-13 NOTE — Assessment & Plan Note (Addendum)
Chronic for years, following with GI. Recent upper endoscopy unremarkable. She will follow up with GI in March as scheduled.  Will provide her with one refill of Carafate and Reglan until seen. Emergency department notes, labs, imaging reviewed.

## 2018-09-13 NOTE — Assessment & Plan Note (Signed)
One outbreak within the last year. Using Valtrex PRN.  Continue same.

## 2018-09-13 NOTE — Progress Notes (Signed)
Subjective:    Patient ID: Brenda Valdez, female    DOB: 11/22/1985, 33 y.o.   MRN: [id number]  HPI  Brenda Valdez is a 33 year old female who presents today for complete physical and emergency department follow up.  She presented to [facility] ED on 08/31/18 with a chief complaint of one month of intermittent upper abdominal pain worse in the epigastric area and to right upper abdomen. Also with "sulfur" taste in her mouth. She had been evaluated by GI several weeks prior, underwent upper endoscopy and abdominal ultrasound which were unremarkable.   During her ED stay she underwent CT abdomen/Pelvis which showed diarrhea, otherwise unremarkable. She was treated with IV Protonix, Carafate, Reglan and felt improved. She was discharged home and prescribed Carafate and Reglan and asked to follow up with GI.   Since her ED visit she never filled her prescriptions for Reglan and Carafate. She has not had a "flare" since. She has a follow up visit with GI in one month.  She is frustrated as she does not believe her symptoms are secondary to IBS.  She is interested in food allergy testing.  Immunizations: -Tetanus: Completed in 2015 -Influenza: Declines.  Diet:  Breakfast: Skips Lunch: Skips sometimes, left overs, salad Dinner: Pasta, chicken, vegetables, starch, some fast food Snacks: Occasionally chips Desserts: Trail mix, twice weekly  Beverages: Coffee, water, soda, sweet tea, social alcohol   Exercise: She is exercising 3-4 times weekly through boot camp Eye exam: Due in 2020 Dental exam: Completes semi-annually  Pap Smear: Completes through GYN  BP Readings from Last 3 Encounters:  09/13/18 118/76  08/31/18 104/71  07/25/18 118/80      Review of Systems  Constitutional: Negative for unexpected weight change.  HENT: Negative for rhinorrhea.   Respiratory: Negative for cough and shortness of breath.   Cardiovascular: Negative for chest pain.  Gastrointestinal:       Alternating  diarrhea, constipation, abdominal bloating, epigastric and right upper quadrant discomfort.  Chronic symptoms.  Genitourinary: Negative for difficulty urinating.  Musculoskeletal: Negative for arthralgias.  Skin: Negative for rash.  Allergic/Immunologic: Negative for environmental allergies.  Neurological: Negative for dizziness, numbness and headaches.  Psychiatric/Behavioral: The patient is not nervous/anxious.        Past Medical History:  Diagnosis Date  . ADHD   . Asthma    excercise induced  . Chiari I malformation (HCC)   . Decreased fetal movement during pregnancy in third trimester, antepartum 12/05/2014  . Decreased fetal movement in pregnancy in third trimester, antepartum 12/23/2014  . Hyperlipidemia   . IBS (irritable bowel syndrome)   . No pertinent past medical history   . Vaginal Pap smear, abnormal      Social History   Socioeconomic History  . Marital status: Single    Spouse name: Not on file  . Number of children: 1  . Years of education: BA  . Highest education level: Not on file  Occupational History  . 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 Financial resource strain: Not on file  . Food insecurity:    Worry: Not on file    Inability: Not on file  . Transportation needs:    Medical: Not on file    Non-medical: Not on file  Tobacco Use  . Smoking status: Never Smoker  . Smokeless tobacco: Never Used  Substance and Sexual Activity  . Alcohol use: No    Comment: socially  . Drug use: No  . Sexual  activity: Yes    Birth control/protection: None  Lifestyle  . Physical activity:    Days per week: Not on file    Minutes per session: Not on file  . Stress: Not on file  Relationships  . Social connections:    Talks on phone: Not on file    Gets together: Not on file    Attends religious service: Not on file    Active member of club or organization: Not on file    Attends meetings of clubs or organizations: Not on file    Relationship  status: Not on file  . Intimate partner violence:    Fear of current or ex partner: Not on file    Emotionally abused: Not on file    Physically abused: Not on file    Forced sexual activity: Not on file  Other Topics Concern  . Not on file  Social History Narrative   Lives with mother and daughter   Caffeine use: Drinks 1/2 cup per day   Right handed     Past Surgical History:  Procedure Laterality Date  . CESAREAN SECTION N/A 12/25/2014   Procedure: CESAREAN SECTION;  Surgeon: Shea EvansVaishali Mody, MD;  Location: [facility];  Service: Obstetrics;  Laterality: N/A;  . LAPAROSCOPY  03/24/2011   Procedure: LAPAROSCOPY DIAGNOSTIC;  Surgeon: Meriel Picaichard M Holland;  Location: [facility];  Service: Gynecology;  Laterality: N/A;  . 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 LEEP  08/11/2012   Procedure: LOOP ELECTROSURGICAL EXCISION PROCEDURE (LEEP);  Surgeon: Meriel Picaichard M Holland, MD;  Location: [facility];  Service: Gynecology;  Laterality: N/A;  . WISDOM TOOTH EXTRACTION      Family History  Problem Relation Age of Onset  . COPD Mother   . Anxiety disorder Mother   . Dementia Mother     No Known Allergies  Current Outpatient Medications on File Prior to Visit  Medication Sig Dispense Refill  . albuterol (PROVENTIL HFA;VENTOLIN HFA) 108 (90 Base) MCG/ACT inhaler Inhale 2 puffs into the lungs every 6 (six) hours as needed. For wheezing 8.5 Inhaler 1  . dicyclomine (BENTYL) 10 MG capsule Take 10 mg by mouth as needed for spasms.    . famotidine (PEPCID) 20 MG tablet Take 1 tablet (20 mg total) by mouth 2 (two) times daily. (Patient taking differently: Take 20 mg by mouth daily as needed. ) 60 tablet 0  . linaclotide (LINZESS) 72 MCG capsule Take 72 mcg by mouth daily before breakfast.     . omeprazole (PRILOSEC) 20 MG capsule Take 20 mg by  mouth daily as needed.     . ondansetron (ZOFRAN ODT) 4 MG disintegrating tablet Take 1 tablet (4 mg total) by mouth every 8 (eight) hours as needed for nausea or vomiting. 20 tablet 0  . valACYclovir (VALTREX) 500 MG tablet Take 1 tablet by mouth twice daily for 3 days for outbreaks. 6 tablet 1  . ADDERALL XR 20 MG 24 hr capsule Take 1 capsule by mouth daily.      No current facility-administered medications on file prior to visit.     BP 118/76   Pulse 72   Temp 98.2 F (36.8 C) (Oral)   Ht 5' 2.5" (1.588 m)   Wt 179 lb 4 oz (81.3 kg)   SpO2 98%  BMI 32.26 kg/m    Objective:   Physical Exam  Constitutional: She is oriented to person, place, and time. She appears well-nourished.  HENT:  Mouth/Throat: No oropharyngeal exudate.  Eyes: Pupils are equal, round, and reactive to light. EOM are normal.  Neck: Neck supple. No thyromegaly present.  Cardiovascular: Normal rate and regular rhythm.  Respiratory: Effort normal and breath sounds normal.  GI: Soft. Bowel sounds are normal. There is no abdominal tenderness.  Musculoskeletal: Normal range of motion.  Neurological: She is alert and oriented to person, place, and time.  Skin: Skin is warm and dry.  Psychiatric: She has a normal mood and affect.           Assessment & Plan:

## 2018-09-13 NOTE — Assessment & Plan Note (Signed)
Stable, no recent use of albuterol. Continue to monitor.

## 2018-09-13 NOTE — Assessment & Plan Note (Signed)
Chronic elevation in LDL, LDL dated back to 2014 ranging between 150-180. Given recent diagnosis of diabetes, family history of hyperlipidemia and diabetes we should consider statin therapy.  She would like another 6 months to work on lifestyle changes, we will grant her this. Follow-up in 6 months.

## 2018-09-13 NOTE — Assessment & Plan Note (Signed)
Immunizations up-to-date.  Declines influenza vaccination. Pap smear up-to-date, follows with GYN. Recommended regular exercise and healthy diet, encouraged her to continue with exercise. Exam unremarkable. Labs reviewed. Follow-up in 1 year for CPE.

## 2018-09-13 NOTE — Assessment & Plan Note (Addendum)
Could not tolerate metformin given ongoing GI upset.  Recent A1C of 6.4, she has been working on lifestyle changes. Will have her continue lifestyle changes and continue to monitor off medications. Repeat A1C in 3 months, follow-up office visit in 6 months.  Chronic elevation in LDL, LDL dated back to 2014 ranging between 150-180. Given recent diagnosis of diabetes, family history of hyperlipidemia and diabetes we should consider statin therapy.  She would like another 6 months to work on lifestyle changes, we will grant her this.  Continue regular exercise and healthy diet, follow-up in 6 months.

## 2018-09-13 NOTE — Patient Instructions (Signed)
Continue exercising. You should be getting 150 minutes of moderate intensity exercise weekly.  It's important to improve your diet by reducing consumption of fast food, fried food, processed snack foods, sugary drinks. Increase consumption of fresh vegetables and fruits, whole grains, water.  Ensure you are drinking 64 ounces of water daily.  Follow up with GI as scheduled.   You may take the Carafate and Reglan as needed for those GI symptoms. Follow up with GI for refills.  Schedule a lab only appointment for three months to recheck A1C.  Please schedule a follow up appointment in 6 months for diabetes and cholesterol check. Make sure to come fasting four hours prior.  It was a pleasure to see you today!   Preventive Care 18-39 Years, Female Preventive care refers to lifestyle choices and visits with your health care provider that can promote health and wellness. What does preventive care include?   A yearly physical exam. This is also called an annual well check.  Dental exams once or twice a year.  Routine eye exams. Ask your health care provider how often you should have your eyes checked.  Personal lifestyle choices, including: ? Daily care of your teeth and gums. ? Regular physical activity. ? Eating a healthy diet. ? Avoiding tobacco and drug use. ? Limiting alcohol use. ? Practicing safe sex. ? Taking vitamin and mineral supplements as recommended by your health care provider. What happens during an annual well check? The services and screenings done by your health care provider during your annual well check will depend on your age, overall health, lifestyle risk factors, and family history of disease. Counseling Your health care provider may ask you questions about your:  Alcohol use.  Tobacco use.  Drug use.  Emotional well-being.  Home and relationship well-being.  Sexual activity.  Eating habits.  Work and work Statistician.  Method of birth  control.  Menstrual cycle.  Pregnancy history. Screening You may have the following tests or measurements:  Height, weight, and BMI.  Diabetes screening. This is done by checking your blood sugar (glucose) after you have not eaten for a while (fasting).  Blood pressure.  Lipid and cholesterol levels. These may be checked every 5 years starting at age 70.  Skin check.  Hepatitis C blood test.  Hepatitis B blood test.  Sexually transmitted disease (STD) testing.  BRCA-related cancer screening. This may be done if you have a family history of breast, ovarian, tubal, or peritoneal cancers.  Pelvic exam and Pap test. This may be done every 3 years starting at age 55. Starting at age 97, this may be done every 5 years if you have a Pap test in combination with an HPV test. Discuss your test results, treatment options, and if necessary, the need for more tests with your health care provider. Vaccines Your health care provider may recommend certain vaccines, such as:  Influenza vaccine. This is recommended every year.  Tetanus, diphtheria, and acellular pertussis (Tdap, Td) vaccine. You may need a Td booster every 10 years.  Varicella vaccine. You may need this if you have not been vaccinated.  HPV vaccine. If you are 53 or younger, you may need three doses over 6 months.  Measles, mumps, and rubella (MMR) vaccine. You may need at least one dose of MMR. You may also need a second dose.  Pneumococcal 13-valent conjugate (PCV13) vaccine. You may need this if you have certain conditions and were not previously vaccinated.  Pneumococcal polysaccharide (PPSV23) vaccine.  You may need one or two doses if you smoke cigarettes or if you have certain conditions.  Meningococcal vaccine. One dose is recommended if you are age 20-21 years and a first-year college student living in a residence hall, or if you have one of several medical conditions. You may also need additional booster  doses.  Hepatitis A vaccine. You may need this if you have certain conditions or if you travel or work in places where you may be exposed to hepatitis A.  Hepatitis B vaccine. You may need this if you have certain conditions or if you travel or work in places where you may be exposed to hepatitis B.  Haemophilus influenzae type b (Hib) vaccine. You may need this if you have certain risk factors. Talk to your health care provider about which screenings and vaccines you need and how often you need them. This information is not intended to replace advice given to you by your health care provider. Make sure you discuss any questions you have with your health care provider. Document Released: 09/15/2001 Document Revised: 03/02/2017 Document Reviewed: 05/21/2015 Elsevier Interactive Patient Education  2019 [employer].

## 2018-09-14 ENCOUNTER — Encounter: Payer: 59 | Admitting: Primary Care

## 2018-10-17 ENCOUNTER — Telehealth: Payer: Self-pay

## 2018-10-17 DIAGNOSIS — Z20828 Contact with and (suspected) exposure to other viral communicable diseases: Secondary | ICD-10-CM

## 2018-10-17 MED ORDER — OSELTAMIVIR PHOSPHATE 75 MG PO CAPS: 75.0000 mg | ORAL_CAPSULE | Freq: Every day | ORAL | 0 refills | Status: AC

## 2018-10-17 NOTE — Telephone Encounter (Signed)
Noted, Rx for prophylactic dose of Tamiflu sent to pharmacy.

## 2018-10-17 NOTE — Telephone Encounter (Signed)
Pt said her daughter tested positive today for flu and strep.pt does not have fever, non prod cough with yellow phlegm, pt has SOB and wheeze on and off; pt using inhaler and that helps the SOB and wheezing. Pt said she does not need appt. Pt said tamiflu to [facility].

## 2018-10-18 ENCOUNTER — Ambulatory Visit (INDEPENDENT_AMBULATORY_CARE_PROVIDER_SITE_OTHER): Payer: 59 | Admitting: *Deleted

## 2018-10-18 ENCOUNTER — Other Ambulatory Visit: Payer: Self-pay

## 2018-10-18 DIAGNOSIS — Z20828 Contact with and (suspected) exposure to other viral communicable diseases: Secondary | ICD-10-CM | POA: Diagnosis not present

## 2018-10-18 LAB — POC INFLUENZA A&B (BINAX/QUICKVUE)
Influenza A, POC: NEGATIVE
Influenza B, POC: NEGATIVE

## 2018-10-26 ENCOUNTER — Other Ambulatory Visit: Payer: Self-pay

## 2018-10-26 DIAGNOSIS — K582 Mixed irritable bowel syndrome: Secondary | ICD-10-CM

## 2018-10-26 NOTE — Telephone Encounter (Signed)
Last office visit 09/13/2018 for CPE.   Last refilled 02/04/2018 for #20 with no refills.  Next Appt: 03/14/2019 for 6 month follow up.

## 2018-10-26 NOTE — Telephone Encounter (Signed)
See my chart message

## 2018-10-27 MED ORDER — ONDANSETRON 4 MG PO TBDP: 4.0000 mg | ORAL_TABLET | Freq: Three times a day (TID) | ORAL | 0 refills | Status: DC | PRN

## 2018-12-06 ENCOUNTER — Other Ambulatory Visit: Payer: Self-pay | Admitting: Primary Care

## 2018-12-06 DIAGNOSIS — E119 Type 2 diabetes mellitus without complications: Secondary | ICD-10-CM

## 2018-12-08 ENCOUNTER — Telehealth: Payer: Self-pay | Admitting: Primary Care

## 2018-12-08 NOTE — Telephone Encounter (Signed)
Left message askign pt to call office  See below my chart message   Appointment canceled for Brenda Valdez ([id number])  Visit Type: LAB  Date    Time   Length  Provider         Department  12/12/2018  7:45 AM 5 mins.  [facility]       [facility]    Reason for Cancellation: Canceled via MyChart    Patient Comments: Need to reschedule

## 2018-12-12 ENCOUNTER — Other Ambulatory Visit: Payer: 59

## 2019-03-14 ENCOUNTER — Ambulatory Visit: Payer: 59 | Admitting: Primary Care

## 2019-03-14 NOTE — Telephone Encounter (Signed)
[place] Night - Client Nonclinical Telephone Record AccessNurse Client [facility] Night - Client Client Site [place] Physician Alma Friendly - NP Contact Type Call Who Is Calling Patient / Member / Family / Caregiver Caller Name Syriah Delisi Caller Phone Number [phone] Patient Name Brenda Valdez Patient DOB 07-17-1986 Call Type Message Only Information Provided Reason for Call Request to Reschedule Office Appointment Initial Comment Caller states she needs to cancel her 8:00 AM appointment, daughter had a positive Covid exposure, will call back to reschedule. Additional Comment Call Closed By: Baruch Goldmann Transaction Date/Time: 03/14/2019 7:01:52 AM (ET)

## 2019-03-14 NOTE — Telephone Encounter (Signed)
Per pt message Vallarie Mare CMA has taken care of.

## 2019-04-21 ENCOUNTER — Encounter: Payer: Self-pay | Admitting: Primary Care

## 2019-09-27 ENCOUNTER — Telehealth: Payer: 59 | Admitting: Physician Assistant

## 2019-09-27 DIAGNOSIS — N390 Urinary tract infection, site not specified: Secondary | ICD-10-CM | POA: Diagnosis not present

## 2019-09-27 MED ORDER — CEPHALEXIN 500 MG PO CAPS: 500.0000 mg | ORAL_CAPSULE | Freq: Two times a day (BID) | ORAL | 0 refills | Status: AC

## 2019-09-27 NOTE — Progress Notes (Signed)

## 2019-11-30 ENCOUNTER — Telehealth: Payer: Self-pay | Admitting: Primary Care

## 2019-11-30 NOTE — Telephone Encounter (Signed)
Why is this a virtual visit? This would better be addressed in person.

## 2019-11-30 NOTE — Telephone Encounter (Signed)
Geronimo Running is out of the office since 11 AM today. Irving Burton got the mychart note and was asking me about this note. I think this is an in office visit with you on 12/01/19 at 7:40 AM and I doubled cked with Irving Burton to verify this is in office appt. Thank you.

## 2019-11-30 NOTE — Telephone Encounter (Signed)
Pt scheduled an appt on Mychart for 4/30 for Intermittent chest discomfort - pt not sure if related to indigestion.  Dull aching pain. Sometimes will feel the pain in her arm (left). Pt has a history of IBS but has never experienced gas build up pains. Pt does not feel that she needs to go to ED.  "Comes and goes" goes at random times, she cannot pinpoint a specific activity that causes it.   Denies cough, wheezing, SOB, sweating, N/V, sore throat. No pressure in chest.  Pt is wanting to have labs checked -- possibly a Troponin level to just be thorough. She stated that if she cannot be seen today she could come by and do labs today and see Jae Dire tomorrow if she feels the appt is appropriate.   Please advise, thanks.

## 2019-12-01 ENCOUNTER — Ambulatory Visit: Payer: 59 | Admitting: Primary Care

## 2019-12-01 ENCOUNTER — Encounter: Payer: Self-pay | Admitting: Primary Care

## 2019-12-01 ENCOUNTER — Other Ambulatory Visit: Payer: Self-pay

## 2019-12-01 VITALS — BP 122/80 | HR 82 | Temp 96.6°F | Ht 62.5 in | Wt 180.0 lb

## 2019-12-01 DIAGNOSIS — E785 Hyperlipidemia, unspecified: Secondary | ICD-10-CM

## 2019-12-01 DIAGNOSIS — R0789 Other chest pain: Secondary | ICD-10-CM

## 2019-12-01 DIAGNOSIS — E119 Type 2 diabetes mellitus without complications: Secondary | ICD-10-CM | POA: Diagnosis not present

## 2019-12-01 LAB — CBC
HCT: 37.9 % (ref 36.0–46.0)
Hemoglobin: 12.4 g/dL (ref 12.0–15.0)
MCHC: 32.7 g/dL (ref 30.0–36.0)
MCV: 82.7 fl (ref 78.0–100.0)
Platelets: 293 10*3/uL (ref 150.0–400.0)
RBC: 4.59 Mil/uL (ref 3.87–5.11)
RDW: 13.5 % (ref 11.5–15.5)
WBC: 8.4 10*3/uL (ref 4.0–10.5)

## 2019-12-01 LAB — LIPID PANEL
Cholesterol: 224 mg/dL — ABNORMAL HIGH (ref 0–200)
HDL: 32.2 mg/dL — ABNORMAL LOW (ref >39.00)
LDL Cholesterol: 165 mg/dL — ABNORMAL HIGH (ref 0–99)
NonHDL: 191.9
Total CHOL/HDL Ratio: 7
Triglycerides: 135 mg/dL (ref 0.0–149.0)
VLDL: 27 mg/dL (ref 0.0–40.0)

## 2019-12-01 LAB — TSH: TSH: 1.78 u[IU]/mL (ref 0.35–4.50)

## 2019-12-01 LAB — MICROALBUMIN / CREATININE URINE RATIO
Creatinine,U: 368.3 mg/dL
Microalb Creat Ratio: 1 mg/g (ref 0.0–30.0)
Microalb, Ur: 3.5 mg/dL — ABNORMAL HIGH (ref 0.0–1.9)

## 2019-12-01 LAB — COMPREHENSIVE METABOLIC PANEL
ALT: 10 U/L (ref 0–35)
AST: 12 U/L (ref 0–37)
Albumin: 4.4 g/dL (ref 3.5–5.2)
Alkaline Phosphatase: 66 U/L (ref 39–117)
BUN: 11 mg/dL (ref 6–23)
CO2: 29 mEq/L (ref 19–32)
Calcium: 9.2 mg/dL (ref 8.4–10.5)
Chloride: 101 mEq/L (ref 96–112)
Creatinine, Ser: 0.74 mg/dL (ref 0.40–1.20)
GFR: 109.61 mL/min (ref >60.00)
Glucose, Bld: 183 mg/dL — ABNORMAL HIGH (ref 70–99)
Potassium: 4.2 mEq/L (ref 3.5–5.1)
Sodium: 137 mEq/L (ref 135–145)
Total Bilirubin: 0.5 mg/dL (ref 0.2–1.2)
Total Protein: 7.4 g/dL (ref 6.0–8.3)

## 2019-12-01 LAB — HEMOGLOBIN A1C: Hgb A1c MFr Bld: 7.7 % — ABNORMAL HIGH (ref 4.6–6.5)

## 2019-12-01 LAB — TROPONIN I (HIGH SENSITIVITY): High Sens Troponin I: <2 ng/L (ref 2–17)

## 2019-12-01 NOTE — Telephone Encounter (Signed)
Noted.  Patient evaluated. 

## 2019-12-01 NOTE — Patient Instructions (Signed)
Stop by the lab prior to leaving today. I will notify you of your results once received.   Work on stress reduction, weight loss, healthy diet.  It was a pleasure to see you today!

## 2019-12-01 NOTE — Assessment & Plan Note (Addendum)
Acute to left anterior chest x 2 weeks.  ECG today with NSR with rate of 75. No t-wave inversion, ST changes, PAC/PVC. Appears similar to ECG from 2020.  Checking labs today, patient requesting troponin level draw. Discussed to work on stress reduction, weight loss, etc.  Consider statin therapy given elevated LDL with history of diabetes. Repeat lipids pending.

## 2019-12-01 NOTE — Assessment & Plan Note (Addendum)
No recent follow up, no medications for diabetes. Repeat A1C and urine microalbumin pending.  Consider statin therapy if LDL above goal.

## 2019-12-01 NOTE — Progress Notes (Signed)
Subjective:    Patient ID: Brenda Valdez, female    DOB: 09/05/1985, 34 y.o.   MRN: [id number]  HPI  This visit occurred during the SARS-CoV-2 public health emergency.  Safety protocols were in place, including screening questions prior to the visit, additional usage of staff PPE, and extensive cleaning of exam room while observing appropriate contact time as indicated for disinfecting solutions.   Brenda Valdez is a 34 year old female with a history of asthma, IBS, type 2 diabetes, chiari I malformation, hyperlipidemia, chest discomfort who presents today with a chief complaint of chest pain.  BP Readings from Last 3 Encounters:  12/01/19 122/80  09/13/18 118/76  08/31/18 104/71   Her pain is located to the left anterior chest for which she describes as "deep and dull". Her pain is intermittent, hasn't noticed a patter to her chest pain, can come with rest and exertion, lasting a few minutes. This began two weeks ago with intermittent left arm and neck pain. She denies nausea, diaphoresis, dizziness, shortness of breath/wheezing, abdominal pain, fatigue, palpitations. 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   She is managed on Adderall XR 20 mg for which she's taken for years. She's not followed back up for her diabetes. Last A1C of 6.4 in February 2020, she is not taking anything for diabetes. She is under a lot of stress caring for her young daughter and elderly mother.   Wt Readings from Last 3 Encounters:  12/01/19 180 lb (81.6 kg)  09/13/18 179 lb 4 oz (81.3 kg)  08/31/18 180 lb (81.6 kg)     Review of Systems  Constitutional: Negative for diaphoresis and fatigue.  Respiratory: Negative for shortness of breath and wheezing.   Cardiovascular: Positive for chest pain.  Gastrointestinal: Negative for abdominal pain and nausea.  Neurological: Negative for dizziness and headaches.  Psychiatric/Behavioral: The patient is nervous/anxious.       Past Medical History:  Diagnosis Date  . ADHD   . Asthma    excercise induced  . Chiari I malformation ([facility])   . Decreased fetal movement during pregnancy in third trimester, antepartum 12/05/2014  . Decreased fetal movement in pregnancy in third trimester, antepartum 12/23/2014  . Hyperlipidemia   . IBS (irritable bowel syndrome)   . Internal hemorrhoids    noted on colonoscopy from 2020  . No pertinent past medical history   . Vaginal Pap smear, abnormal      Social History   Socioeconomic History  . Marital status: Single    Spouse name: Not on file  . Number of children: 1  . Years of education: BA  . Highest education level: Not on file  Occupational History  . 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 Smoking status: Never Smoker  . Smokeless tobacco: Never Used  Substance and Sexual Activity  . Alcohol use: No    Comment: socially  . Drug use: No  . Sexual activity: Yes    Birth control/protection: None  Other Topics Concern  . Not on file  Social History Narrative   Lives with mother and daughter   Caffeine use: Drinks 1/2 cup per day   Right handed    Social Determinants of Health   Financial Resource Strain:   . Difficulty of Paying Living Expenses:   Food Insecurity:   . Worried About Charity fundraiser in the Last Year:   . Arboriculturist in  the Last Year:   Transportation Needs:   . Freight forwarder (Medical):   Marland Kitchen Lack of Transportation (Non-Medical):   Physical Activity:   . Days of Exercise per Week:   . Minutes of Exercise per Session:   Stress:   . Feeling of Stress :   Social Connections:   . Frequency of Communication with Friends and Family:   . Frequency of Social Gatherings with Friends and Family:   . Attends Religious Services:   . Active Member of Clubs or Organizations:   . Attends Banker Meetings:   Marland Kitchen Marital Status:   Intimate Partner Violence:   . Fear of Current or Ex-Partner:   . Emotionally  Abused:   Marland Kitchen Physically Abused:   . Sexually Abused:     Past Surgical History:  Procedure Laterality Date  . CESAREAN SECTION N/A 12/25/2014   Procedure: CESAREAN SECTION;  Surgeon: Shea Evans, MD;  Location: [facility];  Service: Obstetrics;  Laterality: N/A;  . LAPAROSCOPY  03/24/2011   Procedure: LAPAROSCOPY DIAGNOSTIC;  Surgeon: Meriel Pica;  Location: [facility];  Service: Gynecology;  Laterality: N/A;  . 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 LEEP  08/11/2012   Procedure: LOOP ELECTROSURGICAL EXCISION PROCEDURE (LEEP);  Surgeon: Meriel Pica, MD;  Location: [facility];  Service: Gynecology;  Laterality: N/A;  . WISDOM TOOTH EXTRACTION      Family History  Problem Relation Age of Onset  . COPD Mother   . Anxiety disorder Mother   . Dementia Mother     No Known Allergies  Current Outpatient Medications on File Prior to Visit  Medication Sig Dispense Refill  . ADDERALL XR 20 MG 24 hr capsule Take 1 capsule by mouth daily.     Marland Kitchen albuterol (PROVENTIL HFA;VENTOLIN HFA) 108 (90 Base) MCG/ACT inhaler Inhale 2 puffs into the lungs every 6 (six) hours as needed. For wheezing 8.5 Inhaler 1  . famotidine (PEPCID) 20 MG tablet Take 1 tablet (20 mg total) by mouth 2 (two) times daily. (Patient taking differently: Take 20 mg by mouth daily as needed. ) 60 tablet 0  . linaclotide (LINZESS) 72 MCG capsule Take 72 mcg by mouth daily before breakfast.     . omeprazole (PRILOSEC) 20 MG capsule Take 20 mg by mouth daily as needed.     . ondansetron (ZOFRAN ODT) 4 MG disintegrating tablet Take 1 tablet (4 mg total) by mouth every 8 (eight) hours as needed for nausea or vomiting. 20 tablet 0  . valACYclovir (VALTREX) 500 MG tablet Take 1 tablet by mouth twice daily for 3 days for outbreaks. 6 tablet 1   No current  facility-administered medications on file prior to visit.    BP 122/80   Pulse 82   Temp (!) 96.6 F (35.9 C) (Temporal)   Ht 5' 2.5" (1.588 m)   Wt 180 lb (81.6 kg)   SpO2 98%   BMI 32.40 kg/m    Objective:   Physical Exam  Constitutional: She appears well-nourished.  Cardiovascular: Normal rate and regular rhythm.  Respiratory: Effort normal and breath sounds normal.  Musculoskeletal:     Cervical back: Neck supple.  Skin: Skin is warm and dry.  Psychiatric: She has a normal mood and affect.  Assessment & Plan:

## 2019-12-01 NOTE — Assessment & Plan Note (Signed)
Repeat lipids pending. Consider statin therapy in the setting of diabetes and also her intermittent chest pain.

## 2019-12-03 DIAGNOSIS — E119 Type 2 diabetes mellitus without complications: Secondary | ICD-10-CM

## 2019-12-04 MED ORDER — METFORMIN HCL ER 500 MG PO TB24: 500.0000 mg | ORAL_TABLET | Freq: Every day | ORAL | 1 refills | Status: DC

## 2019-12-07 MED ORDER — ONETOUCH DELICA PLUS LANCET33G MISC: 5 refills | Status: DC

## 2019-12-07 MED ORDER — ONETOUCH VERIO VI STRP: ORAL_STRIP | 5 refills | Status: DC

## 2019-12-07 NOTE — Telephone Encounter (Signed)
FYI

## 2019-12-07 NOTE — Telephone Encounter (Signed)
Brenda Valdez, please refill test strips/lancets. I can't remember which one she needs.

## 2019-12-19 ENCOUNTER — Other Ambulatory Visit: Payer: Self-pay

## 2019-12-19 ENCOUNTER — Encounter: Payer: 59 | Attending: Primary Care | Admitting: Dietician

## 2019-12-19 ENCOUNTER — Encounter: Payer: Self-pay | Admitting: Dietician

## 2019-12-19 VITALS — Ht 62.0 in | Wt 177.3 lb

## 2019-12-19 DIAGNOSIS — E119 Type 2 diabetes mellitus without complications: Secondary | ICD-10-CM | POA: Insufficient documentation

## 2019-12-19 NOTE — Progress Notes (Signed)
Medical Nutrition Therapy: Visit start time: 1545  end time: 1700  Assessment:  Diagnosis: diabetes and hyperlipidemia Past medical history: GERD, GI distress Psychosocial issues/ stress concerns: none  Preferred learning method:  . Auditory . Visual . Hands-on  Current weight: 177.3 lbs  Height: 5'2" Medications, supplements: reconciled in medical record   Progress and evaluation:  Pt attributes lack of appetite to adderall.  Pt also reports GI issues since 2017; symptoms include a sour belch, vomitting, diarrhea. Pt reports reglan and pepto-bismol help with some of the symptoms.  Pt reports carbonated beverages and wings seem to make symptoms worse.     Pt has made several diet changes that she has sustained since having gestational diabetes with her last pregnancy- no longer eats late night snack of oreos and no longer drinks sweet tea.  Pt states in 2018 she lost 30 lbs going to the gym consistently.    Pt reports fasting BGs usually 150-250 in the morning with two recent readings of 256 and 266; evening around 130s.  Pt reports high blood sugars in the mornings most mornings.   Physical activity: 1-2x/week for 20-30 min  Dietary Intake:  Usual eating pattern includes 1-2 meals and 1 snacks per day. Dining out frequency: 4-7 meals per week.  Breakfast: coffee only OR sometimes greek yogurt with berries and granola Snack: not usual/jelly beans  Lunch: usually skip lunch /panera chicken caesar salad/catered meal at work/PB&J on wheat bread  Snack: same as above Supper: take out [employer] with brussel sprouts and sweet potato/sometimes skips  Snack: same as above  Beverages: 8 oz fruit juice, water   Nutrition Care Education:  Basic nutrition: basic food groups, appropriate nutrient balance, appropriate meal and snack schedule, general nutrition guidelines    Weight control: identifying healthy weight, determining reasonable weight loss rate, importance of low sugar and  low fat choices, portion control strategies Advanced nutrition:  food label reading Diabetes:  goals for BGs, appropriate meal and snack schedule, appropriate carb intake and balance, healthy carb choices, role of fiber, protein, fat, impact of exercise  Heart Healthy:  healthy and unhealthy fats, role of fiber, role of exercise  Nutritional Diagnosis:  [place]-2.2 Altered nutrition-related laboratory As related to diabetes.  As evidenced by pt A1c of 7.7 on 12/01/19. [place]-1.4 Inadequate energy intake As related to decreased ability to consume sufficient energy.  As evidenced by pt reported medication affecting appetite and estimated energy intake from diet less than needs based on estimated needs.  Intervention:   Instruction and discussion as noted above  Pt's largest challenge will be eating three meals a day- smoothie in the morning suggested   Pt needs to consume at least 1200 calories daily   Informed pt that exercise is not advised for blood sugars >250  Established goals for additional changes  Education Materials given:  . General diet guidelines for Diabetes . NCM Heart Healthy, Carb Consistent handout  . Food record handout . Carb Mindful smoothie handout . Plate Planner with food lists . Snacking handout . Goals/ instructions   Learner/ who was taught:  . Patient   Level of understanding: Marland Kitchen Verbalizes/ demonstrates competency  Demonstrated degree of understanding via:   Teach back Learning barriers: . None  Willingness to learn/ readiness for change: . Eager, change in progress  Monitoring and Evaluation:  Dietary intake, exercise, BGs, and body weight      follow up: In 6 weeks

## 2019-12-19 NOTE — Patient Instructions (Addendum)
   Try eating three meals a day, even if it's something small   Increase water intake    Try to incorporate more physical activity as long as BGs are below 250  Try to get a snack in before bed

## 2020-01-30 ENCOUNTER — Ambulatory Visit: Payer: 59 | Admitting: Dietician

## 2020-02-06 ENCOUNTER — Ambulatory Visit: Payer: 59 | Admitting: Dietician

## 2020-02-29 ENCOUNTER — Other Ambulatory Visit: Payer: Self-pay

## 2020-02-29 ENCOUNTER — Ambulatory Visit: Payer: 59 | Admitting: Primary Care

## 2020-02-29 ENCOUNTER — Encounter: Payer: Self-pay | Admitting: Primary Care

## 2020-02-29 VITALS — BP 112/72 | HR 82 | Temp 95.6°F | Ht 62.5 in | Wt 177.0 lb

## 2020-02-29 DIAGNOSIS — E119 Type 2 diabetes mellitus without complications: Secondary | ICD-10-CM

## 2020-02-29 DIAGNOSIS — E785 Hyperlipidemia, unspecified: Secondary | ICD-10-CM | POA: Diagnosis not present

## 2020-02-29 DIAGNOSIS — Z800 Family history of malignant neoplasm of digestive organs: Secondary | ICD-10-CM | POA: Diagnosis not present

## 2020-02-29 DIAGNOSIS — K582 Mixed irritable bowel syndrome: Secondary | ICD-10-CM

## 2020-02-29 LAB — COMPREHENSIVE METABOLIC PANEL
ALT: 13 U/L (ref 0–35)
AST: 15 U/L (ref 0–37)
Albumin: 4.3 g/dL (ref 3.5–5.2)
Alkaline Phosphatase: 50 U/L (ref 39–117)
BUN: 17 mg/dL (ref 6–23)
CO2: 26 mEq/L (ref 19–32)
Calcium: 9.4 mg/dL (ref 8.4–10.5)
Chloride: 103 mEq/L (ref 96–112)
Creatinine, Ser: 0.78 mg/dL (ref 0.40–1.20)
GFR: 102.99 mL/min (ref >60.00)
Glucose, Bld: 176 mg/dL — ABNORMAL HIGH (ref 70–99)
Potassium: 4.2 mEq/L (ref 3.5–5.1)
Sodium: 135 mEq/L (ref 135–145)
Total Bilirubin: 0.5 mg/dL (ref 0.2–1.2)
Total Protein: 7.6 g/dL (ref 6.0–8.3)

## 2020-02-29 LAB — CBC
HCT: 35 % — ABNORMAL LOW (ref 36.0–46.0)
Hemoglobin: 11.6 g/dL — ABNORMAL LOW (ref 12.0–15.0)
MCHC: 33.2 g/dL (ref 30.0–36.0)
MCV: 83.3 fl (ref 78.0–100.0)
Platelets: 266 10*3/uL (ref 150.0–400.0)
RBC: 4.2 Mil/uL (ref 3.87–5.11)
RDW: 13.8 % (ref 11.5–15.5)
WBC: 10.1 10*3/uL (ref 4.0–10.5)

## 2020-02-29 LAB — LIPASE: Lipase: 15 U/L (ref 11.0–59.0)

## 2020-02-29 LAB — LIPID PANEL
Cholesterol: 223 mg/dL — ABNORMAL HIGH (ref 0–200)
HDL: 39.6 mg/dL (ref >39.00)
LDL Cholesterol: 169 mg/dL — ABNORMAL HIGH (ref 0–99)
NonHDL: 183.7
Total CHOL/HDL Ratio: 6
Triglycerides: 76 mg/dL (ref 0.0–149.0)
VLDL: 15.2 mg/dL (ref 0.0–40.0)

## 2020-02-29 LAB — HEMOGLOBIN A1C: Hgb A1c MFr Bld: 7 % — ABNORMAL HIGH (ref 4.6–6.5)

## 2020-02-29 NOTE — Assessment & Plan Note (Signed)
Diagnosed in her maternal aunt and three first cousins from different parents.   CT abdomen/pelvis from January 2020 reviewed. Checking lipase and CBC today.  Will consult with GI to learn of any screening testing.

## 2020-02-29 NOTE — Patient Instructions (Signed)
Stop by the lab prior to leaving today. I will notify you of your results once received.   It is important that you improve your diet. Please limit carbohydrates in the form of white bread, rice, pasta, sweets, fast food, fried food, sugary drinks, etc. Increase your consumption of fresh fruits and vegetables, whole grains, lean protein.  Ensure you are consuming 64 ounces of water daily.  Continue exercising. You should be getting 150 minutes of moderate intensity exercise weekly.  It was a pleasure to see you today!

## 2020-02-29 NOTE — Assessment & Plan Note (Signed)
Compliant to metformin five days weekly only, due to symptoms of diarrhea despite XR version. Will need to change to maybe Januvia or Trulicity.   Repeat A1C pending, await results first. Continue meeting with diabetic nutritionist.

## 2020-02-29 NOTE — Assessment & Plan Note (Signed)
Follows with GI. FH of pancreatic cancer in several members. Lipase and CBC pending. CT abdomen/pelvis reviewed from 2020, pancreas is normal.

## 2020-02-29 NOTE — Assessment & Plan Note (Signed)
She has been working on lifestyle changes, repeat lipids pending.

## 2020-02-29 NOTE — Progress Notes (Signed)
Subjective:    Patient ID: Brenda Valdez, female    DOB: 28-Nov-1985, 34 y.o.   MRN: [id number]  HPI  This visit occurred during the SARS-CoV-2 public health emergency.  Safety protocols were in place, including screening questions prior to the visit, additional usage of staff PPE, and extensive cleaning of exam room while observing appropriate contact time as indicated for disinfecting solutions.   Brenda Valdez is a 34 year old female with a history of type 2 diabetes, chiari I malformation, asthma, hyperlipidemia, ADHD managed on stimulants who presents today with a chief complaint of elevated blood pressure readings. She is also concerned about the potential of developing pancreatic cancer.  She's following with OB/GYN, has been seeing readings of 130's/70's during visits. She was recently found to have high grade dysplasia on a recent pap smear. Also with pedal edema while on vacation, was eating more salty foods/seafood. She's checked her BP at work a few times which runs 120's-130's/70's.  A1C of 7.7 in late April 2021, prescribed metformin XR and takes five days weekly on average due to intolerable effects of diarrhea. She has seen a diabetic nutritionist, is also checking her glucose throughout the day which is running high 100's to low 200's.    Lipid panel in late April 2021 with LDL of 165, Trigs 135. She is trying to work on her diet and exercise.   Strong family history of pancreatic cancer, just found out another family member was diagnosed: maternal aunt and three first cousins by different parents. She underwent CT abdomen/pelivs in January 2020 which showed an unremarkable pancreas. She is wondering about routine imaging on herself.   BP Readings from Last 3 Encounters:  02/29/20 112/72  12/01/19 122/80  09/13/18 118/76     Review of Systems  Eyes: Negative for visual disturbance.  Respiratory: Negative for shortness of breath.   Cardiovascular: Negative for chest pain.    Neurological: Negative for numbness.       Past Medical History:  Diagnosis Date  . ADHD   . Asthma    excercise induced  . Chiari I malformation (HCC)   . Decreased fetal movement during pregnancy in third trimester, antepartum 12/05/2014  . Decreased fetal movement in pregnancy in third trimester, antepartum 12/23/2014  . Hyperlipidemia   . IBS (irritable bowel syndrome)   . Internal hemorrhoids    noted on colonoscopy from 2020  . No pertinent past medical history   . Vaginal Pap smear, abnormal      Social History   Socioeconomic History  . Marital status: Single    Spouse name: Not on file  . Number of children: 1  . Years of education: BA  . Highest education level: Not on file  Occupational History  . 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 Smoking status: Never Smoker  . Smokeless tobacco: Never Used  Vaping Use  . Vaping Use: Never used  Substance and Sexual Activity  . Alcohol use: No    Comment: socially  . Drug use: No  . Sexual activity: Yes    Birth control/protection: None  Other Topics Concern  . Not on file  Social History Narrative   Lives with mother and daughter   Caffeine use: Drinks 1/2 cup per day   Right handed    Social Determinants of Health   Financial Resource Strain:   . Difficulty of Paying Living Expenses:   Food Insecurity:   . Worried About [employer] of  Food in the Last Year:   . Ran Out of Food in the Last Year:   Transportation Needs:   . Freight forwarder (Medical):   Marland Kitchen Lack of Transportation (Non-Medical):   Physical Activity:   . Days of Exercise per Week:   . Minutes of Exercise per Session:   Stress:   . Feeling of Stress :   Social Connections:   . Frequency of Communication with Friends and Family:   . Frequency of Social Gatherings with Friends and Family:   . Attends Religious Services:   . Active Member of Clubs or Organizations:   . Attends Banker Meetings:   Marland Kitchen Marital  Status:   Intimate Partner Violence:   . Fear of Current or Ex-Partner:   . Emotionally Abused:   Marland Kitchen Physically Abused:   . Sexually Abused:     Past Surgical History:  Procedure Laterality Date  . CESAREAN SECTION N/A 12/25/2014   Procedure: CESAREAN SECTION;  Surgeon: Shea Evans, MD;  Location: [facility];  Service: Obstetrics;  Laterality: N/A;  . LAPAROSCOPY  03/24/2011   Procedure: LAPAROSCOPY DIAGNOSTIC;  Surgeon: Meriel Pica;  Location: [facility];  Service: Gynecology;  Laterality: N/A;  . 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 LEEP  08/11/2012   Procedure: LOOP ELECTROSURGICAL EXCISION PROCEDURE (LEEP);  Surgeon: Meriel Pica, MD;  Location: [facility];  Service: Gynecology;  Laterality: N/A;  . WISDOM TOOTH EXTRACTION      Family History  Problem Relation Age of Onset  . COPD Mother   . Anxiety disorder Mother   . Dementia Mother     No Known Allergies  Current Outpatient Medications on File Prior to Visit  Medication Sig Dispense Refill  . albuterol (PROVENTIL HFA;VENTOLIN HFA) 108 (90 Base) MCG/ACT inhaler Inhale 2 puffs into the lungs every 6 (six) hours as needed. For wheezing 8.5 Inhaler 1  . etonogestrel (NEXPLANON) 68 MG IMPL implant 68 mg.    . famotidine (PEPCID) 20 MG tablet Take 1 tablet (20 mg total) by mouth 2 (two) times daily. (Patient taking differently: Take 20 mg by mouth daily as needed. ) 60 tablet 0  . glucose blood (ONETOUCH VERIO) test strip Use as instructed to check blood sugar up to 3 times a day 100 each 5  . Lancets (ONETOUCH DELICA PLUS LANCET33G) MISC Use as instructed to check blood sugar up to 3 times a day 100 each 5  . linaclotide (LINZESS) 72 MCG capsule Take 72 mcg by mouth daily before breakfast.     . metFORMIN (GLUCOPHAGE-XR) 500 MG 24 hr tablet Take 1 tablet  (500 mg total) by mouth daily with breakfast. For diabetes. 90 tablet 1  . Multiple Vitamin (MULTIVITAMIN) capsule Take by mouth.    Marland Kitchen MYDAYIS 25 MG CP24 Take 1 capsule by mouth daily.    Marland Kitchen omeprazole (PRILOSEC) 20 MG capsule Take 20 mg by mouth daily as needed.     . ondansetron (ZOFRAN ODT) 4 MG disintegrating tablet Take 1 tablet (4 mg total) by mouth every 8 (eight) hours as needed for nausea or vomiting. 20 tablet 0  . valACYclovir (VALTREX) 500 MG tablet Take 1 tablet by mouth twice daily for 3 days for outbreaks. 6 tablet 1   No current facility-administered medications on file prior  to visit.    BP 112/72   Pulse 82   Temp (!) 95.6 F (35.3 C) (Temporal)   Ht 5' 2.5" (1.588 m)   Wt 177 lb (80.3 kg)   SpO2 98%   BMI 31.86 kg/m    Objective:   Physical Exam Cardiovascular:     Rate and Rhythm: Normal rate and regular rhythm.  Pulmonary:     Effort: Pulmonary effort is normal.     Breath sounds: Normal breath sounds.  Musculoskeletal:     Cervical back: Neck supple.  Skin:    General: Skin is warm and dry.            Assessment & Plan:

## 2020-03-04 ENCOUNTER — Other Ambulatory Visit: Payer: Self-pay

## 2020-03-04 ENCOUNTER — Encounter: Payer: 59 | Attending: Primary Care | Admitting: Dietician

## 2020-03-04 DIAGNOSIS — E119 Type 2 diabetes mellitus without complications: Secondary | ICD-10-CM | POA: Diagnosis present

## 2020-03-04 NOTE — Patient Instructions (Addendum)
   Ride your bike 2-3x/week   Drinking water 6-8 cups   Start a modified low FODMAP diet

## 2020-03-04 NOTE — Progress Notes (Signed)
Medical Nutrition Therapy: Visit start time: 0900  end time: 1000  Assessment:  Diagnosis: type 2 diabetes Medical history changes: no longer taking adderall Psychosocial issues/ stress concerns: none   Current weight: 178.3 lbs  Height: 5'2" Medications, supplement changes: reconciled in medical record    Progress and evaluation:  . A1c- 7.0, 02/29/20; previous 7.7, 3 months ago . Pt reports some GI upset with Metformin, may try different medication for BG control  . Pt reports no longer taking adderall, she has noticed appetite is better now  . Pt reports not having enough time to try smoothie for breakfast; did eat breakfast 4 of 5 days of the work week last week  o Usually yogurt with granola; sausage with egg whites . Pt states gastroenterologist provided some information about low FODMAP diet to manage IBS w/constipation . Pt states sour belch and acid reflux have not flared up recently . Pt ordered a peloton stationary bike and has started using it    Physical activity: last week started, 20-30 min on peloton 2-3x/week   Nutrition Care Education: Weight control: reviewed progress since previous visit, tracking food intake Advanced nutrition:   food label reading, low FODMAP diet Diabetes:  goals for BGs, appropriate meal and snack schedule, appropriate carb intake and balance, healthy carb choices for lower glycemic response, role of fiber, protein Hyperlipidemia:  healthy and unhealthy fats  Nutritional Diagnosis:  [place]-1.4 Altered GI function As related to IBS with constipation and delayed gastric emptying.  As evidenced by pt report of intermittent diarrhea and constipation..  Intervention:  Praised pt for progress with eating in more regular intervals and drinking more water.  Explained the three stages of the low FODMAP diet and discussed if pt felt like this therapeutic diet would be helpful to pinpoint trigger foods.  Pt tolerates lactose well and not sure about other high  FODMAP foods.  Recommend pt begin modified, low FODMAP diet to potentially improve GI symptoms.    Delayed gastric emptying . Small meals, chew thoroughly  . Low fiber foods  . Avoid fried foods, high fat foods   Stress management  At follow up review, mindful eating techniques   Review quality sleep guidelines   Review link between stress and BGs and weight   Education Materials given:  Marland Kitchen Low FODMAP diet . Goals/ instructions  Learner/ who was taught:  . Patient   Level of understanding: Marland Kitchen Verbalizes/ demonstrates competency  Demonstrated degree of understanding via:   Teach back Learning barriers: . None  Willingness to learn/ readiness for change: . Eager, change in progress  Monitoring and Evaluation:  Dietary intake, exercise, A1c, and body weight      follow up: 4-6 weeks

## 2020-04-29 ENCOUNTER — Ambulatory Visit: Payer: 59 | Admitting: Dietician

## 2020-06-21 ENCOUNTER — Other Ambulatory Visit: Payer: Self-pay | Admitting: Primary Care

## 2020-06-21 DIAGNOSIS — E119 Type 2 diabetes mellitus without complications: Secondary | ICD-10-CM

## 2020-08-01 ENCOUNTER — Ambulatory Visit (HOSPITAL_COMMUNITY)
Admission: EM | Admit: 2020-08-01 | Discharge: 2020-08-01 | Disposition: A | Discharge status: Home / Self Care | Payer: 59

## 2020-08-01 ENCOUNTER — Emergency Department (HOSPITAL_COMMUNITY)
Admission: EM | Admit: 2020-08-01 | Discharge: 2020-08-02 | Disposition: A | Discharge status: Home / Self Care | Payer: 59 | Attending: Emergency Medicine | Admitting: Emergency Medicine

## 2020-08-01 ENCOUNTER — Other Ambulatory Visit: Payer: Self-pay

## 2020-08-01 DIAGNOSIS — E119 Type 2 diabetes mellitus without complications: Secondary | ICD-10-CM | POA: Insufficient documentation

## 2020-08-01 DIAGNOSIS — R079 Chest pain, unspecified: Secondary | ICD-10-CM

## 2020-08-01 DIAGNOSIS — Z7984 Long term (current) use of oral hypoglycemic drugs: Secondary | ICD-10-CM | POA: Diagnosis not present

## 2020-08-01 DIAGNOSIS — J4520 Mild intermittent asthma, uncomplicated: Secondary | ICD-10-CM | POA: Diagnosis not present

## 2020-08-01 LAB — CBC
HCT: 39 % (ref 36.0–46.0)
Hemoglobin: 12.3 g/dL (ref 12.0–15.0)
MCH: 26.9 pg (ref 26.0–34.0)
MCHC: 31.5 g/dL (ref 30.0–36.0)
MCV: 85.2 fL (ref 80.0–100.0)
Platelets: 296 10*3/uL (ref 150–400)
RBC: 4.58 MIL/uL (ref 3.87–5.11)
RDW: 12.6 % (ref 11.5–15.5)
WBC: 10.6 10*3/uL — ABNORMAL HIGH (ref 4.0–10.5)
nRBC: 0 % (ref 0.0–0.2)

## 2020-08-01 LAB — BASIC METABOLIC PANEL
Anion gap: 13 (ref 5–15)
BUN: 9 mg/dL (ref 6–20)
CO2: 20 mmol/L — ABNORMAL LOW (ref 22–32)
Calcium: 9.3 mg/dL (ref 8.9–10.3)
Chloride: 101 mmol/L (ref 98–111)
Creatinine, Ser: 0.72 mg/dL (ref 0.44–1.00)
GFR, Estimated: >60 mL/min (ref >60)
Glucose, Bld: 123 mg/dL — ABNORMAL HIGH (ref 70–99)
Potassium: 3.5 mmol/L (ref 3.5–5.1)
Sodium: 134 mmol/L — ABNORMAL LOW (ref 135–145)

## 2020-08-01 LAB — I-STAT BETA HCG BLOOD, ED (MC, WL, AP ONLY): I-stat hCG, quantitative: <5 m[IU]/mL (ref <5)

## 2020-08-01 LAB — TROPONIN I (HIGH SENSITIVITY)
Troponin I (High Sensitivity): 3 ng/L (ref <18)
Troponin I (High Sensitivity): <2 ng/L (ref <18)

## 2020-08-01 NOTE — Telephone Encounter (Signed)
Called and spoke with patient concerning her symptoms. Patient stated that she has been having chest discomfort, mostly pressure that has been occurring for two days on and off. Patient stated that she is also having pain under her L shoulder blade that radiates to her L arm. Patient stated that this pain is a throbbing pain in her L arm and rates it as a 2/10. Patient denies SOB, sweaty, lightheaded or dizziness. 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 Encouraged patient to report to ED as soon as possible or calling 911. Patient stated that she did not want to go to ED, but she was willing to go to an UC. Patient stated that she was headed to [facility] now. Informed patient that they close at 2000. Instructed patient that if she develops new or worsening symptoms, she needs to report to the ED or call 911. Patient verbalized understanding.

## 2020-08-01 NOTE — ED Triage Notes (Signed)
Pt presents to ED POV. Pt c/o L CP that radiates towards L arm and neck. Pt reports that pain is a 2/10 and pain began at rest. No cardiac hx, extra stress reported.

## 2020-08-02 ENCOUNTER — Emergency Department (HOSPITAL_COMMUNITY): Payer: 59

## 2020-08-02 NOTE — Discharge Instructions (Addendum)
Work-up today was reassuring.  Copy of labs on back for your doctor if needed. Monitor symptoms closely-- return here for any new/acute changes.

## 2020-08-02 NOTE — ED Notes (Signed)
Pt d/c by MD and is provided w/ d/c instructions and follow up care, pt is out of the ED ambulatory

## 2020-08-02 NOTE — ED Provider Notes (Signed)
[facility] EMERGENCY DEPARTMENT Provider Note   CSN: [id number] Arrival date & time: 08/01/20  1949     History Chief Complaint  Patient presents with  . Chest Pain    Brenda Valdez is a 34 y.o. female.  The history is provided by the patient and medical records.    34 year old female with history of ADHD, asthma, hyperlipidemia not currently on medication, presenting to the ED with chest pain.  States over the past 2 days she has had intermittent left-sided chest pain with radiation to the left shoulder and into the left shoulder blade.  This is notable with palpation but character of pain does not change.  No pleuritic type pain with deep breathing, palpitations, or feeling SOB.  No cough, fever, or URI symptoms.  States she has been udner increased stress--- states friend of her had chest pain 2 weeks died, had sudden Vfib arrest in the ED and died unexpectedly, she attended her funeral this morning.  This in itself made her somewhat anxious/nervous about her symptoms.  No personal cardiac history.  She is not a smoker.  No drug use.  Past Medical History:  Diagnosis Date  . ADHD   . Asthma    excercise induced  . Chiari I malformation (HCC)   . Decreased fetal movement during pregnancy in third trimester, antepartum 12/05/2014  . Decreased fetal movement in pregnancy in third trimester, antepartum 12/23/2014  . Hyperlipidemia   . IBS (irritable bowel syndrome)   . Internal hemorrhoids    noted on colonoscopy from 2020  . No pertinent past medical history   . Vaginal Pap smear, abnormal     Patient Active Problem List   Diagnosis Date Noted  . Family history of pancreatic cancer 02/29/2020  . IBS (irritable bowel syndrome) 09/13/2018  . Type 2 diabetes mellitus (HCC) 09/13/2018  . Preventative health care 09/13/2018  . Mild intermittent asthma without complication 09/10/2017  . Genital herpes simplex 09/10/2017  . Chiari I malformation (HCC) 09/10/2017   . Hyperlipidemia 09/10/2017  . Chest discomfort 09/10/2017    Past Surgical History:  Procedure Laterality Date  . CESAREAN SECTION N/A 12/25/2014   Procedure: CESAREAN SECTION;  Surgeon: Shea Evans, MD;  Location: [facility];  Service: Obstetrics;  Laterality: N/A;  . LAPAROSCOPY  03/24/2011   Procedure: LAPAROSCOPY DIAGNOSTIC;  Surgeon: Meriel Pica;  Location: [facility];  Service: Gynecology;  Laterality: N/A;  . 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 LEEP  08/11/2012   Procedure: LOOP ELECTROSURGICAL EXCISION PROCEDURE (LEEP);  Surgeon: Meriel Pica, MD;  Location: [facility];  Service: Gynecology;  Laterality: N/A;  . WISDOM TOOTH EXTRACTION       OB History    Gravida  2   Para  1   Term  1   Preterm      AB  1   Living  1     SAB  1   IAB      Ectopic      Multiple  0   Live Births  1           Family History  Problem Relation Age of Onset  . COPD Mother   . Anxiety disorder Mother   . Dementia Mother     Social History  Tobacco Use  . Smoking status: Never Smoker  . Smokeless tobacco: Never Used  Vaping Use  . Vaping Use: Never used  Substance Use Topics  . Alcohol use: No    Comment: socially  . Drug use: No    Home Medications Prior to Admission medications   Medication Sig Start Date End Date Taking? Authorizing Provider  albuterol (PROVENTIL HFA;VENTOLIN HFA) 108 (90 Base) MCG/ACT inhaler Inhale 2 puffs into the lungs every 6 (six) hours as needed. For wheezing 09/07/18   [name], NP  etonogestrel (NEXPLANON) 68 MG IMPL implant 68 mg.    [provider]  famotidine (PEPCID) 20 MG tablet Take 1 tablet (20 mg total) by mouth 2 (two) times daily. Patient taking differently: Take 20 mg by mouth daily as needed.  10/07/17   Irean Hong, MD   glucose blood (ONETOUCH VERIO) test strip Use as instructed to check blood sugar up to 3 times a day 12/07/19   [name], NP  Lancets [facility] DELICA PLUS [place]) MISC Use as instructed to check blood sugar up to 3 times a day 12/07/19   [name], NP  linaclotide [facility]) 72 MCG capsule Take 72 mcg by mouth daily before breakfast.  07/12/18   [provider]  metFORMIN (GLUCOPHAGE-XR) 500 MG 24 hr tablet TAKE 1 TABLET (500 MG TOTAL) BY MOUTH DAILY WITH BREAKFAST. FOR DIABETES. 06/21/20   [name], NP  Multiple Vitamin (MULTIVITAMIN) capsule Take by mouth.    [provider]  MYDAYIS 25 MG CP24 Take 1 capsule by mouth daily. 02/06/20   [provider]  omeprazole (PRILOSEC) 20 MG capsule Take 20 mg by mouth daily as needed.     [provider]  ondansetron (ZOFRAN ODT) 4 MG disintegrating tablet Take 1 tablet (4 mg total) by mouth every 8 (eight) hours as needed for nausea or vomiting. 10/27/18   [name], NP  valACYclovir (VALTREX) 500 MG tablet Take 1 tablet by mouth twice daily for 3 days for outbreaks. 06/09/18   [name], NP    Allergies    Patient has no known allergies.  Review of Systems   Review of Systems  Cardiovascular: Positive for chest pain.  All other systems reviewed and are negative.   Physical Exam Updated Vital Signs BP 120/76 (BP Location: Right Arm)   Pulse 85   Temp 98.6 F (37 C) (Oral)   Resp 14   SpO2 100%   Physical Exam Vitals and nursing note reviewed.  Constitutional:      Appearance: She is well-developed and well-nourished.  HENT:     Head: Normocephalic and atraumatic.     Mouth/Throat:     Mouth: Oropharynx is clear and moist.  Eyes:     Extraocular Movements: EOM normal.     Conjunctiva/sclera: Conjunctivae normal.     Pupils: Pupils are equal, round, and reactive to light.  Cardiovascular:     Rate and Rhythm: Normal rate and regular rhythm.     Heart  sounds: Normal heart sounds.  Pulmonary:     Effort: Pulmonary effort is normal.     Breath sounds: Normal breath sounds.  Chest:     Comments: Pain elicited with deep palpation of left chest near shoulder, no deformity or signs of trauma appreciated Abdominal:     General: Bowel sounds are normal.     Palpations: Abdomen is soft.  Musculoskeletal:        General:  Normal range of motion.     Cervical back: Normal range of motion.  Skin:    General: Skin is warm and dry.  Neurological:     Mental Status: She is alert and oriented to person, place, and time.  Psychiatric:        Mood and Affect: Mood and affect normal.     ED Results / Procedures / Treatments   Labs (all labs ordered are listed, but only abnormal results are displayed) Labs Reviewed  BASIC METABOLIC PANEL - Abnormal; Notable for the following components:      Result Value   Sodium 134 (*)    CO2 20 (*)    Glucose, Bld 123 (*)    All other components within normal limits  CBC - Abnormal; Notable for the following components:   WBC 10.6 (*)    All other components within normal limits  I-STAT BETA HCG BLOOD, ED ([facility], [facility], [facility] ONLY)  TROPONIN I (HIGH SENSITIVITY)  TROPONIN I (HIGH SENSITIVITY)    EKG None  Radiology DG Chest 2 View  Result Date: 08/02/2020 CLINICAL DATA:  Chest pain EXAM: CHEST - 2 VIEW COMPARISON:  10/07/2017 FINDINGS: The heart size and mediastinal contours are within normal limits. Both lungs are clear. The visualized skeletal structures are unremarkable. IMPRESSION: No active cardiopulmonary disease. Electronically Signed   By: Helyn Numbers MD   On: 08/02/2020 00:58    Procedures Procedures (including critical care time)  Medications Ordered in ED Medications - No data to display  ED Course  I have reviewed the triage vital signs and the nursing notes.  Pertinent labs & imaging results that were available during my care of the patient were reviewed by me and considered in my  medical decision making (see chart for details).    MDM Rules/Calculators/A&P  34 year old female here with 2 days of intermittent chest pain.  Pain is elicited with palpation along the chest wall.  Does report some added stress that she attended funeral for her friend today who had a sudden V. fib arrest and died unexpectedly after 2 weeks of chest pain that was never evaluated.  She is afebrile and nontoxic.  No recent URI type symptoms.  Negative work-up in ED including labs, trop x2, EKG, and CXR.  No tachycardia, hypoxia, or pleuritic component to pain concerning for PE.  Feel she is stable for discharge-- she is agreeable and feels comfortable with negative work-up.  She will follow-up with PCP.  Return here for any new/acute changes.  Final Clinical Impression(s) / ED Diagnoses Final diagnoses:  Chest pain in adult    Rx / DC Orders ED Discharge Orders    None       Garlon Hatchet, PA-C 08/02/20 0149    Nira Conn, MD 08/04/20 1406

## 2020-08-03 NOTE — Telephone Encounter (Signed)
Agree with UC eval

## 2020-08-12 ENCOUNTER — Other Ambulatory Visit: Payer: Self-pay

## 2020-08-12 ENCOUNTER — Encounter: Payer: Self-pay | Admitting: Primary Care

## 2020-08-12 ENCOUNTER — Other Ambulatory Visit: Payer: Self-pay | Admitting: Primary Care

## 2020-08-12 ENCOUNTER — Ambulatory Visit: Payer: 59 | Admitting: Primary Care

## 2020-08-12 VITALS — BP 117/74 | HR 74 | Temp 97.5°F | Ht 62.5 in | Wt 177.0 lb

## 2020-08-12 DIAGNOSIS — Z800 Family history of malignant neoplasm of digestive organs: Secondary | ICD-10-CM

## 2020-08-12 DIAGNOSIS — J4520 Mild intermittent asthma, uncomplicated: Secondary | ICD-10-CM

## 2020-08-12 DIAGNOSIS — R0789 Other chest pain: Secondary | ICD-10-CM | POA: Diagnosis not present

## 2020-08-12 DIAGNOSIS — E785 Hyperlipidemia, unspecified: Secondary | ICD-10-CM | POA: Diagnosis not present

## 2020-08-12 DIAGNOSIS — E119 Type 2 diabetes mellitus without complications: Secondary | ICD-10-CM

## 2020-08-12 DIAGNOSIS — K582 Mixed irritable bowel syndrome: Secondary | ICD-10-CM

## 2020-08-12 DIAGNOSIS — E559 Vitamin D deficiency, unspecified: Secondary | ICD-10-CM

## 2020-08-12 DIAGNOSIS — A6000 Herpesviral infection of urogenital system, unspecified: Secondary | ICD-10-CM

## 2020-08-12 LAB — COMPREHENSIVE METABOLIC PANEL
ALT: 12 U/L (ref 0–35)
AST: 15 U/L (ref 0–37)
Albumin: 4.5 g/dL (ref 3.5–5.2)
Alkaline Phosphatase: 49 U/L (ref 39–117)
BUN: 14 mg/dL (ref 6–23)
CO2: 27 mEq/L (ref 19–32)
Calcium: 9.6 mg/dL (ref 8.4–10.5)
Chloride: 102 mEq/L (ref 96–112)
Creatinine, Ser: 0.72 mg/dL (ref 0.40–1.20)
GFR: 109.92 mL/min (ref >60.00)
Glucose, Bld: 164 mg/dL — ABNORMAL HIGH (ref 70–99)
Potassium: 4.3 mEq/L (ref 3.5–5.1)
Sodium: 134 mEq/L — ABNORMAL LOW (ref 135–145)
Total Bilirubin: 0.3 mg/dL (ref 0.2–1.2)
Total Protein: 7.7 g/dL (ref 6.0–8.3)

## 2020-08-12 LAB — POCT GLYCOSYLATED HEMOGLOBIN (HGB A1C): Hemoglobin A1C: 6.9 % — AB (ref 4.0–5.6)

## 2020-08-12 LAB — VITAMIN D 25 HYDROXY (VIT D DEFICIENCY, FRACTURES): VITD: 11.87 ng/mL — ABNORMAL LOW (ref 30.00–100.00)

## 2020-08-12 LAB — LIPID PANEL
Cholesterol: 230 mg/dL — ABNORMAL HIGH (ref 0–200)
HDL: 36.8 mg/dL — ABNORMAL LOW (ref >39.00)
LDL Cholesterol: 170 mg/dL — ABNORMAL HIGH (ref 0–99)
NonHDL: 192.9
Total CHOL/HDL Ratio: 6
Triglycerides: 117 mg/dL (ref 0.0–149.0)
VLDL: 23.4 mg/dL (ref 0.0–40.0)

## 2020-08-12 LAB — LIPASE: Lipase: 22 U/L (ref 11.0–59.0)

## 2020-08-12 MED ORDER — VITAMIN D (ERGOCALCIFEROL) 1.25 MG (50000 UNIT) PO CAPS: ORAL_CAPSULE | ORAL | 0 refills | Status: DC

## 2020-08-12 NOTE — Assessment & Plan Note (Signed)
Chronic, intermittent flares. Offered to change metformin ER to another regimen, she kindly declines as she endorses GI upset has been chronic and is no worse with Metformin.  Follows with GI.

## 2020-08-12 NOTE — Assessment & Plan Note (Signed)
Controlled today with A1C of 6.9, would like to see her below 6.5 which could be obtained if she were compliant daily to her Metformin ER 500 mg. She will resume daily.  Foot exam today. Recommended statin therapy given LDL level and FH of CAD. She declines. Recommended pneumonia vaccination, she declines.  Will obtain records for eye exam.  Follow up in 6 months.

## 2020-08-12 NOTE — Progress Notes (Signed)
Subjective:    Patient ID: Brenda Valdez, female    DOB: 05-Nov-1985, 35 y.o.   MRN: [id number]  HPI  This visit occurred during the SARS-CoV-2 public health emergency.  Safety protocols were in place, including screening questions prior to the visit, additional usage of staff PPE, and extensive cleaning of exam room while observing appropriate contact time as indicated for disinfecting solutions.   Brenda Valdez is a 35 year old female with a history of asthma, IBS, type 2 diabetes, chiari I malformation, ADHD, chest pain who presents today for follow up.  1) Asthma: Currently managed on abortive therapy, albuterol inhaler, for which she uses as needed.  No recent use of albuterol. Denies concerns today.  2) Type 2 Diabetes:   Current medications include: Metformin XR 500 mg. She has been intermittently taking Metformin, she's been taking it consistently for about one week.   She is checking his/her blood glucose inconsistently.   Last A1C: 7.0 in July 2021, 6.9 today Last Eye Exam: Completed in 2021 Last Foot Exam: Due today Pneumonia Vaccination: Never completed ACE/ARB: None. Urine microalbumin UTD. Statin: None  3) Chest pain/Chest discomfort: Chronic, intermittent with several office visits and ED visits for same. Recent ED visit occurred on 08/02/20, presented to [facility], had been under increased stress, just attended a funeral of a friend who had sudden v-fib arrest. She works as a Radiation protection practitioner.   Work up in the ED was grossly negative including negative troponin, chest xray, ECG. Low suspicion for PE. She was discharged home later that day morning.  Today she denies chest pain. She's never seen cardiology but would like an appointment. Family history of CAD on fathers side. Her uncle and cousin have early death from CAD. Her last LDL was in the 160's, prior testing shows LDL in the 170's and 180's. We've recommended statin therapy in the past for which she's declined, even despite her  history of diabetes, symptoms of chest pain, and family history of CAD.  BP Readings from Last 3 Encounters:  08/12/20 117/74  08/02/20 120/76  02/29/20 112/72     4) ADHD: Currently following with psychiatry, managed on Mydayis 37.5 mg. No concerns today, feels well managed.   5) IBS: Chronic, follows with GI. Managed on famotidine 20 mg PRN. She doesn't get relief from omeprazole. She is on Metformin XR 500 mg. Long standing history of diarrhea that began before managed on metformin.   Review of Systems  Constitutional: Negative for fever.  Respiratory: Negative for shortness of breath.   Cardiovascular: Positive for chest pain.       See HPI  Gastrointestinal:       Chronic, intermittent GERD and abdominal pain  Psychiatric/Behavioral: The patient is not nervous/anxious.        Past Medical History:  Diagnosis Date  . ADHD   . Asthma    excercise induced  . Chiari I malformation (HCC)   . Decreased fetal movement during pregnancy in third trimester, antepartum 12/05/2014  . Decreased fetal movement in pregnancy in third trimester, antepartum 12/23/2014  . Hyperlipidemia   . IBS (irritable bowel syndrome)   . Internal hemorrhoids    noted on colonoscopy from 2020  . No pertinent past medical history   . Vaginal Pap smear, abnormal      Social History   Socioeconomic History  . Marital status: Single    Spouse name: Not on file  . Number of children: 1  . Years of education: BA  .  Highest education level: Not on file  Occupational History  . 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 Smoking status: Never Smoker  . Smokeless tobacco: Never Used  Vaping Use  . Vaping Use: Never used  Substance and Sexual Activity  . Alcohol use: No    Comment: socially  . Drug use: No  . Sexual activity: Yes    Birth control/protection: None  Other Topics Concern  . Not on file  Social History Narrative   Lives with mother and daughter   Caffeine use: Drinks 1/2 cup  per day   Right handed    Social Determinants of Health   Financial Resource Strain: Not on file  Food Insecurity: Not on file  Transportation Needs: Not on file  Physical Activity: Not on file  Stress: Not on file  Social Connections: Not on file  Intimate Partner Violence: Not on file    Past Surgical History:  Procedure Laterality Date  . CESAREAN SECTION N/A 12/25/2014   Procedure: CESAREAN SECTION;  Surgeon: Shea Evans, MD;  Location: [facility];  Service: Obstetrics;  Laterality: N/A;  . LAPAROSCOPY  03/24/2011   Procedure: LAPAROSCOPY DIAGNOSTIC;  Surgeon: Meriel Pica;  Location: [facility];  Service: Gynecology;  Laterality: N/A;  . 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 LEEP  08/11/2012   Procedure: LOOP ELECTROSURGICAL EXCISION PROCEDURE (LEEP);  Surgeon: Meriel Pica, MD;  Location: [facility];  Service: Gynecology;  Laterality: N/A;  . WISDOM TOOTH EXTRACTION      Family History  Problem Relation Age of Onset  . COPD Mother   . Anxiety disorder Mother   . Dementia Mother     No Known Allergies  Current Outpatient Medications on File Prior to Visit  Medication Sig Dispense Refill  . albuterol (PROVENTIL HFA;VENTOLIN HFA) 108 (90 Base) MCG/ACT inhaler Inhale 2 puffs into the lungs every 6 (six) hours as needed. For wheezing 8.5 Inhaler 1  . etonogestrel (NEXPLANON) 68 MG IMPL implant 68 mg.    . famotidine (PEPCID) 20 MG tablet Take 1 tablet (20 mg total) by mouth 2 (two) times daily. (Patient taking differently: Take 20 mg by mouth daily as needed.) 60 tablet 0  . glucose blood (ONETOUCH VERIO) test strip Use as instructed to check blood sugar up to 3 times a day 100 each 5  . Lancets (ONETOUCH DELICA PLUS LANCET33G) MISC Use as instructed to check blood sugar up to 3 times a day 100 each 5   . metFORMIN (GLUCOPHAGE-XR) 500 MG 24 hr tablet TAKE 1 TABLET (500 MG TOTAL) BY MOUTH DAILY WITH BREAKFAST. FOR DIABETES. 30 tablet 5  . Multiple Vitamin (MULTIVITAMIN) capsule Take by mouth.    . ondansetron (ZOFRAN ODT) 4 MG disintegrating tablet Take 1 tablet (4 mg total) by mouth every 8 (eight) hours as needed for nausea or vomiting. 20 tablet 0  . valACYclovir (VALTREX) 500 MG tablet Take 1 tablet by mouth twice daily for 3 days for outbreaks. 6 tablet 1  . MYDAYIS 37.5 MG CP24 Take 1 capsule by mouth every morning.     No current facility-administered medications on file prior to visit.    BP 117/74   Pulse 74   Temp (!) 97.5 F (36.4 C) (Temporal)   Ht 5' 2.5" (1.588 m)  Wt 177 lb (80.3 kg)   SpO2 99%   BMI 31.86 kg/m    Objective:   Physical Exam Constitutional:      Appearance: She is well-nourished.  Cardiovascular:     Rate and Rhythm: Normal rate and regular rhythm.  Pulmonary:     Effort: Pulmonary effort is normal.     Breath sounds: Normal breath sounds.  Musculoskeletal:     Cervical back: Neck supple.  Skin:    General: Skin is warm and dry.  Psychiatric:        Mood and Affect: Mood and affect and mood normal.            Assessment & Plan:

## 2020-08-12 NOTE — Assessment & Plan Note (Signed)
Denies concerns, infrequent use of albuterol. Continue to monitor.

## 2020-08-12 NOTE — Assessment & Plan Note (Signed)
Infrequent use of valacyclovir.  Continue to monitor.

## 2020-08-12 NOTE — Assessment & Plan Note (Signed)
Repeat Lipase pending.  Discussed importance of managing diabetes with metformin compliance, healthy diet, exercise.

## 2020-08-12 NOTE — Patient Instructions (Addendum)
Stop by the lab prior to leaving today. I will notify you of your results once received.   Please consider trying a statin medication to reduce risk for heart disease and lower LDL. This is important as you have diabetes.  Please consider the pneumonia vaccination.  Please take your metformin everyday.  It is important that you improve your diet. Please limit carbohydrates in the form of white bread, rice, pasta, sweets, fast food, fried food, sugary drinks, etc. Increase your consumption of fresh fruits and vegetables, whole grains, lean protein.  Ensure you are consuming 64 ounces of water daily.  Please schedule a follow up appointment in 6 months.   It was a pleasure to see you today!

## 2020-08-12 NOTE — Assessment & Plan Note (Signed)
Chronic, intermittent, recent episode requiring ED visit.  Work up during recent ED visit negative.  Referral placed to cardiology given ongoing symptoms and office/ED visits.  Strong FH of CAD.  I recommended statin therapy given LDL readings, she declines. Repeat lipid panel pending today.   Referral to cardiology placed.

## 2020-08-12 NOTE — Assessment & Plan Note (Signed)
Above goal during prior checks, has declined statin therapy despite recommendations given diabetes and FH of heart disease, and her ongoing intermittent chest pain.  Again recommended statin therapy, she declines. Repeat lipid panel pending.

## 2020-08-13 ENCOUNTER — Ambulatory Visit: Payer: 59 | Admitting: Internal Medicine

## 2020-09-02 ENCOUNTER — Ambulatory Visit: Payer: 59 | Admitting: Internal Medicine

## 2020-09-02 ENCOUNTER — Encounter: Payer: Self-pay | Admitting: Internal Medicine

## 2020-09-02 ENCOUNTER — Other Ambulatory Visit: Payer: Self-pay

## 2020-09-02 VITALS — BP 90/56 | HR 87 | Ht 62.0 in | Wt 177.8 lb

## 2020-09-02 DIAGNOSIS — R011 Cardiac murmur, unspecified: Secondary | ICD-10-CM

## 2020-09-02 DIAGNOSIS — R079 Chest pain, unspecified: Secondary | ICD-10-CM | POA: Diagnosis not present

## 2020-09-02 DIAGNOSIS — E0969 Drug or chemical induced diabetes mellitus with other specified complication: Secondary | ICD-10-CM | POA: Diagnosis not present

## 2020-09-02 DIAGNOSIS — Z8241 Family history of sudden cardiac death: Secondary | ICD-10-CM

## 2020-09-02 DIAGNOSIS — E785 Hyperlipidemia, unspecified: Secondary | ICD-10-CM

## 2020-09-02 NOTE — Patient Instructions (Signed)
Medication Instructions:  Your physician recommends that you continue on your current medications as directed. Please refer to the Current Medication list given to you today.  *If you need a refill on your cardiac medications before your next appointment, please call your pharmacy*   Lab Work: None If you have labs (blood work) drawn today and your tests are completely normal, you will receive your results only by: Marland Kitchen MyChart Message (if you have MyChart) OR . A paper copy in the mail If you have any lab test that is abnormal or we need to change your treatment, we will call you to review the results.   Testing/Procedures: Your physician has requested that you have an echocardiogram. Echocardiography is a painless test that uses sound waves to create images of your heart. It provides your doctor with information about the size and shape of your heart and how well your heart's chambers and valves are working. This procedure takes approximately one hour. There are no restrictions for this procedure.  Your physician has requested that you have a stress echocardiogram. For further information please visit [email or web address] Please follow instruction sheet as given.   Follow-Up: At [facility], you and your health needs are our priority.  As part of our continuing mission to provide you with exceptional heart care, we have created designated Provider Care Teams.  These Care Teams include your primary Cardiologist (physician) and Advanced Practice Providers (APPs -  Physician Assistants and Nurse Practitioners) who all work together to provide you with the care you need, when you need it.  We recommend signing up for the patient portal called "MyChart".  Sign up information is provided on this After Visit Summary.  MyChart is used to connect with patients for Virtual Visits (Telemedicine).  Patients are able to view lab/test results, encounter notes, upcoming appointments, etc.  Non-urgent  messages can be sent to your provider as well.   To learn more about what you can do with MyChart, go to [email or web address].    Your next appointment:   2-3 month(s)  The format for your next appointment:   In Person  Provider:   You may see Dr. Riley Lam or one of the following Advanced Practice Providers on your designated Care Team:    Ronie Spies, PA-C  Jacolyn Reedy, PA-C    Other Instructions

## 2020-09-02 NOTE — Progress Notes (Signed)
Cardiology Office Note:    Date:  09/02/2020   ID:  Brenda Valdez, DOB 07-20-1986, MRN [id number]  PCP:  [name], NP  [facility] Cardiologist:  No primary care provider on file.  [facility] Electrophysiologist:  None   CC: Frequent chest pain Consulted for the evaluation of chest pain at the behest of [name], NP  History of Present Illness:    Brenda Valdez is a 35 y.o. female with a hx of newly diagnosed T2DM with HLD who presents for evaluation 09/02/20.  Patient notes that she is feeling some chest pain Had ED visit 08/01/20 for chest pain; this was the day her friend died of a VF arrest.  Presented with chest pain that was left sidied and radiated to her shoulder (3/10).  Negative biomarkers- was sent home with resolution of CP.  Has had intermittent chest pain that radiates to her left arm.  She had worsening of with radiation to her shoulder from the 08/01/20 incident.  Over the past month has had duller chest pressure 8-10 times.  Discomfort occurs sponanteously, worsens without food or activity, and improves without intervention.  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  No shortness of breath, DOE.  No PND or orthopnea.  Notes bendopnea, but no weight gain, leg swelling , or abdominal swelling.  No syncope or near syncope . Notes palpitations, but largely resolved on adderal.   Chest pain has been stable and persistent.  Patient reports prior NO cardiac testing including  echo,  stress test,  heart catheterizations,  cardioversion,  ablations.  Notes history of gestational diabetes, no early menarche, notes premature birth.  No drug use.   Past Medical History:  Diagnosis Date  . ADHD   . Asthma    excercise induced  . Chiari I malformation (HCC)   . Decreased fetal movement during pregnancy in third trimester, antepartum 12/05/2014  . Decreased fetal movement in pregnancy in  third trimester, antepartum 12/23/2014  . Hyperlipidemia   . IBS (irritable bowel syndrome)   . Internal hemorrhoids    noted on colonoscopy from 2020  . No pertinent past medical history   . Vaginal Pap smear, abnormal     Past Surgical History:  Procedure Laterality Date  . CESAREAN SECTION N/A 12/25/2014   Procedure: CESAREAN SECTION;  Surgeon: Shea Evans, MD;  Location: [facility];  Service: Obstetrics;  Laterality: N/A;  . LAPAROSCOPY  03/24/2011   Procedure: LAPAROSCOPY DIAGNOSTIC;  Surgeon: Meriel Pica;  Location: [facility];  Service: Gynecology;  Laterality: N/A;  . 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 LEEP  08/11/2012   Procedure: LOOP ELECTROSURGICAL EXCISION PROCEDURE (LEEP);  Surgeon: Meriel Pica, MD;  Location: [facility];  Service: Gynecology;  Laterality: N/A;  . WISDOM TOOTH EXTRACTION      Current Medications: Current Meds  Medication Sig  . albuterol (VENTOLIN HFA) 108 (90 Base) MCG/ACT inhaler Inhale into the lungs every 6 (six) hours as needed for wheezing or shortness of breath.  . dexlansoprazole (DEXILANT) 60 MG capsule Take 60 mg by mouth daily.  Marland Kitchen etonogestrel (NEXPLANON) 68 MG IMPL implant 68 mg by Subdermal route once.  . famotidine (PEPCID) 10 MG tablet Take 10  mg by mouth as needed for heartburn or indigestion.  Marland Kitchen glucose blood (ONETOUCH VERIO) test strip Use as instructed to check blood sugar up to 3 times a day  . Lancets (ONETOUCH DELICA PLUS LANCET33G) MISC Use as instructed to check blood sugar up to 3 times a day  . metFORMIN (GLUCOPHAGE-XR) 500 MG 24 hr tablet TAKE 1 TABLET (500 MG TOTAL) BY MOUTH DAILY WITH BREAKFAST. FOR DIABETES.  . Multiple Vitamin (MULTIVITAMIN) capsule Take by mouth.  Marland Kitchen MYDAYIS 37.5 MG CP24 Take 1 capsule by mouth every morning.  . ondansetron  (ZOFRAN ODT) 4 MG disintegrating tablet Take 1 tablet (4 mg total) by mouth every 8 (eight) hours as needed for nausea or vomiting.  . valACYclovir (VALTREX) 500 MG tablet Take 500 mg by mouth as needed (for outbreaks).  . Vitamin D, Ergocalciferol, (DRISDOL) 1.25 MG (50000 UNIT) CAPS capsule Take 1 capsule by mouth once weekly for 12 weeks.     Allergies:   Patient has no known allergies.   Social History   Socioeconomic History  . Marital status: Single    Spouse name: Not on file  . Number of children: 1  . Years of education: BA  . Highest education level: Not on file  Occupational History  . 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 Smoking status: Never Smoker  . Smokeless tobacco: Never Used  Vaping Use  . Vaping Use: Never used  Substance and Sexual Activity  . Alcohol use: No    Comment: socially  . Drug use: No  . Sexual activity: Yes    Birth control/protection: None  Other Topics Concern  . 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 History of coronary artery disease notable for father, maternal aunt. History of heart failure notable for no members. No history of cardiomyopathies including hypertrophic cardiomyopathy, left ventricular non-compaction, or arrhythmogenic right ventricular cardiomyopathy History of arrhythmia notable for no members. Family history of sudden cardiac death in uncle and cousin (cousin died suddenly on the basketball court) No history of bicuspid aortic valve  or aortic aneurysm or dissection.  ROS:   Please see the history of present illness.    All other systems reviewed and are negative.  EKGs/Labs/Other Studies Reviewed:    The following studies were reviewed today:  EKG:   08/05/2020: SR 96 WNL  Recent Labs: 12/01/2019: TSH 1.78 08/01/2020: Hemoglobin 12.3; Platelets 296 08/12/2020: ALT 12; BUN 14; Creatinine, Ser 0.72; Potassium 4.3; Sodium 134  Recent Lipid Panel    Component Value Date/Time   CHOL 230 (H) 08/12/2020 0834   TRIG 117.0 08/12/2020 0834   HDL 36.80 (L) 08/12/2020 0834   CHOLHDL 6 08/12/2020 0834   VLDL 23.4 08/12/2020 0834   LDLCALC 170 (H) 08/12/2020 0834   Risk Assessment/Calculations:    N/A  Physical Exam:    VS:  BP (!) 90/56   Pulse 87   Ht 5\' 2"  (1.575 m)   Wt  177 lb 12.8 oz (80.6 kg)   SpO2 97%   BMI 32.52 kg/m     Wt Readings from Last 3 Encounters:  09/02/20 177 lb 12.8 oz (80.6 kg)  08/12/20 177 lb (80.3 kg)  02/29/20 177 lb (80.3 kg)    GEN:  Well nourished, well developed in no acute distress HEENT: Normal NECK: No JVD; No carotid bruits LYMPHATICS: No lymphadenopathy CARDIAC: RRR, Dynamic systolic murmur with standing up and with hand grip; no change with valsalva  murmurs, rubs, gallops RESPIRATORY:  Clear to auscultation without rales, wheezing or rhonchi  ABDOMEN: Soft, non-tender, non-distended MUSCULOSKELETAL:  No edema; No deformity  SKIN: Warm and dry NEUROLOGIC:  Alert and oriented x 3 PSYCHIATRIC:  Normal affect   ASSESSMENT:    1. Chest pain of uncertain etiology   2. Family history of sudden cardiac death   3. Drug-induced diabetes mellitus with hyperlipidemia (HCC)   4. Heart murmur    PLAN:    Chest Pain Syndrome Family history of SCD in cousin - The patient presents with atypical chest pain - EKG shows without evidence of accessory pathway, ventricular pacing, digoxin use, LBBB, or baseline ST changes. - Would recommend exercise stress echo (NPO at  midnight); discussed risks, benefits, and alternatives of the diagnostic procedure including chest pain, arrhythmia, and death.  Patient amenable for testing. - would need resting echo for HCM eval given family history of SCD  DM with HLD -LDL goal less than 100 - gave education on dietary changes - offered CAC scoring and aggressive cholesterol management -  Three months follow up unless new symptoms or abnormal test results warranting change in plan  Would be reasonable for  Video Visit Follow up Would be reasonable for  APP Follow up   Shared Decision Making/Informed Consent The risks [chest pain, shortness of breath, cardiac arrhythmias, dizziness, blood pressure fluctuations, myocardial infarction, stroke/transient ischemic attack, and life-threatening complications (estimated to be 1 in 10,000)], benefits (risk stratification, diagnosing coronary artery disease, treatment guidance) and alternatives of a stress or dobutamine stress echocardiogram were discussed in detail with Ms. Clausing and she agrees to proceed.      Medication Adjustments/Labs and Tests Ordered: Current medicines are reviewed at length with the patient today.  Concerns regarding medicines are outlined above.  Orders Placed This Encounter  Procedures  . Cardiac Stress Test: Informed Consent Details: Physician/Practitioner Attestation; Transcribe to consent form and obtain patient signature  . ECHOCARDIOGRAM COMPLETE  . ECHOCARDIOGRAM STRESS TEST   No orders of the defined types were placed in this encounter.   Patient Instructions  Medication Instructions:  Your physician recommends that you continue on your current medications as directed. Please refer to the Current Medication list given to you today.  *If you need a refill on your cardiac medications before your next appointment, please call your pharmacy*   Lab Work: None If you have labs (blood work) drawn today and your tests are completely normal,  you will receive your results only by: Marland Kitchen MyChart Message (if you have MyChart) OR . A paper copy in the mail If you have any lab test that is abnormal or we need to change your treatment, we will call you to review the results.   Testing/Procedures: Your physician has requested that you have an echocardiogram. Echocardiography is a painless test that uses sound waves to create images of your heart. It provides your doctor with information about the size and shape of your heart and how well your  heart's chambers and valves are working. This procedure takes approximately one hour. There are no restrictions for this procedure.  Your physician has requested that you have a stress echocardiogram. For further information please visit [email or web address] Please follow instruction sheet as given.   Follow-Up: At [facility], you and your health needs are our priority.  As part of our continuing mission to provide you with exceptional heart care, we have created designated Provider Care Teams.  These Care Teams include your primary Cardiologist (physician) and Advanced Practice Providers (APPs -  Physician Assistants and Nurse Practitioners) who all work together to provide you with the care you need, when you need it.  We recommend signing up for the patient portal called "MyChart".  Sign up information is provided on this After Visit Summary.  MyChart is used to connect with patients for Virtual Visits (Telemedicine).  Patients are able to view lab/test results, encounter notes, upcoming appointments, etc.  Non-urgent messages can be sent to your provider as well.   To learn more about what you can do with MyChart, go to [email or web address].    Your next appointment:   2-3 month(s)  The format for your next appointment:   In Person  Provider:   You may see Dr. Riley Lam or one of the following Advanced Practice Providers on your designated Care Team:    Ronie Spies,  PA-C  Jacolyn Reedy, PA-C    Other Instructions      Signed, Christell Constant, MD  09/02/2020 2:35 PM    [facility]

## 2020-09-21 ENCOUNTER — Other Ambulatory Visit (HOSPITAL_COMMUNITY): Payer: 59

## 2020-09-25 ENCOUNTER — Other Ambulatory Visit (HOSPITAL_COMMUNITY): Payer: 59

## 2020-10-11 LAB — HM PAP SMEAR: HPV, high-risk: POSITIVE

## 2020-10-17 ENCOUNTER — Telehealth (HOSPITAL_COMMUNITY): Payer: Self-pay | Admitting: *Deleted

## 2020-10-17 NOTE — Telephone Encounter (Signed)
Left message on voicemail per DPR in reference to upcoming appointment scheduled on 10/21/20 at 2:00 with detailed instructions given per Myocardial Perfusion Study Information Sheet for the test. LM to arrive 40 minutes early, and that it is imperative to arrive on time for appointment to keep from having the test rescheduled. If you need to cancel or reschedule your appointment, please call the office within 24 hours of your appointment. Failure to do so may result in a cancellation of your appointment, and a $50 no show fee. Phone number given for call back for any questions.

## 2020-10-18 ENCOUNTER — Other Ambulatory Visit (HOSPITAL_COMMUNITY)
Admission: RE | Admit: 2020-10-18 | Discharge: 2020-10-18 | Disposition: A | Discharge status: Home / Self Care | Payer: 59 | Source: Ambulatory Visit | Attending: Internal Medicine | Admitting: Internal Medicine

## 2020-10-18 DIAGNOSIS — Z01812 Encounter for preprocedural laboratory examination: Secondary | ICD-10-CM | POA: Insufficient documentation

## 2020-10-18 DIAGNOSIS — Z20822 Contact with and (suspected) exposure to covid-19: Secondary | ICD-10-CM | POA: Insufficient documentation

## 2020-10-18 LAB — SARS CORONAVIRUS 2 (TAT 6-24 HRS): SARS Coronavirus 2: NEGATIVE

## 2020-10-21 ENCOUNTER — Ambulatory Visit (HOSPITAL_BASED_OUTPATIENT_CLINIC_OR_DEPARTMENT_OTHER): Payer: 59

## 2020-10-21 ENCOUNTER — Ambulatory Visit (HOSPITAL_COMMUNITY): Payer: 59

## 2020-10-21 ENCOUNTER — Ambulatory Visit (HOSPITAL_COMMUNITY): Payer: 59 | Attending: Cardiovascular Disease

## 2020-10-21 ENCOUNTER — Other Ambulatory Visit: Payer: Self-pay

## 2020-10-21 DIAGNOSIS — Z8241 Family history of sudden cardiac death: Secondary | ICD-10-CM | POA: Insufficient documentation

## 2020-10-21 DIAGNOSIS — R011 Cardiac murmur, unspecified: Secondary | ICD-10-CM | POA: Diagnosis present

## 2020-10-21 DIAGNOSIS — R079 Chest pain, unspecified: Secondary | ICD-10-CM

## 2020-10-21 LAB — ECHOCARDIOGRAM COMPLETE
Area-P 1/2: 4.01 cm2
S' Lateral: 2.9 cm

## 2020-10-21 MED ORDER — PERFLUTREN LIPID MICROSPHERE
1.0000 mL | INTRAVENOUS | Status: AC | PRN
  Administered 2020-10-21 (×2): 3 mL via INTRAVENOUS

## 2020-11-05 ENCOUNTER — Other Ambulatory Visit: Payer: Self-pay

## 2020-12-18 NOTE — Progress Notes (Signed)
Cardiology Office Note:    Date:  12/19/2020   ID:  Brenda Valdez, DOB 1985-12-26, MRN [id number]  PCP:  Brenda Nest, NP  [facility] Cardiologist:  Brenda Constant, MD  [facility] Electrophysiologist:  None   CC: Frequent chest pain follow up  History of Present Illness:    Brenda Valdez is a 35 y.o. female with a hx of  T2DM with HLD who presents for evaluation 09/02/20. In interim of this visit, patient negative echo and stress echo.  Patient notes that she is doing good.  Since last visit notes no changes in chest pain, but not associated symptoms.  Also feels it in the back; 2-3/10.    No SOB/DOE and no PND/Orthopnea.  No weight gain or leg swelling, except with certain salty seafood.  No palpitations or syncope.   Past Medical History:  Diagnosis Date  . ADHD   . Asthma    excercise induced  . Chiari I malformation (HCC)   . Decreased fetal movement during pregnancy in third trimester, antepartum 12/05/2014  . Decreased fetal movement in pregnancy in third trimester, antepartum 12/23/2014  . Hyperlipidemia   . IBS (irritable bowel syndrome)   . Internal hemorrhoids    noted on colonoscopy from 2020  . No pertinent past medical history   . Vaginal Pap smear, abnormal     Past Surgical History:  Procedure Laterality Date  . CESAREAN SECTION N/A 12/25/2014   Procedure: CESAREAN SECTION;  Surgeon: Brenda Evans, MD;  Location: [facility];  Service: Obstetrics;  Laterality: N/A;  . LAPAROSCOPY  03/24/2011   Procedure: LAPAROSCOPY DIAGNOSTIC;  Surgeon: Brenda Valdez;  Location: [facility];  Service: Gynecology;  Laterality: N/A;  . LASER ABLATION CONDOLAMATA N/A 06/16/2013   Procedure:  CO2 LASER ABLATION CONDOLAMATA;  Surgeon: Brenda Pica, MD;  Location: [facility];  Service: Gynecology;  Laterality: N/A;  CO2 LASER ABLATION OF VAIN & CIN  . LEEP  08/11/2012   Procedure: LOOP ELECTROSURGICAL EXCISION PROCEDURE (LEEP);  Surgeon: Brenda Pica, MD;  Location:  [facility];  Service: Gynecology;  Laterality: N/A;  . WISDOM TOOTH EXTRACTION      Current Medications: Current Meds  Medication Sig  . albuterol (VENTOLIN HFA) 108 (90 Base) MCG/ACT inhaler Inhale into the lungs every 6 (six) hours as needed for wheezing or shortness of breath.  . dicyclomine (BENTYL) 20 MG tablet Take 20 mg by mouth every 6 (six) hours.  Brenda Valdez etonogestrel (NEXPLANON) 68 MG IMPL implant 68 mg by Subdermal route once.  . famotidine (PEPCID) 10 MG tablet Take 10 mg by mouth as needed for heartburn or indigestion.  Brenda Valdez glucose blood (ONETOUCH VERIO) test strip Use as instructed to check blood sugar up to 3 times a day  . Lancets (ONETOUCH DELICA PLUS LANCET33G) MISC Use as instructed to check blood sugar up to 3 times a day  . metFORMIN (GLUCOPHAGE-XR) 500 MG 24 hr tablet TAKE 1 TABLET (500 MG TOTAL) BY MOUTH DAILY WITH BREAKFAST. FOR DIABETES.  . Multiple Vitamin (MULTIVITAMIN) capsule Take by mouth.  Brenda Valdez MYDAYIS 37.5 MG CP24 Take 1 capsule by mouth every morning.  . ondansetron (ZOFRAN ODT) 4 MG disintegrating tablet Take 1 tablet (4 mg total) by mouth every 8 (eight) hours as needed for nausea or vomiting.  . rosuvastatin (CRESTOR) 5 MG tablet Take 1 tablet (5 mg total) by mouth daily.  . valACYclovir (VALTREX) 500 MG tablet Take 500 mg by mouth as needed (for outbreaks).  Brenda Valdez  Vitamin D, Ergocalciferol, (DRISDOL) 1.25 MG (50000 UNIT) CAPS capsule Take 1 capsule by mouth once weekly for 12 weeks.  . [DISCONTINUED] dexlansoprazole (DEXILANT) 60 MG capsule Take 60 mg by mouth daily.     Allergies:   Patient has no known allergies.   Social History   Socioeconomic History  . Marital status: Single    Spouse name: Not on file  . Number of children: 1  . Years of education: BA  . Highest education level: Not on file  Occupational History  . 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 Smoking status: Never Smoker  . Smokeless tobacco: Never Used  Vaping Use  . Vaping Use:  Never used  Substance and Sexual Activity  . Alcohol use: No    Comment: socially  . Drug use: No  . Sexual activity: Yes    Birth control/protection: None  Other Topics Concern  . 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  Family History: The patient's family history includes Anxiety disorder in her mother; COPD in her mother; Dementia in her mother; Diabetes in her father; Hypertension in her father; Stroke in her mother. History of coronary artery disease notable for father, maternal aunt. History of heart failure notable for no members. No history of cardiomyopathies including hypertrophic cardiomyopathy, left ventricular non-compaction, or arrhythmogenic right ventricular cardiomyopathy History of arrhythmia notable for no members. Family history of sudden cardiac death in uncle and cousin (cousin died suddenly on the basketball court) No history of bicuspid aortic valve or aortic aneurysm or dissection.  ROS:   Please see the history of present illness.    All other systems reviewed and are negative.  EKGs/Labs/Other Studies Reviewed:    The following studies were reviewed today:  EKG:   08/05/2020: SR 96 WNL  Transthoracic Echocardiogram: Date: 10/21/20 Results: 1. Left ventricular ejection fraction, by estimation, is 55 to 60%. Left  ventricular ejection fraction by 3D volume is 65 %. The left ventricle has  normal function. The left ventricle has no regional wall motion  abnormalities. Left ventricular diastolic  parameters were normal.  2. Right ventricular systolic function is normal.  The right ventricular  size is normal.  3. The mitral valve is normal in structure. Trivial mitral valve  regurgitation. No evidence of mitral stenosis.  4. The aortic valve is normal in structure. Aortic valve regurgitation is  not visualized. No aortic stenosis is present.  5. The inferior vena cava is normal in size with greater than 50%  respiratory variability, suggesting right atrial pressure of 3 mmHg.   Stress Echo: Date: 10/21/20 Results: 2D Echo Findings: Baseline regional wall motion abnormalities were not  present. There were no stress-induced wall motion abnormalities. This is a  negative stress echocardiogram for ischemia.   1. This is a negative stress echocardiogram for ischemia.  2. This is a low risk study.   Recent Labs: 08/01/2020: Hemoglobin 12.3; Platelets 296 08/12/2020: ALT 12; BUN 14; Creatinine, Ser 0.72; Potassium 4.3; Sodium 134  Recent  Lipid Panel    Component Value Date/Time   CHOL 230 (H) 08/12/2020 0834   TRIG 117.0 08/12/2020 0834   HDL 36.80 (L) 08/12/2020 0834   CHOLHDL 6 08/12/2020 0834   VLDL 23.4 08/12/2020 0834   LDLCALC 170 (H) 08/12/2020 0834   Risk Assessment/Calculations:    N/A  Physical Exam:    VS:  BP 100/60   Pulse 94   Ht 5\' 2"  (1.575 m)   Wt 79.8 kg   SpO2 98%   BMI 32.19 kg/m     Wt Readings from Last 3 Encounters:  12/19/20 79.8 kg  09/02/20 80.6 kg  08/12/20 80.3 kg    GEN:  Well nourished, well developed in no acute distress HEENT: Normal NECK: No JVD; No carotid bruits LYMPHATICS: No lymphadenopathy CARDIAC: RRR, II /IV systolic ejection murmur no rubs or gallops RESPIRATORY:  Clear to auscultation without rales, wheezing or rhonchi  ABDOMEN: Soft, non-tender, non-distended MUSCULOSKELETAL:  No edema; No deformity  SKIN: Warm and dry NEUROLOGIC:  Alert and oriented x 3 PSYCHIATRIC:  Normal affect   ASSESSMENT:    1. Hypercholesteremia   2. Hyperlipidemia associated with type 2 diabetes  mellitus (HCC)   3. Family history of sudden cardiac death    PLAN:    HLD with DM Family history of SCD in cousin - Consider echo in 2027 for follow up given SCD NOS in family (2nd degree member) - LDL goal less than 100 - gave education on dietary changes; eats out a lot and is working on making some changes; discussed vegan and pescatarian diets - discussed medication management (notes statin concerns) - SHARED DECISION MAKING:  Patient is going to attempt some dietary changes while we start rosuvastatin 5 mg PO Daily; we will recheck lipids and LFTs in 3 months; when she feels she has optimized her diet and exercise plan we will do a trial off medication and see if she meets her goal; deferred Lp(a)  One year follow up unless new symptoms or abnormal test results warranting change in plan  Would be reasonable for  APP Follow up  Time Spent Directly with Patient:   I have spent a total of 30 minutes with the patient reviewing notes, imaging, EKGs, labs and examining the patient as well as establishing an assessment and plan that was discussed personally with the patient.  > 50% of time was spent in direct patient care.  Medication Adjustments/Labs and Tests Ordered: Current medicines are reviewed at length with the patient today.  Concerns regarding medicines are outlined above.  Orders Placed This Encounter  Procedures  . Hepatic function panel  . Lipid panel   Meds ordered this encounter  Medications  . rosuvastatin (CRESTOR) 5 MG tablet    Sig: Take 1 tablet (5 mg total) by mouth daily.    Dispense:  90 tablet    Refill:  3    Patient Instructions  Medication Instructions:  Your physician has recommended you make the following change in your medication:  START: rosuvastatin (Crestor) 5 mg by mouth daily   *If you need a refill on your cardiac medications before your next appointment, please call your pharmacy*   Lab Work: IN 3 MONTHS: Fasting lipid panel and liver  function test If you have labs (blood work) drawn today and your tests are completely normal, you will receive your results only by: Brenda Valdez. MyChart Message (if you have MyChart) OR . A paper copy in the mail If you have any  lab test that is abnormal or we need to change your treatment, we will call you to review the results.   Testing/Procedures: NONE   Follow-Up: At [facility], you and your health needs are our priority.  As part of our continuing mission to provide you with exceptional heart care, we have created designated Provider Care Teams.  These Care Teams include your primary Cardiologist (physician) and Advanced Practice Providers (APPs -  Physician Assistants and Nurse Practitioners) who all work together to provide you with the care you need, when you need it.  We recommend signing up for the patient portal called "MyChart".  Sign up information is provided on this After Visit Summary.  MyChart is used to connect with patients for Virtual Visits (Telemedicine).  Patients are able to view lab/test results, encounter notes, upcoming appointments, etc.  Non-urgent messages can be sent to your provider as well.   To learn more about what you can do with MyChart, go to [email or web address].    Your next appointment:   12 month(s)  The format for your next appointment:   In Person  Provider:   You may see Brenda Constant, MD or one of the following Advanced Practice Providers on your designated Care Team:    Ronie Spies, PA-C  Jacolyn Reedy, PA-C         Signed, Brenda Constant, MD  12/19/2020 10:01 AM    [facility]

## 2020-12-19 ENCOUNTER — Other Ambulatory Visit: Payer: Self-pay

## 2020-12-19 ENCOUNTER — Encounter: Payer: Self-pay | Admitting: Internal Medicine

## 2020-12-19 ENCOUNTER — Ambulatory Visit: Payer: 59 | Admitting: Internal Medicine

## 2020-12-19 VITALS — BP 100/60 | HR 94 | Ht 62.0 in | Wt 176.0 lb

## 2020-12-19 DIAGNOSIS — E7800 Pure hypercholesterolemia, unspecified: Secondary | ICD-10-CM

## 2020-12-19 DIAGNOSIS — E785 Hyperlipidemia, unspecified: Secondary | ICD-10-CM | POA: Diagnosis not present

## 2020-12-19 DIAGNOSIS — E1169 Type 2 diabetes mellitus with other specified complication: Secondary | ICD-10-CM | POA: Diagnosis not present

## 2020-12-19 DIAGNOSIS — Z8241 Family history of sudden cardiac death: Secondary | ICD-10-CM

## 2020-12-19 MED ORDER — ROSUVASTATIN CALCIUM 5 MG PO TABS: 5.0000 mg | ORAL_TABLET | Freq: Every day | ORAL | 3 refills | Status: DC

## 2020-12-19 NOTE — Patient Instructions (Addendum)
Medication Instructions:  Your physician has recommended you make the following change in your medication:  START: rosuvastatin (Crestor) 5 mg by mouth daily   *If you need a refill on your cardiac medications before your next appointment, please call your pharmacy*   Lab Work: IN 3 MONTHS: Fasting lipid panel and liver function test If you have labs (blood work) drawn today and your tests are completely normal, you will receive your results only by: Marland Kitchen MyChart Message (if you have MyChart) OR . A paper copy in the mail If you have any lab test that is abnormal or we need to change your treatment, we will call you to review the results.   Testing/Procedures: NONE   Follow-Up: At [facility], you and your health needs are our priority.  As part of our continuing mission to provide you with exceptional heart care, we have created designated Provider Care Teams.  These Care Teams include your primary Cardiologist (physician) and Advanced Practice Providers (APPs -  Physician Assistants and Nurse Practitioners) who all work together to provide you with the care you need, when you need it.  We recommend signing up for the patient portal called "MyChart".  Sign up information is provided on this After Visit Summary.  MyChart is used to connect with patients for Virtual Visits (Telemedicine).  Patients are able to view lab/test results, encounter notes, upcoming appointments, etc.  Non-urgent messages can be sent to your provider as well.   To learn more about what you can do with MyChart, go to [email or web address].    Your next appointment:   12 month(s)  The format for your next appointment:   In Person  Provider:   You may see Christell Constant, MD or one of the following Advanced Practice Providers on your designated Care Team:    Ronie Spies, PA-C  Jacolyn Reedy, PA-C

## 2021-02-09 ENCOUNTER — Emergency Department (HOSPITAL_COMMUNITY)
Admission: EM | Admit: 2021-02-09 | Discharge: 2021-02-09 | Disposition: A | Discharge status: Home / Self Care | Payer: 59 | Attending: Emergency Medicine | Admitting: Emergency Medicine

## 2021-02-09 ENCOUNTER — Encounter (HOSPITAL_COMMUNITY): Payer: Self-pay | Admitting: Emergency Medicine

## 2021-02-09 ENCOUNTER — Other Ambulatory Visit: Payer: Self-pay

## 2021-02-09 ENCOUNTER — Emergency Department (HOSPITAL_COMMUNITY): Payer: 59

## 2021-02-09 DIAGNOSIS — E860 Dehydration: Secondary | ICD-10-CM | POA: Insufficient documentation

## 2021-02-09 DIAGNOSIS — E119 Type 2 diabetes mellitus without complications: Secondary | ICD-10-CM | POA: Diagnosis not present

## 2021-02-09 DIAGNOSIS — J4520 Mild intermittent asthma, uncomplicated: Secondary | ICD-10-CM | POA: Insufficient documentation

## 2021-02-09 DIAGNOSIS — D72829 Elevated white blood cell count, unspecified: Secondary | ICD-10-CM | POA: Diagnosis not present

## 2021-02-09 DIAGNOSIS — Z7984 Long term (current) use of oral hypoglycemic drugs: Secondary | ICD-10-CM | POA: Diagnosis not present

## 2021-02-09 DIAGNOSIS — R1013 Epigastric pain: Secondary | ICD-10-CM | POA: Diagnosis not present

## 2021-02-09 DIAGNOSIS — R112 Nausea with vomiting, unspecified: Secondary | ICD-10-CM

## 2021-02-09 HISTORY — DX: Type 2 diabetes mellitus without complications: E11.9

## 2021-02-09 LAB — I-STAT BETA HCG BLOOD, ED (MC, WL, AP ONLY): I-stat hCG, quantitative: <5 m[IU]/mL (ref <5)

## 2021-02-09 LAB — COMPREHENSIVE METABOLIC PANEL
ALT: 15 U/L (ref 0–44)
AST: 23 U/L (ref 15–41)
Albumin: 4.7 g/dL (ref 3.5–5.0)
Alkaline Phosphatase: 62 U/L (ref 38–126)
Anion gap: 9 (ref 5–15)
BUN: 15 mg/dL (ref 6–20)
CO2: 23 mmol/L (ref 22–32)
Calcium: 9.8 mg/dL (ref 8.9–10.3)
Chloride: 106 mmol/L (ref 98–111)
Creatinine, Ser: 0.79 mg/dL (ref 0.44–1.00)
GFR, Estimated: >60 mL/min (ref >60)
Glucose, Bld: 222 mg/dL — ABNORMAL HIGH (ref 70–99)
Potassium: 4 mmol/L (ref 3.5–5.1)
Sodium: 138 mmol/L (ref 135–145)
Total Bilirubin: 0.5 mg/dL (ref 0.3–1.2)
Total Protein: 8.8 g/dL — ABNORMAL HIGH (ref 6.5–8.1)

## 2021-02-09 LAB — CBC WITH DIFFERENTIAL/PLATELET
Abs Immature Granulocytes: 0.07 10*3/uL (ref 0.00–0.07)
Basophils Absolute: 0 10*3/uL (ref 0.0–0.1)
Basophils Relative: 0 %
Eosinophils Absolute: 0.1 10*3/uL (ref 0.0–0.5)
Eosinophils Relative: 1 %
HCT: 41.9 % (ref 36.0–46.0)
Hemoglobin: 13.2 g/dL (ref 12.0–15.0)
Immature Granulocytes: 0 %
Lymphocytes Relative: 12 %
Lymphs Abs: 1.9 10*3/uL (ref 0.7–4.0)
MCH: 27.1 pg (ref 26.0–34.0)
MCHC: 31.5 g/dL (ref 30.0–36.0)
MCV: 86 fL (ref 80.0–100.0)
Monocytes Absolute: 1 10*3/uL (ref 0.1–1.0)
Monocytes Relative: 6 %
Neutro Abs: 13.4 10*3/uL — ABNORMAL HIGH (ref 1.7–7.7)
Neutrophils Relative %: 81 %
Platelets: 315 10*3/uL (ref 150–400)
RBC: 4.87 MIL/uL (ref 3.87–5.11)
RDW: 12.3 % (ref 11.5–15.5)
WBC: 16.5 10*3/uL — ABNORMAL HIGH (ref 4.0–10.5)
nRBC: 0 % (ref 0.0–0.2)

## 2021-02-09 LAB — LIPASE, BLOOD: Lipase: 31 U/L (ref 11–51)

## 2021-02-09 MED ORDER — ONDANSETRON HCL 4 MG/2ML IJ SOLN
4.0000 mg | Freq: Once | INTRAMUSCULAR | Status: AC
  Administered 2021-02-09: 4 mg via INTRAVENOUS
  Filled 2021-02-09: qty 2

## 2021-02-09 MED ORDER — PANTOPRAZOLE SODIUM 40 MG IV SOLR
40.0000 mg | Freq: Once | INTRAVENOUS | Status: AC
  Administered 2021-02-09: 40 mg via INTRAVENOUS
  Filled 2021-02-09: qty 40

## 2021-02-09 MED ORDER — HYDROMORPHONE HCL 1 MG/ML IJ SOLN
1.0000 mg | Freq: Once | INTRAMUSCULAR | Status: AC
  Administered 2021-02-09: 1 mg via INTRAVENOUS
  Filled 2021-02-09: qty 1

## 2021-02-09 MED ORDER — PANTOPRAZOLE SODIUM 20 MG PO TBEC: 20.0000 mg | DELAYED_RELEASE_TABLET | Freq: Every day | ORAL | 0 refills | Status: AC

## 2021-02-09 MED ORDER — SODIUM CHLORIDE 0.9 % IV BOLUS
1000.0000 mL | Freq: Once | INTRAVENOUS | Status: AC
  Administered 2021-02-09: 1000 mL via INTRAVENOUS

## 2021-02-09 MED ORDER — LORAZEPAM 2 MG/ML IJ SOLN
0.5000 mg | Freq: Once | INTRAMUSCULAR | Status: AC
  Administered 2021-02-09: 0.5 mg via INTRAVENOUS
  Filled 2021-02-09: qty 1

## 2021-02-09 MED ORDER — IOHEXOL 300 MG/ML  SOLN
60.0000 mL | Freq: Once | INTRAMUSCULAR | Status: AC | PRN
  Administered 2021-02-09: 60 mL via INTRAVENOUS

## 2021-02-09 MED ORDER — ONDANSETRON 4 MG PO TBDP: ORAL_TABLET | ORAL | 0 refills | Status: AC

## 2021-02-09 NOTE — Discharge Instructions (Signed)
Drink plenty of fluids.  Follow-up with your doctor next week if not proving.

## 2021-02-09 NOTE — ED Notes (Signed)
Pt currently vomiting 

## 2021-02-09 NOTE — ED Provider Notes (Signed)
[facility]-EMERGENCY DEPT Provider Note   CSN: [id number] Arrival date & time: 02/09/21  9485     History Chief Complaint  Patient presents with   Abdominal Pain    Brenda Valdez is a 35 y.o. female.  Patient complains of nausea vomiting.  Patient has a history of irritable bowel  The history is provided by the patient and medical records. No language interpreter was used.  Abdominal Pain Pain location:  Epigastric Pain quality: aching   Pain radiates to:  Does not radiate Pain severity:  Moderate Onset quality:  Sudden Duration: 1 day. Progression:  Waxing and waning Chronicity:  Recurrent Context: not alcohol use   Relieved by:  Nothing Associated symptoms: no chest pain, no cough, no diarrhea, no fatigue and no hematuria       Past Medical History:  Diagnosis Date   ADHD    Asthma    excercise induced   Chiari I malformation (HCC)    Decreased fetal movement during pregnancy in third trimester, antepartum 12/05/2014   Decreased fetal movement in pregnancy in third trimester, antepartum 12/23/2014   Diabetes mellitus without complication (HCC)    Hyperlipidemia    IBS (irritable bowel syndrome)    Internal hemorrhoids    noted on colonoscopy from 2020   No pertinent past medical history    Vaginal Pap smear, abnormal     Patient Active Problem List   Diagnosis Date Noted   Hyperlipidemia associated with type 2 diabetes mellitus (HCC) 12/19/2020   Family history of sudden cardiac death 2020/09/09   Drug-induced diabetes mellitus with hyperlipidemia (HCC) 09-Sep-2020   Family history of pancreatic cancer 02/29/2020   IBS (irritable bowel syndrome) 09/13/2018   Type 2 diabetes mellitus (HCC) 09/13/2018   Preventative health care 09/13/2018   Mild intermittent asthma without complication 09/10/2017   Genital herpes simplex 09/10/2017   Chiari I malformation (HCC) 09/10/2017   Hyperlipidemia 09/10/2017   Chest discomfort 09/10/2017     Past Surgical History:  Procedure Laterality Date   CESAREAN SECTION N/A 12/25/2014   Procedure: CESAREAN SECTION;  Surgeon: Shea Evans, MD;  Location: [facility];  Service: Obstetrics;  Laterality: N/A;   LAPAROSCOPY  03/24/2011   Procedure: LAPAROSCOPY DIAGNOSTIC;  Surgeon: Meriel Pica;  Location: [facility];  Service: Gynecology;  Laterality: N/A;   LASER ABLATION CONDOLAMATA N/A 06/16/2013   Procedure:  CO2 LASER ABLATION CONDOLAMATA;  Surgeon: Meriel Pica, MD;  Location: [facility];  Service: Gynecology;  Laterality: N/A;  CO2 LASER ABLATION OF VAIN & CIN   LEEP  08/11/2012   Procedure: LOOP ELECTROSURGICAL EXCISION PROCEDURE (LEEP);  Surgeon: Meriel Pica, MD;  Location: [facility];  Service: Gynecology;  Laterality: N/A;   WISDOM TOOTH EXTRACTION       OB History     Gravida  2   Para  1   Term  1   Preterm      AB  1   Living  1      SAB  1   IAB      Ectopic      Multiple  0   Live Births  1           Family History  Problem Relation Age of Onset   COPD Mother    Anxiety disorder Mother    Dementia Mother    Stroke Mother    Diabetes Father    Hypertension Father     Social History  Tobacco Use   Smoking status: Never   Smokeless tobacco: Never  Vaping Use   Vaping Use: Never used  Substance Use Topics   Alcohol use: No    Comment: socially   Drug use: No    Home Medications Prior to Admission medications   Medication Sig Start Date End Date Taking? Authorizing Provider  albuterol (VENTOLIN HFA) 108 (90 Base) MCG/ACT inhaler Inhale into the lungs every 6 (six) hours as needed for wheezing or shortness of breath.   Yes [provider]  dicyclomine (BENTYL) 20 MG tablet Take 20 mg by mouth every 6 (six) hours. Cramping 08/29/20  Yes [provider]  etonogestrel (NEXPLANON) 68 MG IMPL implant 68 mg by Subdermal route once.   Yes [provider]  famotidine (PEPCID) 10 MG tablet Take 10 mg by mouth as  needed for heartburn or indigestion.   Yes [provider]  glucose blood (ONETOUCH VERIO) test strip Use as instructed to check blood sugar up to 3 times a day 12/07/19  Yes [name], NP  Lancets [facility] DELICA PLUS [place]) MISC Use as instructed to check blood sugar up to 3 times a day 12/07/19  Yes [name], NP  metFORMIN (GLUCOPHAGE-XR) 500 MG 24 hr tablet TAKE 1 TABLET (500 MG TOTAL) BY MOUTH DAILY WITH BREAKFAST. FOR DIABETES. 06/21/20  Yes [name], NP  Multiple Vitamin (MULTIVITAMIN) capsule Take by mouth.   Yes [provider]  MYDAYIS 37.5 MG CP24 Take 1 capsule by mouth every morning. 08/01/20  Yes [provider]  ondansetron (ZOFRAN ODT) 4 MG disintegrating tablet 4mg  ODT q4 hours prn nausea/vomit 02/09/21  Yes 04/12/21, MD  pantoprazole (PROTONIX) 20 MG tablet Take 1 tablet (20 mg total) by mouth daily. 02/09/21  Yes 04/12/21, MD  rosuvastatin (CRESTOR) 5 MG tablet Take 1 tablet (5 mg total) by mouth daily. 12/19/20  Yes Chandrasekhar, Mahesh A, MD  valACYclovir (VALTREX) 500 MG tablet Take 500 mg by mouth daily as needed (for outbreaks).   Yes [provider]  Vitamin D, Ergocalciferol, (DRISDOL) 1.25 MG (50000 UNIT) CAPS capsule Take 1 capsule by mouth once weekly for 12 weeks. Patient not taking: Reported on 02/09/2021 08/12/20   10/10/20, NP    Allergies    Patient has no known allergies.  Review of Systems   Review of Systems  Constitutional:  Negative for appetite change and fatigue.  HENT:  Negative for congestion, ear discharge and sinus pressure.   Eyes:  Negative for discharge.  Respiratory:  Negative for cough.   Cardiovascular:  Negative for chest pain.  Gastrointestinal:  Positive for abdominal pain. Negative for diarrhea.  Genitourinary:  Negative for frequency and hematuria.  Musculoskeletal:  Negative for back pain.  Skin:  Negative for rash.  Neurological:  Negative for  seizures and headaches.  Psychiatric/Behavioral:  Negative for hallucinations.    Physical Exam Updated Vital Signs BP 107/67   Pulse 71   Temp (!) 97.4 F (36.3 C) (Oral)   Resp 18   Ht 5\' 2"  (1.575 m)   Wt 79.4 kg   SpO2 100%   BMI 32.01 kg/m   Physical Exam Vitals and nursing note reviewed.  Constitutional:      Appearance: She is well-developed.  HENT:     Head: Normocephalic.     Nose: Nose normal.  Eyes:     General: No scleral icterus.    Conjunctiva/sclera: Conjunctivae normal.  Neck:  Thyroid: No thyromegaly.  Cardiovascular:     Rate and Rhythm: Normal rate and regular rhythm.     Heart sounds: No murmur heard.   No friction rub. No gallop.  Pulmonary:     Breath sounds: No stridor. No wheezing or rales.  Chest:     Chest wall: No tenderness.  Abdominal:     General: There is no distension.     Tenderness: There is abdominal tenderness. There is no rebound.  Musculoskeletal:        General: Normal range of motion.     Cervical back: Neck supple.  Lymphadenopathy:     Cervical: No cervical adenopathy.  Skin:    Findings: No erythema or rash.  Neurological:     Mental Status: She is alert and oriented to person, place, and time.     Motor: No abnormal muscle tone.     Coordination: Coordination normal.  Psychiatric:        Behavior: Behavior normal.    ED Results / Procedures / Treatments   Labs (all labs ordered are listed, but only abnormal results are displayed) Labs Reviewed  CBC WITH DIFFERENTIAL/PLATELET - Abnormal; Notable for the following components:      Result Value   WBC 16.5 (*)    Neutro Abs 13.4 (*)    All other components within normal limits  COMPREHENSIVE METABOLIC PANEL - Abnormal; Notable for the following components:   Glucose, Bld 222 (*)    Total Protein 8.8 (*)    All other components within normal limits  LIPASE, BLOOD  I-STAT BETA HCG BLOOD, ED ([facility], [facility], [facility] ONLY)    EKG None  Radiology CT ABDOMEN PELVIS W  CONTRAST  Result Date: 02/09/2021 CLINICAL DATA:  Epigastric pain, nausea and vomiting. EXAM: CT ABDOMEN AND PELVIS WITH CONTRAST TECHNIQUE: Multidetector CT imaging of the abdomen and pelvis was performed using the standard protocol following bolus administration of intravenous contrast. CONTRAST:  60 mL OMNIPAQUE IOHEXOL 300 MG/ML  SOLN COMPARISON:  CT abdomen and pelvis 08/31/2018 and 10/07/2017. FINDINGS: Lower chest: Mild dependent atelectasis is seen. Lung bases otherwise clear. Hepatobiliary: No focal liver abnormality is seen. No gallstones, gallbladder wall thickening, or biliary dilatation. Pancreas: Unremarkable. No pancreatic ductal dilatation or surrounding inflammatory changes. Spleen: Normal in size without focal abnormality. Adrenals/Urinary Tract: The adrenal glands appear normal. Mild fullness of the right intrarenal collecting system is chronic and unchanged. No urinary tract stones are identified. Ureters and urinary bladder appear normal. Stomach/Bowel: Stomach is within normal limits. Appendix appears normal. No evidence of bowel wall thickening, distention, or inflammatory changes. Vascular/Lymphatic: No significant vascular findings are present. No enlarged abdominal or pelvic lymph nodes. Reproductive: Uterus and bilateral adnexa are unremarkable. Other: Small fat containing umbilical and supraumbilical hernias are unchanged. Musculoskeletal: Negative. IMPRESSION: No acute abnormality abdomen or pelvis. No finding to explain the patient's symptoms. Unchanged small fat containing umbilical and supraumbilical hernias. Electronically Signed   By: Drusilla Kanner M.D.   On: 02/09/2021 13:07   DG ABD ACUTE 2+V W 1V CHEST  Result Date: 02/09/2021 CLINICAL DATA:  Nausea and vomiting. EXAM: DG ABDOMEN ACUTE WITH 1 VIEW CHEST COMPARISON:  None. FINDINGS: Single-view of the chest: Heart size and mediastinal contours are within normal limits. Lungs are clear. No pleural effusion or pneumothorax.  Supine and upright views of the abdomen: No dilated large or small bowel loops. Numerous air-fluid levels throughout the abdomen. No evidence of free intraperitoneal air. No evidence of renal or ureteral calculi.  Visualized osseous structures are unremarkable. IMPRESSION: 1. Numerous air-fluid levels throughout the abdomen suggesting gastroenteritis. No dilated bowel loops. 2. No evidence of acute cardiopulmonary abnormality. No evidence of pneumonia or pulmonary edema. Electronically Signed   By: Bary RichardStan  Maynard M.D.   On: 02/09/2021 11:13    Procedures Procedures   Medications Ordered in ED Medications  HYDROmorphone (DILAUDID) injection 1 mg (1 mg Intravenous Given 02/09/21 0819)  LORazepam (ATIVAN) injection 0.5 mg (0.5 mg Intravenous Given 02/09/21 0818)  sodium chloride 0.9 % bolus 1,000 mL (1,000 mLs Intravenous Bolus 02/09/21 0819)  pantoprazole (PROTONIX) injection 40 mg (40 mg Intravenous Given 02/09/21 0819)  ondansetron (ZOFRAN) injection 4 mg (4 mg Intravenous Given 02/09/21 0841)  sodium chloride 0.9 % bolus 1,000 mL (1,000 mLs Intravenous Bolus 02/09/21 1115)  ondansetron (ZOFRAN) injection 4 mg (4 mg Intravenous Given 02/09/21 1253)  iohexol (OMNIPAQUE) 300 MG/ML solution 60 mL (60 mLs Intravenous Contrast Given 02/09/21 1237)    ED Course  I have reviewed the triage vital signs and the nursing notes.  Pertinent labs & imaging results that were available during my care of the patient were reviewed by me and considered in my medical decision making (see chart for details). Patient with persistent vomiting.  Labs show minimal elevation of white count otherwise negative CT exam negative.  Patient improved with nausea medicines and fluids.  Suspect exacerbation of her irritable bowel.  She will follow-up with her PCP   MDM Rules/Calculators/A&P                          Vomiting and dehydration with unremarkable CT.  Patient sent home on protonic and Zofran Final Clinical Impression(s) /  ED Diagnoses Final diagnoses:  Intractable vomiting with nausea, unspecified vomiting type    Rx / DC Orders ED Discharge Orders          Ordered    ondansetron (ZOFRAN ODT) 4 MG disintegrating tablet        02/09/21 1337    pantoprazole (PROTONIX) 20 MG tablet  Daily        02/09/21 1337             Bethann BerkshireZammit, Kanin Lia, MD 02/09/21 1342

## 2021-02-09 NOTE — ED Triage Notes (Signed)
Patient c/o severe epigastric pain with N/V x30 minutes.

## 2021-03-20 ENCOUNTER — Other Ambulatory Visit: Payer: Self-pay

## 2021-03-20 ENCOUNTER — Other Ambulatory Visit: Payer: 59

## 2021-03-20 DIAGNOSIS — E7800 Pure hypercholesterolemia, unspecified: Secondary | ICD-10-CM

## 2021-03-20 LAB — HEPATIC FUNCTION PANEL
ALT: 9 IU/L (ref 0–32)
AST: 13 IU/L (ref 0–40)
Albumin: 4.6 g/dL (ref 3.8–4.8)
Alkaline Phosphatase: 66 IU/L (ref 44–121)
Bilirubin Total: 0.3 mg/dL (ref 0.0–1.2)
Bilirubin, Direct: <0.1 mg/dL (ref 0.00–0.40)
Total Protein: 7.2 g/dL (ref 6.0–8.5)

## 2021-03-20 LAB — LIPID PANEL
Chol/HDL Ratio: 7.1 ratio — ABNORMAL HIGH (ref 0.0–4.4)
Cholesterol, Total: 241 mg/dL — ABNORMAL HIGH (ref 100–199)
HDL: 34 mg/dL — ABNORMAL LOW (ref >39)
LDL Chol Calc (NIH): 182 mg/dL — ABNORMAL HIGH (ref 0–99)
Triglycerides: 135 mg/dL (ref 0–149)
VLDL Cholesterol Cal: 25 mg/dL (ref 5–40)

## 2021-04-18 ENCOUNTER — Encounter: Payer: Self-pay | Admitting: Primary Care

## 2021-04-18 ENCOUNTER — Telehealth (INDEPENDENT_AMBULATORY_CARE_PROVIDER_SITE_OTHER): Payer: 59 | Admitting: Primary Care

## 2021-04-18 VITALS — Ht 62.0 in | Wt 175.0 lb

## 2021-04-18 DIAGNOSIS — R109 Unspecified abdominal pain: Secondary | ICD-10-CM

## 2021-04-18 DIAGNOSIS — F411 Generalized anxiety disorder: Secondary | ICD-10-CM | POA: Diagnosis not present

## 2021-04-18 DIAGNOSIS — E785 Hyperlipidemia, unspecified: Secondary | ICD-10-CM

## 2021-04-18 DIAGNOSIS — E559 Vitamin D deficiency, unspecified: Secondary | ICD-10-CM | POA: Diagnosis not present

## 2021-04-18 DIAGNOSIS — E119 Type 2 diabetes mellitus without complications: Secondary | ICD-10-CM | POA: Diagnosis not present

## 2021-04-18 MED ORDER — HYDROXYZINE HCL 10 MG PO TABS: 10.0000 mg | ORAL_TABLET | Freq: Every evening | ORAL | 0 refills | Status: DC | PRN

## 2021-04-18 NOTE — Assessment & Plan Note (Signed)
Acute for the last several days. UA, urine culture, CBC pending.  She is afebrile and symptoms have improved so lower suspicion for pyelonephritis.  ED precautions provided.

## 2021-04-18 NOTE — Assessment & Plan Note (Signed)
No longer on supplementation.  Repeat vitamin D level pending.

## 2021-04-18 NOTE — Progress Notes (Signed)
Patient ID: Brenda Valdez, female    DOB: 09/23/1985, 35 y.o.   MRN: [id number]  Virtual visit completed through [employer], a video enabled telemedicine application. Due to national recommendations of social distancing due to COVID-19, a virtual visit is felt to be most appropriate for this patient at this time. Reviewed limitations, risks, security and privacy concerns of performing a virtual visit and the availability of in person appointments. I also reviewed that there may be a patient responsible charge related to this service. The patient agreed to proceed.   Patient location: Work Provider location: Adult nurse at [employer], office Persons participating in this virtual visit: patient, provider   If any vitals were documented, they were collected by patient at home unless specified below.    Ht 5\' 2"  (1.575 m)   Wt 175 lb (79.4 kg)   BMI 32.01 kg/m    CC: Multiple concerns.  Subjective:   HPI: Brenda Valdez is a 35 y.o. female with a history of type 2 diabetes, IBS, hyperlipidemia presenting on 04/18/2021 for Acute Visit (Frequent urination, bilateral flank pain, elevated blood sugar x 1 week)  She's been under a lot of stress with family and personal issues, also with work since, chronic since May 2022. She has been written up at work for absences, has had financial issues given her absences. She has since taken on a part time job along with her full time job. Symptoms include worry, inability to sleep, anxious, irritable.  She is requesting something to take as needed for her anxiety symptoms.  Symptoms are daily.  Three weeks ago she began to notice urinary frequency, night sweats, fatigue which prompted her to start checking blood sugars. Glucose is ranging 180's over the last two weeks. She is managed on metformin XR 500 mg. She is not consistently compliant, has been compliant for the last 2 weeks.   She's noticed bilateral flank pain for the last two days, intermittent. This  pain doesn't feel like a muscle. Urine has been darker off and on. She denies fevers. Has been pushing water intake with some improvement.   She would like her vitamin D checked. She is not taking a supplement.       Relevant past medical, surgical, family and social history reviewed and updated as indicated. Interim medical history since our last visit reviewed. Allergies and medications reviewed and updated. Outpatient Medications Prior to Visit  Medication Sig Dispense Refill   albuterol (VENTOLIN HFA) 108 (90 Base) MCG/ACT inhaler Inhale into the lungs every 6 (six) hours as needed for wheezing or shortness of breath.     dicyclomine (BENTYL) 20 MG tablet Take 20 mg by mouth every 6 (six) hours. Cramping     etonogestrel (NEXPLANON) 68 MG IMPL implant 68 mg by Subdermal route once.     folic acid (FOLVITE) 1 MG tablet Take 1 mg by mouth daily.     glucose blood (ONETOUCH VERIO) test strip Use as instructed to check blood sugar up to 3 times a day 100 each 5   Lancets (ONETOUCH DELICA PLUS LANCET33G) MISC Use as instructed to check blood sugar up to 3 times a day 100 each 5   metFORMIN (GLUCOPHAGE-XR) 500 MG 24 hr tablet TAKE 1 TABLET (500 MG TOTAL) BY MOUTH DAILY WITH BREAKFAST. FOR DIABETES. 30 tablet 5   Multiple Vitamin (MULTIVITAMIN) capsule Take by mouth.     MYDAYIS 37.5 MG CP24 Take 1 capsule by mouth every morning.  ondansetron (ZOFRAN ODT) 4 MG disintegrating tablet 4mg  ODT q4 hours prn nausea/vomit 12 tablet 0   pantoprazole (PROTONIX) 20 MG tablet Take 1 tablet (20 mg total) by mouth daily. 15 tablet 0   rosuvastatin (CRESTOR) 5 MG tablet Take 1 tablet (5 mg total) by mouth daily. 90 tablet 3   valACYclovir (VALTREX) 500 MG tablet Take 500 mg by mouth daily as needed (for outbreaks).     famotidine (PEPCID) 10 MG tablet Take 10 mg by mouth as needed for heartburn or indigestion.     Vitamin D, Ergocalciferol, (DRISDOL) 1.25 MG (50000 UNIT) CAPS capsule Take 1 capsule by  mouth once weekly for 12 weeks. (Patient not taking: Reported on 02/09/2021) 12 capsule 0   No facility-administered medications prior to visit.     Per HPI unless specifically indicated in ROS section below Review of Systems  Constitutional:  Positive for fatigue. Negative for fever.  Genitourinary:  Positive for flank pain and frequency. Negative for dysuria.       Dark urine color  Psychiatric/Behavioral:  Positive for sleep disturbance. The patient is nervous/anxious.   Objective:  Ht 5\' 2"  (1.575 m)   Wt 175 lb (79.4 kg)   BMI 32.01 kg/m   Wt Readings from Last 3 Encounters:  04/18/21 175 lb (79.4 kg)  02/09/21 175 lb (79.4 kg)  12/19/20 176 lb (79.8 kg)       Physical exam: Gen: alert, NAD, not ill appearing Pulm: speaks in complete sentences without increased work of breathing Psych: Appears stressed, tearful at times during visit     Results for orders placed or performed in visit on 03/20/21  Hepatic function panel  Result Value Ref Range   Total Protein 7.2 6.0 - 8.5 g/dL   Albumin 4.6 3.8 - 4.8 g/dL   Bilirubin Total 0.3 0.0 - 1.2 mg/dL   Bilirubin, Direct 12/21/20 0.00 - 0.40 mg/dL   Alkaline Phosphatase 66 44 - 121 IU/L   AST 13 0 - 40 IU/L   ALT 9 0 - 32 IU/L  Lipid panel  Result Value Ref Range   Cholesterol, Total 241 (H) 100 - 199 mg/dL   Triglycerides 03/22/21 0 - 149 mg/dL   HDL 34 (L) <4.09 mg/dL   VLDL Cholesterol Cal 25 5 - 40 mg/dL   LDL Chol Calc (NIH) 811 (H) 0 - 99 mg/dL   Chol/HDL Ratio 7.1 (H) 0.0 - 4.4 ratio   Assessment & Plan:   Problem List Items Addressed This Visit       Endocrine   Type 2 diabetes mellitus (HCC)    No recent follow-up despite recommendations. Repeat A1c pending.  Glucose readings appear high. Will have her double up on metformin to take 1000 mg daily.  Await results.      Relevant Orders   Basic metabolic panel   Hemoglobin A1c     Other   Hyperlipidemia    LDL of 182 from labs in August 2022. She is  prescribed rosuvastatin 5 mg, however not sure if she is taking.  Repeat lipid panel pending today. If not taking rosuvastatin 5 mg, then recommend she resume, especially in the setting of her family history, her type 2 diabetes, and LDL level.      Relevant Orders   Lipid panel   Flank pain    Acute for the last several days. UA, urine culture, CBC pending.  She is afebrile and symptoms have improved so lower suspicion for pyelonephritis.  ED precautions  provided.        Relevant Orders   CBC with Differential/Platelet   POCT Urinalysis Dipstick (Automated)   Urine Culture   GAD (generalized anxiety disorder) - Primary    Acute on chronic symptoms, requesting treatment.  I recommended SSRI treatment given her daily and chronic symptoms.  She declines that she does not want to be on something "long-term".  Prescription for hydroxyzine sent to pharmacy for her to use for anxiety and sleep.  She will update if symptoms persist      Relevant Medications   hydrOXYzine (ATARAX/VISTARIL) 10 MG tablet   Vitamin D deficiency    No longer on supplementation.  Repeat vitamin D level pending.      Relevant Orders   VITAMIN D 25 Hydroxy (Vit-D Deficiency, Fractures)     Meds ordered this encounter  Medications   hydrOXYzine (ATARAX/VISTARIL) 10 MG tablet    Sig: Take 1-2 tablets (10-20 mg total) by mouth at bedtime as needed. For anxiety and sleep.    Dispense:  30 tablet    Refill:  0    Order Specific Question:   Supervising Provider    Answer:   Ermalene Searing, AMY E [[id number]]   Orders Placed This Encounter  Procedures   Urine Culture    Standing Status:   Future    Standing Expiration Date:   04/18/2022   VITAMIN D 25 Hydroxy (Vit-D Deficiency, Fractures)    Standing Status:   Future    Standing Expiration Date:   04/18/2022   CBC with Differential/Platelet    Standing Status:   Future    Standing Expiration Date:   04/18/2022   Basic metabolic panel    Standing Status:    Future    Standing Expiration Date:   04/18/2022   Hemoglobin A1c    Standing Status:   Future    Standing Expiration Date:   04/18/2022   Lipid panel    Standing Status:   Future    Standing Expiration Date:   04/18/2022   POCT Urinalysis Dipstick (Automated)    Standing Status:   Future    Standing Expiration Date:   05/18/2021    I discussed the assessment and treatment plan with the patient. The patient was provided an opportunity to ask questions and all were answered. The patient agreed with the plan and demonstrated an understanding of the instructions. The patient was advised to call back or seek an in-person evaluation if the symptoms worsen or if the condition fails to improve as anticipated.  Follow up plan:  Come by for labs as scheduled.  You may take the hydroxyzine at bedtime for sleep and anxiety.  You may double up on your metformin as discussed.  Please keep me updated.  Mayra Reel, NP-C   [name], NP

## 2021-04-18 NOTE — Assessment & Plan Note (Signed)
Acute on chronic symptoms, requesting treatment.  I recommended SSRI treatment given her daily and chronic symptoms.  She declines that she does not want to be on something "long-term".  Prescription for hydroxyzine sent to pharmacy for her to use for anxiety and sleep.  She will update if symptoms persist

## 2021-04-18 NOTE — Assessment & Plan Note (Deleted)
LDL of 182 from labs in August 2022. She is prescribed rosuvastatin 5 mg, however not sure if she is taking.  Repeat lipid panel pending today. If not taking rosuvastatin 5 mg, then recommend she resume, especially in the setting of her family history, her type 2 diabetes, and LDL level. 

## 2021-04-18 NOTE — Assessment & Plan Note (Signed)
LDL of 182 from labs in August 2022. She is prescribed rosuvastatin 5 mg, however not sure if she is taking.  Repeat lipid panel pending today. If not taking rosuvastatin 5 mg, then recommend she resume, especially in the setting of her family history, her type 2 diabetes, and LDL level.

## 2021-04-18 NOTE — Assessment & Plan Note (Signed)
No recent follow-up despite recommendations. Repeat A1c pending.  Glucose readings appear high. Will have her double up on metformin to take 1000 mg daily.  Await results.

## 2021-04-18 NOTE — Patient Instructions (Signed)
  Come by for labs as scheduled.  You may take the hydroxyzine at bedtime for sleep and anxiety.  You may double up on your metformin as discussed.  Please keep me updated.  Mayra Reel, NP-C

## 2021-04-21 ENCOUNTER — Other Ambulatory Visit (INDEPENDENT_AMBULATORY_CARE_PROVIDER_SITE_OTHER): Payer: 59

## 2021-04-21 ENCOUNTER — Other Ambulatory Visit: Payer: Self-pay

## 2021-04-21 DIAGNOSIS — E559 Vitamin D deficiency, unspecified: Secondary | ICD-10-CM | POA: Diagnosis not present

## 2021-04-21 DIAGNOSIS — R109 Unspecified abdominal pain: Secondary | ICD-10-CM

## 2021-04-21 DIAGNOSIS — E785 Hyperlipidemia, unspecified: Secondary | ICD-10-CM

## 2021-04-21 DIAGNOSIS — E119 Type 2 diabetes mellitus without complications: Secondary | ICD-10-CM

## 2021-04-21 LAB — POC URINALSYSI DIPSTICK (AUTOMATED)
Bilirubin, UA: NEGATIVE
Blood, UA: NEGATIVE
Glucose, UA: NEGATIVE
Ketones, UA: NEGATIVE
Leukocytes, UA: NEGATIVE
Nitrite, UA: NEGATIVE
Protein, UA: NEGATIVE
Spec Grav, UA: <=1.005 — AB (ref 1.010–1.025)
Urobilinogen, UA: 0.2 E.U./dL
pH, UA: 7 (ref 5.0–8.0)

## 2021-04-21 LAB — BASIC METABOLIC PANEL
BUN: 13 mg/dL (ref 6–23)
CO2: 26 mEq/L (ref 19–32)
Calcium: 9.2 mg/dL (ref 8.4–10.5)
Chloride: 101 mEq/L (ref 96–112)
Creatinine, Ser: 0.78 mg/dL (ref 0.40–1.20)
GFR: 99.37 mL/min (ref >60.00)
Glucose, Bld: 204 mg/dL — ABNORMAL HIGH (ref 70–99)
Potassium: 4.1 mEq/L (ref 3.5–5.1)
Sodium: 133 mEq/L — ABNORMAL LOW (ref 135–145)

## 2021-04-21 LAB — CBC WITH DIFFERENTIAL/PLATELET
Basophils Absolute: 0 10*3/uL (ref 0.0–0.1)
Basophils Relative: 0.4 % (ref 0.0–3.0)
Eosinophils Absolute: 0.2 10*3/uL (ref 0.0–0.7)
Eosinophils Relative: 2.2 % (ref 0.0–5.0)
HCT: 37.4 % (ref 36.0–46.0)
Hemoglobin: 11.9 g/dL — ABNORMAL LOW (ref 12.0–15.0)
Lymphocytes Relative: 35.1 % (ref 12.0–46.0)
Lymphs Abs: 3 10*3/uL (ref 0.7–4.0)
MCHC: 31.8 g/dL (ref 30.0–36.0)
MCV: 84 fl (ref 78.0–100.0)
Monocytes Absolute: 0.5 10*3/uL (ref 0.1–1.0)
Monocytes Relative: 6.3 % (ref 3.0–12.0)
Neutro Abs: 4.7 10*3/uL (ref 1.4–7.7)
Neutrophils Relative %: 56 % (ref 43.0–77.0)
Platelets: 283 10*3/uL (ref 150.0–400.0)
RBC: 4.45 Mil/uL (ref 3.87–5.11)
RDW: 13.1 % (ref 11.5–15.5)
WBC: 8.5 10*3/uL (ref 4.0–10.5)

## 2021-04-21 LAB — LIPID PANEL
Cholesterol: 157 mg/dL (ref 0–200)
HDL: 36.3 mg/dL — ABNORMAL LOW (ref >39.00)
LDL Cholesterol: 98 mg/dL (ref 0–99)
NonHDL: 120.37
Total CHOL/HDL Ratio: 4
Triglycerides: 114 mg/dL (ref 0.0–149.0)
VLDL: 22.8 mg/dL (ref 0.0–40.0)

## 2021-04-21 LAB — HEMOGLOBIN A1C: Hgb A1c MFr Bld: 8 % — ABNORMAL HIGH (ref 4.6–6.5)

## 2021-04-21 LAB — VITAMIN D 25 HYDROXY (VIT D DEFICIENCY, FRACTURES): VITD: 12.84 ng/mL — ABNORMAL LOW (ref 30.00–100.00)

## 2021-04-22 LAB — URINE CULTURE
MICRO NUMBER:: 12391686
SPECIMEN QUALITY:: ADEQUATE

## 2021-04-23 ENCOUNTER — Other Ambulatory Visit: Payer: Self-pay | Admitting: Primary Care

## 2021-04-23 DIAGNOSIS — N3000 Acute cystitis without hematuria: Secondary | ICD-10-CM

## 2021-04-23 DIAGNOSIS — E119 Type 2 diabetes mellitus without complications: Secondary | ICD-10-CM

## 2021-04-23 MED ORDER — AMOXICILLIN 875 MG PO TABS: 875.0000 mg | ORAL_TABLET | Freq: Two times a day (BID) | ORAL | 0 refills | Status: DC

## 2021-04-23 MED ORDER — METFORMIN HCL ER 500 MG PO TB24: 1000.0000 mg | ORAL_TABLET | Freq: Every day | ORAL | 0 refills | Status: DC

## 2021-05-01 DIAGNOSIS — E559 Vitamin D deficiency, unspecified: Secondary | ICD-10-CM

## 2021-05-01 MED ORDER — VITAMIN D (ERGOCALCIFEROL) 1.25 MG (50000 UNIT) PO CAPS
ORAL_CAPSULE | ORAL | 0 refills | Status: DC
Start: 2021-05-01 — End: 2021-11-07

## 2021-05-01 NOTE — Telephone Encounter (Signed)
Can you check on Ardean Rozycki glucometer? 11/[birth year]44

## 2021-05-12 ENCOUNTER — Other Ambulatory Visit: Payer: Self-pay | Admitting: Primary Care

## 2021-05-12 DIAGNOSIS — F411 Generalized anxiety disorder: Secondary | ICD-10-CM

## 2021-06-18 DIAGNOSIS — E119 Type 2 diabetes mellitus without complications: Secondary | ICD-10-CM

## 2021-06-19 MED ORDER — FREESTYLE LIBRE 3 SENSOR MISC: 1.0000 [IU] | Freq: Four times a day (QID) | 0 refills | Status: DC

## 2021-07-09 ENCOUNTER — Telehealth: Payer: Self-pay | Admitting: Primary Care

## 2021-07-09 NOTE — Telephone Encounter (Signed)
Message sent in patient chart no further action needed.

## 2021-07-09 NOTE — Telephone Encounter (Signed)
MRS. Robitaille STATED THAT SHE WANTED TO LET KATE KNOW THAT HER MOM ARDEAN ALSTON -[place]  SHE HAD A FALL AND IT TURNED INTO A SEIZURE AND IS IN ICU.  

## 2021-07-16 ENCOUNTER — Telehealth: Payer: 59 | Admitting: Primary Care

## 2021-07-16 ENCOUNTER — Other Ambulatory Visit: Payer: Self-pay | Admitting: Primary Care

## 2021-07-16 ENCOUNTER — Other Ambulatory Visit: Payer: Self-pay

## 2021-07-16 DIAGNOSIS — E119 Type 2 diabetes mellitus without complications: Secondary | ICD-10-CM

## 2021-07-22 ENCOUNTER — Ambulatory Visit: Payer: 59 | Admitting: Primary Care

## 2021-07-30 NOTE — Telephone Encounter (Signed)
Will you print these for me?

## 2021-07-31 NOTE — Telephone Encounter (Signed)
Printed and placed in your office inbox.

## 2021-08-04 ENCOUNTER — Other Ambulatory Visit: Payer: Self-pay | Admitting: Primary Care

## 2021-08-04 DIAGNOSIS — E559 Vitamin D deficiency, unspecified: Secondary | ICD-10-CM

## 2021-08-05 NOTE — Telephone Encounter (Signed)
Forms completed and placed in Joellen's inbox. 

## 2021-08-07 ENCOUNTER — Encounter: Payer: Self-pay | Admitting: Primary Care

## 2021-08-07 ENCOUNTER — Ambulatory Visit (INDEPENDENT_AMBULATORY_CARE_PROVIDER_SITE_OTHER): Payer: 59 | Admitting: Primary Care

## 2021-08-07 ENCOUNTER — Other Ambulatory Visit: Payer: Self-pay

## 2021-08-07 VITALS — BP 110/62 | HR 77 | Temp 97.8°F | Ht 62.0 in | Wt 178.0 lb

## 2021-08-07 DIAGNOSIS — E559 Vitamin D deficiency, unspecified: Secondary | ICD-10-CM

## 2021-08-07 DIAGNOSIS — E785 Hyperlipidemia, unspecified: Secondary | ICD-10-CM

## 2021-08-07 DIAGNOSIS — E119 Type 2 diabetes mellitus without complications: Secondary | ICD-10-CM | POA: Diagnosis not present

## 2021-08-07 DIAGNOSIS — F411 Generalized anxiety disorder: Secondary | ICD-10-CM | POA: Diagnosis not present

## 2021-08-07 LAB — POCT GLYCOSYLATED HEMOGLOBIN (HGB A1C): Hemoglobin A1C: 9.7 % — AB (ref 4.0–5.6)

## 2021-08-07 MED ORDER — HYDROXYZINE HCL 10 MG PO TABS: 10.0000 mg | ORAL_TABLET | Freq: Every evening | ORAL | 1 refills | Status: DC | PRN

## 2021-08-07 NOTE — Patient Instructions (Signed)
Resume all medications!  It was a pleasure to see you today!  Diabetes Mellitus and Nutrition, Adult When you have diabetes, or diabetes mellitus, it is very important to have healthy eating habits because your blood sugar (glucose) levels are greatly affected by what you eat and drink. Eating healthy foods in the right amounts, at about the same times every day, can help you: Manage your blood glucose. Lower your risk of heart disease. Improve your blood pressure. Reach or maintain a healthy weight. What can affect my meal plan? Every person with diabetes is different, and each person has different needs for a meal plan. Your health care provider may recommend that you work with a dietitian to make a meal plan that is best for you. Your meal plan may vary depending on factors such as: The calories you need. The medicines you take. Your weight. Your blood glucose, blood pressure, and cholesterol levels. Your activity level. Other health conditions you have, such as heart or kidney disease. How do carbohydrates affect me? Carbohydrates, also called carbs, affect your blood glucose level more than any other type of food. Eating carbs raises the amount of glucose in your blood. It is important to know how many carbs you can safely have in each meal. This is different for every person. Your dietitian can help you calculate how many carbs you should have at each meal and for each snack. How does alcohol affect me? Alcohol can cause a decrease in blood glucose (hypoglycemia), especially if you use insulin or take certain diabetes medicines by mouth. Hypoglycemia can be a life-threatening condition. Symptoms of hypoglycemia, such as sleepiness, dizziness, and confusion, are similar to symptoms of having too much alcohol. Do not drink alcohol if: Your health care provider tells you not to drink. You are pregnant, may be pregnant, or are planning to become pregnant. If you drink alcohol: Limit how  much you have to: 0-1 drink a day for women. 0-2 drinks a day for men. Know how much alcohol is in your drink. In the [place] one drink equals one 12 oz bottle of beer (355 mL), one 5 oz glass of wine (148 mL), or one 1 oz glass of hard liquor (44 mL). Keep yourself hydrated with water, diet soda, or unsweetened iced tea. Keep in mind that regular soda, juice, and other mixers may contain a lot of sugar and must be counted as carbs. What are tips for following this plan? Reading food labels Start by checking the serving size on the Nutrition Facts label of packaged foods and drinks. The number of calories and the amount of carbs, fats, and other nutrients listed on the label are based on one serving of the item. Many items contain more than one serving per package. Check the total grams (g) of carbs in one serving. Check the number of grams of saturated fats and trans fats in one serving. Choose foods that have a low amount or none of these fats. Check the number of milligrams (mg) of salt (sodium) in one serving. Most people should limit total sodium intake to less than 2,300 mg per day. Always check the nutrition information of foods labeled as "low-fat" or "nonfat." These foods may be higher in added sugar or refined carbs and should be avoided. Talk to your dietitian to identify your daily goals for nutrients listed on the label. Shopping Avoid buying canned, pre-made, or processed foods. These foods tend to be high in fat, sodium, and added sugar. Shop  around the outside edge of the grocery store. This is where you will most often find fresh fruits and vegetables, bulk grains, fresh meats, and fresh dairy products. Cooking Use low-heat cooking methods, such as baking, instead of high-heat cooking methods, such as deep frying. Cook using healthy oils, such as olive, canola, or sunflower oil. Avoid cooking with butter, cream, or high-fat meats. Meal planning Eat meals and snacks regularly,  preferably at the same times every day. Avoid going long periods of time without eating. Eat foods that are high in fiber, such as fresh fruits, vegetables, beans, and whole grains. Eat 4-6 oz (112-168 g) of lean protein each day, such as lean meat, chicken, fish, eggs, or tofu. One ounce (oz) (28 g) of lean protein is equal to: 1 oz (28 g) of meat, chicken, or fish. 1 egg.  cup (62 g) of tofu. Eat some foods each day that contain healthy fats, such as avocado, nuts, seeds, and fish. What foods should I eat? Fruits Berries. Apples. Oranges. Peaches. Apricots. Plums. Grapes. Mangoes. Papayas. Pomegranates. Kiwi. Cherries. Vegetables Leafy greens, including lettuce, spinach, kale, chard, collard greens, mustard greens, and cabbage. Beets. Cauliflower. Broccoli. Carrots. Green beans. Tomatoes. Peppers. Onions. Cucumbers. Brussels sprouts. Grains Whole grains, such as whole-wheat or whole-grain bread, crackers, tortillas, cereal, and pasta. Unsweetened oatmeal. Quinoa. Brown or wild rice. Meats and other proteins Seafood. Poultry without skin. Lean cuts of poultry and beef. Tofu. Nuts. Seeds. Dairy Low-fat or fat-free dairy products such as milk, yogurt, and cheese. The items listed above may not be a complete list of foods and beverages you can eat and drink. Contact a dietitian for more information. What foods should I avoid? Fruits Fruits canned with syrup. Vegetables Canned vegetables. Frozen vegetables with butter or cream sauce. Grains Refined white flour and flour products such as bread, pasta, snack foods, and cereals. Avoid all processed foods. Meats and other proteins Fatty cuts of meat. Poultry with skin. Breaded or fried meats. Processed meat. Avoid saturated fats. Dairy Full-fat yogurt, cheese, or milk. Beverages Sweetened drinks, such as soda or iced tea. The items listed above may not be a complete list of foods and beverages you should avoid. Contact a dietitian for more  information. Questions to ask a health care provider Do I need to meet with a certified diabetes care and education specialist? Do I need to meet with a dietitian? What number can I call if I have questions? When are the best times to check my blood glucose? Where to find more information: American Diabetes Association: diabetes.org Academy of Nutrition and Dietetics: eatright.[employer] of Diabetes and Digestive and Kidney Diseases: StageSync.si Association of Diabetes Care & Education Specialists: diabeteseducator.org Summary It is important to have healthy eating habits because your blood sugar (glucose) levels are greatly affected by what you eat and drink. It is important to use alcohol carefully. A healthy meal plan will help you manage your blood glucose and lower your risk of heart disease. Your health care provider may recommend that you work with a dietitian to make a meal plan that is best for you. This information is not intended to replace advice given to you by your health care provider. Make sure you discuss any questions you have with your health care provider. Document Revised: 02/21/2020 Document Reviewed: 02/21/2020 Elsevier Patient Education  2022 [employer].

## 2021-08-07 NOTE — Assessment & Plan Note (Signed)
Refills provided for hydroxyzine 10 mg to use PRN.

## 2021-08-07 NOTE — Assessment & Plan Note (Signed)
Non compliant to vitamin D, discussed to resume.  Resume vitamin D 50,000 IU weekly. Repeat vitamin D next visit.

## 2021-08-07 NOTE — Progress Notes (Signed)
Subjective:    Patient ID: Brenda Valdez, female    DOB: 1985-11-17, 36 y.o.   MRN: [id number]  HPI  Brenda Valdez is a very pleasant 36 y.o. female with a history of type 2 diabetes, vitamin D deficiency, IBS, hyperlipidemia, HSV, GAD who presents today for follow up of chronic conditions and to discuss anxiety.  1) GAD: Chronic, intermittent for years. Currently managed on hydroxyzine 10 mg PRN. She has declined daily treatment during prior visits.   Today she endorses being under a lot of stress with her mothers health and with her work. Symptoms include difficulty falling asleep. She doesn't want to be on a daily regimen as she has trouble remembering to take medications regularly.   She would like refills of hydroxyzine 10 mg as she did very well, used sparingly.   2) Type 2 Diabetes:   Current medications include: Metformin XR 1000 mg daily.   She's not taken metformin in 2-3 months as she's had a lot of stress. She has noticed polyuria and polydipsia.   She is checking her blood glucose sometimes and is getting readings of low 200's.   Last A1C: 8.0 in September 2022, 9.7 today  Last Eye Exam: Due Last Foot Exam: Due Pneumonia Vaccination: Due Urine Microalbumin: Due Statin: Crestor - inconsistent use.   Dietary changes since last visit: Poor diet overall, has recently improved.    Exercise: None, will start soon.   3) Vitamin D Deficiency: Currently managed on vitamin D 50,000 units weekly. Due for repeat vitamin D check today.  Today she endorses she's not been taking vitamin D regularly but plans to get back on track.    Review of Systems  Respiratory:  Negative for shortness of breath.   Cardiovascular:  Negative for chest pain.  Endocrine: Positive for polydipsia and polyuria.  Neurological:  Negative for dizziness.        Past Medical History:  Diagnosis Date   ADHD    Asthma    excercise induced   Chiari I malformation (HCC)    Decreased fetal  movement during pregnancy in third trimester, antepartum 12/05/2014   Decreased fetal movement in pregnancy in third trimester, antepartum 12/23/2014   Diabetes mellitus without complication (HCC)    Hyperlipidemia    IBS (irritable bowel syndrome)    Internal hemorrhoids    noted on colonoscopy from 2020   No pertinent past medical history    Vaginal Pap smear, abnormal     Social History   Socioeconomic History   Marital status: Single    Spouse name: Not on file   Number of children: 1   Years of education: BA   Highest education level: Not on file  Occupational History   Occupation: [employer]  Tobacco Use   Smoking status: Never   Smokeless tobacco: Never  Vaping Use   Vaping Use: Never used  Substance and Sexual Activity   Alcohol use: No    Comment: socially   Drug use: No   Sexual activity: Yes    Birth control/protection: None  Other Topics Concern   Not on file  Social History Narrative   Lives with mother and daughter   Caffeine use: Drinks 1/2 cup per day   Right handed    Social Determinants of Health   Financial Resource Strain: Not on file  Food Insecurity: Not on file  Transportation Needs: Not on file  Physical Activity: Not on file  Stress: Not on file  Social Connections: Not on file  Intimate Partner Violence: Not on file    Past Surgical History:  Procedure Laterality Date   CESAREAN SECTION N/A 12/25/2014   Procedure: CESAREAN SECTION;  Surgeon: Azucena Fallen, MD;  Location: [facility];  Service: Obstetrics;  Laterality: N/A;   LAPAROSCOPY  03/24/2011   Procedure: LAPAROSCOPY DIAGNOSTIC;  Surgeon: Margarette Asal;  Location: [facility];  Service: Gynecology;  Laterality: N/A;   LASER ABLATION CONDOLAMATA N/A 06/16/2013   Procedure:  CO2 LASER ABLATION CONDOLAMATA;  Surgeon: Margarette Asal, MD;  Location: [facility];  Service: Gynecology;  Laterality: N/A;  CO2 LASER ABLATION OF VAIN & CIN   LEEP  08/11/2012   Procedure: LOOP  ELECTROSURGICAL EXCISION PROCEDURE (LEEP);  Surgeon: Margarette Asal, MD;  Location: [facility];  Service: Gynecology;  Laterality: N/A;   WISDOM TOOTH EXTRACTION      Family History  Problem Relation Age of Onset   COPD Mother    Anxiety disorder Mother    Dementia Mother    Stroke Mother    Diabetes Father    Hypertension Father     No Known Allergies  Current Outpatient Medications on File Prior to Visit  Medication Sig Dispense Refill   albuterol (VENTOLIN HFA) 108 (90 Base) MCG/ACT inhaler Inhale into the lungs every 6 (six) hours as needed for wheezing or shortness of breath.     Continuous Blood Gluc Sensor (FREESTYLE LIBRE 3 SENSOR) MISC 1 Units by Does not apply route 4 (four) times daily. Place 1 sensor on the skin every 14 days. 6 each 0   dicyclomine (BENTYL) 20 MG tablet Take 20 mg by mouth every 6 (six) hours. Cramping     etonogestrel (NEXPLANON) 68 MG IMPL implant 68 mg by Subdermal route once.     folic acid (FOLVITE) 1 MG tablet Take 1 mg by mouth daily.     glucose blood (ONETOUCH VERIO) test strip Use as instructed to check blood sugar up to 3 times a day 100 each 5   hydrOXYzine (ATARAX/VISTARIL) 10 MG tablet Take 1-2 tablets (10-20 mg total) by mouth at bedtime as needed. For anxiety and sleep. 30 tablet 0   Lancets (ONETOUCH DELICA PLUS 123XX123) MISC Use as instructed to check blood sugar up to 3 times a day 100 each 5   metFORMIN (GLUCOPHAGE-XR) 500 MG 24 hr tablet TAKE 2 TABLETS (1,000 MG TOTAL) BY MOUTH DAILY WITH BREAKFAST. FOR DIABETES. 60 tablet 0   Multiple Vitamin (MULTIVITAMIN) capsule Take by mouth.     MYDAYIS 37.5 MG CP24 Take 1 capsule by mouth every morning.     ondansetron (ZOFRAN ODT) 4 MG disintegrating tablet 4mg  ODT q4 hours prn nausea/vomit 12 tablet 0   pantoprazole (PROTONIX) 20 MG tablet Take 1 tablet (20 mg total) by mouth daily. 15 tablet 0   rosuvastatin (CRESTOR) 5 MG tablet Take 1 tablet (5 mg total) by mouth daily. 90 tablet 3    valACYclovir (VALTREX) 500 MG tablet Take 500 mg by mouth daily as needed (for outbreaks).     Vitamin D, Ergocalciferol, (DRISDOL) 1.25 MG (50000 UNIT) CAPS capsule Take 1 capsule by mouth once weekly for 12 weeks. 12 capsule 0   No current facility-administered medications on file prior to visit.    BP 110/62    Pulse 77    Temp 97.8 F (36.6 C) (Temporal)    Ht 5\' 2"  (1.575 m)    Wt 178 lb (80.7 kg)    SpO2  97%    BMI 32.56 kg/m  Objective:   Physical Exam Cardiovascular:     Rate and Rhythm: Normal rate and regular rhythm.  Pulmonary:     Effort: Pulmonary effort is normal.     Breath sounds: Normal breath sounds.  Musculoskeletal:     Cervical back: Neck supple.  Skin:    General: Skin is warm and dry.  Psychiatric:        Mood and Affect: Mood normal.          Assessment & Plan:      This visit occurred during the SARS-CoV-2 public health emergency.  Safety protocols were in place, including screening questions prior to the visit, additional usage of staff PPE, and extensive cleaning of exam room while observing appropriate contact time as indicated for disinfecting solutions.

## 2021-08-07 NOTE — Assessment & Plan Note (Signed)
Non compliant to Crestor 5 mg, discussed to resume. She agrees and will do so.

## 2021-08-07 NOTE — Assessment & Plan Note (Signed)
Uncontrolled with A1C of 9.7 today. This makes sense as she's been non compliant to her metformin XR 1000 mg.  Discussed the absolute need for diabetes medication compliance including Crestor and Metformin XR 1000 mg daily. She verbalized understanding.  She has a back supply of Metformin XR at home and will notify when she is needing refills.  Resume metformin XR 1000 mg daily. She will also work on her diet and start exercising.  She will update if glucose readings remain at or above 150 consistently after a few weeks. Consider adding Glipizide if needed.  Follow up in 3 months.

## 2021-08-13 ENCOUNTER — Telehealth: Payer: Self-pay | Admitting: Primary Care

## 2021-08-13 ENCOUNTER — Other Ambulatory Visit: Payer: Self-pay

## 2021-08-13 ENCOUNTER — Encounter: Payer: Self-pay | Admitting: Primary Care

## 2021-08-13 ENCOUNTER — Ambulatory Visit: Payer: 59 | Admitting: Primary Care

## 2021-08-13 ENCOUNTER — Ambulatory Visit (INDEPENDENT_AMBULATORY_CARE_PROVIDER_SITE_OTHER)
Admission: RE | Admit: 2021-08-13 | Discharge: 2021-08-13 | Disposition: A | Discharge status: Home / Self Care | Payer: 59 | Source: Ambulatory Visit | Attending: Primary Care | Admitting: Primary Care

## 2021-08-13 VITALS — BP 120/62 | HR 97 | Temp 98.0°F | Ht 62.0 in | Wt 177.0 lb

## 2021-08-13 DIAGNOSIS — R051 Acute cough: Secondary | ICD-10-CM

## 2021-08-13 LAB — POC COVID19 BINAXNOW: SARS Coronavirus 2 Ag: NEGATIVE

## 2021-08-13 LAB — POCT INFLUENZA A/B
Influenza A, POC: NEGATIVE
Influenza B, POC: NEGATIVE

## 2021-08-13 MED ORDER — PREDNISONE 20 MG PO TABS: ORAL_TABLET | ORAL | 0 refills | Status: DC

## 2021-08-13 MED ORDER — GUAIFENESIN-CODEINE 100-10 MG/5ML PO SYRP: 5.0000 mL | ORAL_SOLUTION | Freq: Three times a day (TID) | ORAL | 0 refills | Status: DC | PRN

## 2021-08-13 MED ORDER — BENZONATATE 200 MG PO CAPS: 200.0000 mg | ORAL_CAPSULE | Freq: Three times a day (TID) | ORAL | 0 refills | Status: DC | PRN

## 2021-08-13 NOTE — Assessment & Plan Note (Signed)
Very representative of viral etiology.  Checking chest xray given high fevers, negative flu/covid tests, and history of pneumonia  Rx for prednisone 40 mg daily x 5 days sent to pharmacy. Rx for [employer] and Cheratussin syrup prescribed with drowsiness precautions.   Return precautions provided.

## 2021-08-13 NOTE — Telephone Encounter (Signed)
Pt want an in person appt because she has flu-like symptoms and she would like to get swabbed to see if she has the flu

## 2021-08-13 NOTE — Progress Notes (Signed)
Subjective:    Patient ID: Brenda Valdez, female    DOB: 1986-06-29, 36 y.o.   MRN: [id number]  HPI  Brenda Valdez is a very pleasant 36 y.o. female with a history of type2 diabetes - uncontrolled, asthma, CAP, hyperlipidemia, chiari I malformation, GAD who presents today to discuss acute cough.  Symptom onset three days ago with cough. She then developed chills, body aches, fatigue. Since then she developed a high fever of 102.1 (t-max) which continued until yesterday. No fever today. She continues with a cough that is congested, also shortness of breath and chest tightness.  She's been taking Mucinex, Tylenol, Ibuprofen, Nyquil. She tested negative for flu and Covid-19 two days ago at work.   She was in [place] at a baby gender reveal party prior to symptom onset, 60 people present at the party, and numerous people from that party have the same symptoms and several were diagnosed with influenza.  BP Readings from Last 3 Encounters:  08/13/21 120/62  08/07/21 110/62  02/09/21 115/78        Review of Systems  Constitutional:  Positive for chills, fatigue and fever.  HENT:  Negative for congestion.   Respiratory:  Positive for cough, chest tightness and shortness of breath.   Musculoskeletal:  Positive for arthralgias.        Past Medical History:  Diagnosis Date   ADHD    Asthma    excercise induced   Chiari I malformation (HCC)    Decreased fetal movement during pregnancy in third trimester, antepartum 12/05/2014   Decreased fetal movement in pregnancy in third trimester, antepartum 12/23/2014   Diabetes mellitus without complication (HCC)    Hyperlipidemia    IBS (irritable bowel syndrome)    Internal hemorrhoids    noted on colonoscopy from 2020   No pertinent past medical history    Vaginal Pap smear, abnormal     Social History   Socioeconomic History   Marital status: Single    Spouse name: Not on file   Number of children: 1   Years of education: BA    Highest education level: Not on file  Occupational History   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[place]  Tobacco Use   Smoking status: Never   Smokeless tobacco: Never  Vaping Use   Vaping Use: Never used  Substance and Sexual Activity   Alcohol use: No    Comment: socially   Drug use: No   Sexual activity: Yes    Birth control/protection: None  Other Topics Concern   Not on file  Social History Narrative   Lives with mother and daughter   Caffeine use: Drinks 1/2 cup per day   Right handed    Social Determinants of Health   Financial Resource Strain: Not on file  Food Insecurity: Not on file  Transportation Needs: Not on file  Physical Activity: Not on file  Stress: Not on file  Social Connections: Not on file  Intimate Partner Violence: Not on file    Past Surgical History:  Procedure Laterality Date   CESAREAN SECTION N/A 12/25/2014   Procedure: CESAREAN SECTION;  Surgeon: Shea Evans, MD;  Location: [facility];  Service: Obstetrics;  Laterality: N/A;   LAPAROSCOPY  03/24/2011   Procedure: LAPAROSCOPY DIAGNOSTIC;  Surgeon: Meriel Pica;  Location: [facility];  Service: Gynecology;  Laterality: N/A;   LASER ABLATION CONDOLAMATA N/A 06/16/2013   Procedure:  CO2 LASER ABLATION CONDOLAMATA;  Surgeon: Meriel Pica, MD;  Location: [facility];  Service: Gynecology;  Laterality: N/A;  CO2 LASER ABLATION OF VAIN & CIN   LEEP  08/11/2012   Procedure: LOOP ELECTROSURGICAL EXCISION PROCEDURE (LEEP);  Surgeon: Meriel Picaichard M Holland, MD;  Location: [facility];  Service: Gynecology;  Laterality: N/A;   WISDOM TOOTH EXTRACTION      Family History  Problem Relation Age of Onset   COPD Mother    Anxiety disorder Mother    Dementia Mother    Stroke Mother    Diabetes Father    Hypertension Father    Kidney disease Father     No Known Allergies  Current Outpatient Medications on File Prior to Visit  Medication Sig Dispense Refill   albuterol (VENTOLIN HFA) 108 (90 Base) MCG/ACT inhaler Inhale  into the lungs every 6 (six) hours as needed for wheezing or shortness of breath.     Continuous Blood Gluc Sensor (FREESTYLE LIBRE 3 SENSOR) MISC 1 Units by Does not apply route 4 (four) times daily. Place 1 sensor on the skin every 14 days. 6 each 0   etonogestrel (NEXPLANON) 68 MG IMPL implant 68 mg by Subdermal route once.     glucose blood (ONETOUCH VERIO) test strip Use as instructed to check blood sugar up to 3 times a day 100 each 5   hydrOXYzine (ATARAX) 10 MG tablet Take 1-2 tablets (10-20 mg total) by mouth at bedtime as needed. For anxiety and sleep. 90 tablet 1   Lancets (ONETOUCH DELICA PLUS LANCET33G) MISC Use as instructed to check blood sugar up to 3 times a day 100 each 5   metFORMIN (GLUCOPHAGE-XR) 500 MG 24 hr tablet TAKE 2 TABLETS (1,000 MG TOTAL) BY MOUTH DAILY WITH BREAKFAST. FOR DIABETES. 60 tablet 0   Multiple Vitamin (MULTIVITAMIN) capsule Take by mouth.     MYDAYIS 37.5 MG CP24 Take 1 capsule by mouth every morning.     ondansetron (ZOFRAN ODT) 4 MG disintegrating tablet 4mg  ODT q4 hours prn nausea/vomit 12 tablet 0   pantoprazole (PROTONIX) 20 MG tablet Take 1 tablet (20 mg total) by mouth daily. 15 tablet 0   rosuvastatin (CRESTOR) 5 MG tablet Take 1 tablet (5 mg total) by mouth daily. 90 tablet 3   valACYclovir (VALTREX) 500 MG tablet Take 500 mg by mouth daily as needed (for outbreaks).     Vitamin D, Ergocalciferol, (DRISDOL) 1.25 MG (50000 UNIT) CAPS capsule Take 1 capsule by mouth once weekly for 12 weeks. 12 capsule 0   dicyclomine (BENTYL) 20 MG tablet Take 20 mg by mouth every 6 (six) hours. Cramping (Patient not taking: Reported on 08/13/2021)     folic acid (FOLVITE) 1 MG tablet Take 1 mg by mouth daily. (Patient not taking: Reported on 08/13/2021)     No current facility-administered medications on file prior to visit.    BP 120/62    Pulse 97    Temp 98 F (36.7 C) (Temporal)    Ht 5\' 2"  (1.575 m)    Wt 177 lb (80.3 kg)    SpO2 98%    BMI 32.37 kg/m   Objective:   Physical Exam Constitutional:      Appearance: She is ill-appearing.  HENT:     Right Ear: Tympanic membrane and ear canal normal.     Left Ear: Tympanic membrane and ear canal normal.     Nose:     Right Sinus: No maxillary sinus tenderness or frontal sinus tenderness.     Left Sinus: No maxillary sinus tenderness or frontal sinus  tenderness.     Mouth/Throat:     Pharynx: No posterior oropharyngeal erythema.  Eyes:     Conjunctiva/sclera: Conjunctivae normal.  Cardiovascular:     Rate and Rhythm: Normal rate and regular rhythm.  Pulmonary:     Effort: Pulmonary effort is normal.     Breath sounds: Normal breath sounds. No wheezing or rales.     Comments: Dry, deep, bronchospasm like cough noted throughout visit Musculoskeletal:     Cervical back: Neck supple.  Lymphadenopathy:     Cervical: No cervical adenopathy.  Skin:    General: Skin is warm and dry.          Assessment & Plan:      This visit occurred during the SARS-CoV-2 public health emergency.  Safety protocols were in place, including screening questions prior to the visit, additional usage of staff PPE, and extensive cleaning of exam room while observing appropriate contact time as indicated for disinfecting solutions.

## 2021-08-13 NOTE — Patient Instructions (Addendum)
You may take Benzonatate capsules for cough. Take 1 capsule by mouth three times daily as needed for cough.  You may take the cough suppressant every 8 hours as needed for cough and rest. Caution this medication contains codeine which may cause drowsiness.   Complete xray(s) prior to leaving today. I will notify you of your results once received.  It was a pleasure to see you today!

## 2021-08-19 DIAGNOSIS — E119 Type 2 diabetes mellitus without complications: Secondary | ICD-10-CM

## 2021-08-19 MED ORDER — BLOOD GLUCOSE MONITOR KIT: PACK | 0 refills | Status: DC

## 2021-09-09 DIAGNOSIS — J4520 Mild intermittent asthma, uncomplicated: Secondary | ICD-10-CM

## 2021-09-09 MED ORDER — ALBUTEROL SULFATE HFA 108 (90 BASE) MCG/ACT IN AERS: 1.0000 | INHALATION_SPRAY | Freq: Four times a day (QID) | RESPIRATORY_TRACT | 0 refills | Status: AC | PRN

## 2021-09-10 NOTE — Telephone Encounter (Signed)
Will you see if she can do a virtual visit for 09/11/21?

## 2021-09-11 ENCOUNTER — Encounter: Payer: Self-pay | Admitting: Primary Care

## 2021-09-11 ENCOUNTER — Other Ambulatory Visit: Payer: Self-pay | Admitting: Primary Care

## 2021-09-11 ENCOUNTER — Telehealth (INDEPENDENT_AMBULATORY_CARE_PROVIDER_SITE_OTHER): Payer: 59 | Admitting: Primary Care

## 2021-09-11 VITALS — Ht 62.0 in | Wt 174.0 lb

## 2021-09-11 DIAGNOSIS — U071 COVID-19: Secondary | ICD-10-CM | POA: Diagnosis not present

## 2021-09-11 DIAGNOSIS — F411 Generalized anxiety disorder: Secondary | ICD-10-CM

## 2021-09-11 HISTORY — DX: COVID-19: U07.1

## 2021-09-11 MED ORDER — NIRMATRELVIR/RITONAVIR (PAXLOVID)TABLET: 3.0000 | ORAL_TABLET | Freq: Two times a day (BID) | ORAL | 0 refills | Status: AC

## 2021-09-11 MED ORDER — PREDNISONE 20 MG PO TABS: ORAL_TABLET | ORAL | 0 refills | Status: DC

## 2021-09-11 NOTE — Assessment & Plan Note (Signed)
Qualified for antiviral treatment given medical history and symptom onset. Discussed this as a treatment option, she agrees.   Rx for Paxlovid sent to pharmacy (GFR 99). Discussed instructions for use, potential side effects.  Rx for prednisone 20 mg to take 40 mg daily x 5 days sent to pharmacy.  She will pick up her albuterol inhaler.   Strict return precautions provided.  She does appear stable for outpatient treatment.

## 2021-09-11 NOTE — Progress Notes (Signed)
Patient ID: Brenda Valdez, female    DOB: 12/06/1985, 36 y.o.   MRN: [id number]  Virtual visit completed through [employer], a video enabled telemedicine application. Due to national recommendations of social distancing due to COVID-19, a virtual visit is felt to be most appropriate for this patient at this time. Reviewed limitations, risks, security and privacy concerns of performing a virtual visit and the availability of in person appointments. I also reviewed that there may be a patient responsible charge related to this service. The patient agreed to proceed.   Patient location: home Provider location: [place] [facility], office Persons participating in this virtual visit: patient, provider   If any vitals were documented, they were collected by patient at home unless specified below.    Ht 5' 2"  (1.575 m)    Wt 174 lb (78.9 kg)    BMI 31.83 kg/m    CC: Covid positive Subjective:   HPI: Brenda Valdez is a 36 y.o. female with a history of asthma, type 2 diabetes, hyperlipidemia, chiari I malformation, GAD presenting on 09/11/2021 to discuss Covid-19 infection.  Symptom onset three evenings ago, overall mild but progressed two days ago. Symptoms now include cough, chest congestion, body aches, chills, shortness of breath. She took a home Covid-19 test yesterday which was positive.   She's not used her albuterol inhaler as she's not been to the pharmacy. She's been taking Mucinex, Sudafed, Tylenol, Ibuprofen, cough syrup with codeine. Overall not much improvement.  She has mild chest tightness and mild SOB. Does have a history of asthma exacerbations. She has been quarantined at home for the last 2 days.      Relevant past medical, surgical, family and social history reviewed and updated as indicated. Interim medical history since our last visit reviewed. Allergies and medications reviewed and updated. Outpatient Medications Prior to Visit  Medication Sig Dispense Refill    albuterol (VENTOLIN HFA) 108 (90 Base) MCG/ACT inhaler Inhale 1-2 puffs into the lungs every 6 (six) hours as needed for wheezing or shortness of breath. 8 g 0   blood glucose meter kit and supplies KIT Dispense based on patient and insurance preference. Use up to four times daily as directed. (FOR ICD-9 250.00, 250.01). 1 each 0   Continuous Blood Gluc Sensor (FREESTYLE LIBRE 3 SENSOR) MISC 1 Units by Does not apply route 4 (four) times daily. Place 1 sensor on the skin every 14 days. 6 each 0   dicyclomine (BENTYL) 20 MG tablet Take 20 mg by mouth every 6 (six) hours. Cramping     etonogestrel (NEXPLANON) 68 MG IMPL implant 68 mg by Subdermal route once.     folic acid (FOLVITE) 1 MG tablet Take 1 mg by mouth daily.     glucose blood (ONETOUCH VERIO) test strip Use as instructed to check blood sugar up to 3 times a day 100 each 5   hydrOXYzine (ATARAX) 10 MG tablet Take 1-2 tablets (10-20 mg total) by mouth at bedtime as needed. For anxiety and sleep. 90 tablet 1   Lancets (ONETOUCH DELICA PLUS [id number]) MISC Use as instructed to check blood sugar up to 3 times a day 100 each 5   metFORMIN (GLUCOPHAGE-XR) 500 MG 24 hr tablet TAKE 2 TABLETS (1,000 MG TOTAL) BY MOUTH DAILY WITH BREAKFAST. FOR DIABETES. 60 tablet 0   Multiple Vitamin (MULTIVITAMIN) capsule Take by mouth.     MYDAYIS 37.5 MG CP24 Take 1 capsule by mouth every morning.  ondansetron (ZOFRAN ODT) 4 MG disintegrating tablet 16m ODT q4 hours prn nausea/vomit 12 tablet 0   pantoprazole (PROTONIX) 20 MG tablet Take 1 tablet (20 mg total) by mouth daily. 15 tablet 0   rosuvastatin (CRESTOR) 5 MG tablet Take 1 tablet (5 mg total) by mouth daily. 90 tablet 3   valACYclovir (VALTREX) 500 MG tablet Take 500 mg by mouth daily as needed (for outbreaks).     Vitamin D, Ergocalciferol, (DRISDOL) 1.25 MG (50000 UNIT) CAPS capsule Take 1 capsule by mouth once weekly for 12 weeks. 12 capsule 0   benzonatate (TESSALON) 200 MG capsule Take 1 capsule  (200 mg total) by mouth 3 (three) times daily as needed for cough. 30 capsule 0   guaiFENesin-codeine (ROBITUSSIN AC) 100-10 MG/5ML syrup Take 5 mLs by mouth 3 (three) times daily as needed for cough. 75 mL 0   predniSONE (DELTASONE) 20 MG tablet Take 2 tablets by mouth once daily for 5 days. 10 tablet 0   No facility-administered medications prior to visit.     Per HPI unless specifically indicated in ROS section below Review of Systems  Constitutional:  Positive for chills and fatigue.  HENT:  Positive for congestion.   Respiratory:  Positive for cough, chest tightness and shortness of breath. Negative for wheezing.   Musculoskeletal:  Positive for myalgias.  Objective:  Ht 5' 2"  (1.575 m)    Wt 174 lb (78.9 kg)    BMI 31.83 kg/m   Wt Readings from Last 3 Encounters:  09/11/21 174 lb (78.9 kg)  08/13/21 177 lb (80.3 kg)  08/07/21 178 lb (80.7 kg)       Physical exam: General: Alert and oriented x 3, no distress, does appear sickly.  Pulmonary: Speaks in complete sentences without increased work of breathing, dry cough noted once during visit.  Psychiatric: Normal mood, thought content, and behavior.     Results for orders placed or performed in visit on 08/13/21  POC COVID-19  Result Value Ref Range   SARS Coronavirus 2 Ag Negative Negative  Influenza A/B  Result Value Ref Range   Influenza A, POC Negative Negative   Influenza B, POC Negative Negative   Assessment & Plan:   Problem List Items Addressed This Visit       Other   COVID-19 virus infection - Primary    Qualified for antiviral treatment given medical history and symptom onset. Discussed this as a treatment option, she agrees.   Rx for Paxlovid sent to pharmacy (GFR 99). Discussed instructions for use, potential side effects.  Rx for prednisone 20 mg to take 40 mg daily x 5 days sent to pharmacy.  She will pick up her albuterol inhaler.   Strict return precautions provided.  She does appear stable  for outpatient treatment.      Relevant Medications   predniSONE (DELTASONE) 20 MG tablet   nirmatrelvir/ritonavir EUA (PAXLOVID) 20 x 150 MG & 10 x 100MG TABS     Meds ordered this encounter  Medications   predniSONE (DELTASONE) 20 MG tablet    Sig: Take 2 tablets by mouth once daily for 5 days.    Dispense:  10 tablet    Refill:  0    Order Specific Question:   Supervising Provider    Answer:   BEDSOLE, AMY E [[id number]]   nirmatrelvir/ritonavir EUA (PAXLOVID) 20 x 150 MG & 10 x 100MG TABS    Sig: Take 3 tablets by mouth 2 (two) times daily for 5  days. Patient GFR is 99    Dispense:  30 tablet    Refill:  0    Order Specific Question:   Supervising Provider    Answer:   BEDSOLE, AMY E [[id number]]   No orders of the defined types were placed in this encounter.   I discussed the assessment and treatment plan with the patient. The patient was provided an opportunity to ask questions and all were answered. The patient agreed with the plan and demonstrated an understanding of the instructions. The patient was advised to call back or seek an in-person evaluation if the symptoms worsen or if the condition fails to improve as anticipated.  Follow up plan:  Start Paxlovid antiviral treatment. Take 3 capsules by mouth twice daily. Follow package instructions.  Start prednisone 20 mg tablets. Take 2 tablets by mouth once daily for 5 days.  Use your albuterol inhaler as needed.  Please update me if no improvement!  It was a pleasure to see you today!   Pleas Koch, NP

## 2021-09-11 NOTE — Patient Instructions (Signed)
°  Start Paxlovid antiviral treatment. Take 3 capsules by mouth twice daily. Follow package instructions.  Start prednisone 20 mg tablets. Take 2 tablets by mouth once daily for 5 days.  Use your albuterol inhaler as needed.  Please update me if no improvement!  It was a pleasure to see you today!

## 2021-09-16 ENCOUNTER — Other Ambulatory Visit: Payer: Self-pay | Admitting: Primary Care

## 2021-09-16 DIAGNOSIS — E119 Type 2 diabetes mellitus without complications: Secondary | ICD-10-CM

## 2021-09-16 NOTE — Telephone Encounter (Signed)
Aurielle called in and wanted to speak with Brenda Valdez. But I got her scheduled for 2/17 @1120 

## 2021-09-16 NOTE — Telephone Encounter (Signed)
Do you want me to call and make appointment the first thing you have open is on Friday

## 2021-09-19 ENCOUNTER — Ambulatory Visit: Payer: 59 | Admitting: Primary Care

## 2021-09-19 ENCOUNTER — Encounter: Payer: Self-pay | Admitting: Primary Care

## 2021-09-19 ENCOUNTER — Other Ambulatory Visit: Payer: Self-pay

## 2021-09-19 VITALS — BP 120/62 | HR 91 | Temp 98.6°F | Ht 62.0 in | Wt 177.0 lb

## 2021-09-19 DIAGNOSIS — E119 Type 2 diabetes mellitus without complications: Secondary | ICD-10-CM

## 2021-09-19 DIAGNOSIS — R21 Rash and other nonspecific skin eruption: Secondary | ICD-10-CM | POA: Insufficient documentation

## 2021-09-19 DIAGNOSIS — E1165 Type 2 diabetes mellitus with hyperglycemia: Secondary | ICD-10-CM | POA: Diagnosis not present

## 2021-09-19 DIAGNOSIS — U071 COVID-19: Secondary | ICD-10-CM | POA: Diagnosis not present

## 2021-09-19 MED ORDER — FREESTYLE LIBRE 3 SENSOR MISC: 1.0000 [IU] | Freq: Four times a day (QID) | 3 refills | Status: DC

## 2021-09-19 MED ORDER — GLIPIZIDE ER 5 MG PO TB24: 5.0000 mg | ORAL_TABLET | Freq: Every day | ORAL | 1 refills | Status: DC

## 2021-09-19 MED ORDER — METFORMIN HCL ER 500 MG PO TB24: ORAL_TABLET | ORAL | 1 refills | Status: DC

## 2021-09-19 MED ORDER — KETOCONAZOLE 2 % EX CREA: 1.0000 "application " | TOPICAL_CREAM | Freq: Every day | CUTANEOUS | 0 refills | Status: DC

## 2021-09-19 NOTE — Assessment & Plan Note (Signed)
Uncontrolled with glucose readings in the 300s. A1c of 9.7 in early January 2023.  Increase metformin XR to 1500 mg daily, 2 tabs in the a.m. and 1 tab in the p.m.  Add glipizide 5 mg XL every morning.  She declines nutrition referral as this was ineffective previously.  She will continue to monitor glucose levels, refill sent for freestyle libre 3.  Follow-up in early April, but she will notify sooner if she does not notice a reduction in her glucose levels.  If this is the case then consider dose increase of her glipizide to 10 mg

## 2021-09-19 NOTE — Assessment & Plan Note (Signed)
Most symptoms resolved with the exception of a lingering cough and congestion.  Lungs are clear today which is reassuring Fortunately she is improving.  Consider chest x-ray if symptoms do not continue to improve

## 2021-09-19 NOTE — Assessment & Plan Note (Signed)
Unclear etiology as I do not see a rash or drainage on exam today.  Nystatin-triamcinolone with some improvement but without resolve.  Prescription for ketoconazole 2% cream sent to pharmacy for her to use daily as needed.  I did recommend that she get set up with a dermatologist for a formal diagnosis of her rash.

## 2021-09-19 NOTE — Patient Instructions (Signed)
Start glipizide XL 5 mg one daily for diabetes.   We increased your metformin XR to 1500 mg. Take 2 tablets by mouth every morning and 1 tablet by mouth every evening for diabetes.  You can apply the ketoconazole cream daily as needed.  Please establish with a dermatologist.  Schedule a follow-up visit with with me for early April.  Keep me updated!

## 2021-09-19 NOTE — Progress Notes (Signed)
Subjective:    Patient ID: Brenda Valdez, female    DOB: 1986-03-09, 36 y.o.   MRN: [id number]  HPI  Brenda Valdez is a very pleasant 36 y.o. female with a history of type 2 diabetes, asthma, hyperlipidemia, GAD, influenza, COVID-19 infection who presents today to discuss glucose concerns. She would also like to mention skin irritation.   1) Type 2 Diabetes:  Diagnosed with COVID-19 infection 2 weeks ago, treated with antiviral medication and prednisone.  She was also treated with prednisone in mid January 2023 for an asthma flare secondary to influenza.  Today she's feeling better from Covi-19 infection. She completed Paxlovid and prednisone. She does continue with nasal drainage, congested cough  She's checking her glucose readings through her [employer]. She is compliant to her metformin XR 1000 mg daily and hasn't noticed a reduction in sugars. Glucose readings are ranging in the mid to high 300's, some readings are "high" without a number. She has seen a nutritionist in the past, didn't find it effective.   2) Skin Irritation: Chronic to the umbilicus with itching and irritation, hyperpigmentation to skin. She suspects her skin irritation was fungal/yeast because of the smell, has noticed a correlation with increased irritation with elevated glucose levels. She was treated previously with an antifungal cream with temporary improvement. She's tried OTC cortisone minimal improvement.  She is using Nystatin and triamcinolone combination cream now.   Review of Systems  Constitutional:  Positive for diaphoresis. Negative for fever.  HENT:  Positive for congestion and rhinorrhea.   Respiratory:  Positive for cough.   Cardiovascular:  Negative for chest pain.  Endocrine: Positive for polyuria.  Neurological:  Positive for numbness.        Past Medical History:  Diagnosis Date   ADHD    Asthma    excercise induced   Chiari I malformation (HCC)    Decreased fetal movement  during pregnancy in third trimester, antepartum 12/05/2014   Decreased fetal movement in pregnancy in third trimester, antepartum 12/23/2014   Diabetes mellitus without complication (HCC)    Hyperlipidemia    IBS (irritable bowel syndrome)    Internal hemorrhoids    noted on colonoscopy from 2020   No pertinent past medical history    Vaginal Pap smear, abnormal     Social History   Socioeconomic History   Marital status: Single    Spouse name: Not on file   Number of children: 1   Years of education: BA   Highest education level: Not on file  Occupational History   Occupation: [employer]  Tobacco Use   Smoking status: Never   Smokeless tobacco: Never  Vaping Use   Vaping Use: Never used  Substance and Sexual Activity   Alcohol use: No    Comment: socially   Drug use: No   Sexual activity: Yes    Birth control/protection: None  Other Topics Concern   Not on file  Social History Narrative   Lives with mother and daughter   Caffeine use: Drinks 1/2 cup per day   Right handed    Social Determinants of Health   Financial Resource Strain: Not on file  Food Insecurity: Not on file  Transportation Needs: Not on file  Physical Activity: Not on file  Stress: Not on file  Social Connections: Not on file  Intimate Partner Violence: Not on file    Past Surgical History:  Procedure Laterality Date   CESAREAN SECTION N/A 12/25/2014  Procedure: CESAREAN SECTION;  Surgeon: Azucena Fallen, MD;  Location: [facility];  Service: Obstetrics;  Laterality: N/A;   LAPAROSCOPY  03/24/2011   Procedure: LAPAROSCOPY DIAGNOSTIC;  Surgeon: Margarette Asal;  Location: [facility];  Service: Gynecology;  Laterality: N/A;   LASER ABLATION CONDOLAMATA N/A 06/16/2013   Procedure:  CO2 LASER ABLATION CONDOLAMATA;  Surgeon: Margarette Asal, MD;  Location: [facility];  Service: Gynecology;  Laterality: N/A;  CO2 LASER ABLATION OF VAIN & CIN   LEEP  08/11/2012   Procedure: LOOP ELECTROSURGICAL  EXCISION PROCEDURE (LEEP);  Surgeon: Margarette Asal, MD;  Location: [facility];  Service: Gynecology;  Laterality: N/A;   WISDOM TOOTH EXTRACTION      Family History  Problem Relation Age of Onset   COPD Mother    Anxiety disorder Mother    Dementia Mother    Stroke Mother    Diabetes Father    Hypertension Father    Kidney disease Father     No Known Allergies  Current Outpatient Medications on File Prior to Visit  Medication Sig Dispense Refill   albuterol (VENTOLIN HFA) 108 (90 Base) MCG/ACT inhaler Inhale 1-2 puffs into the lungs every 6 (six) hours as needed for wheezing or shortness of breath. 8 g 0   blood glucose meter kit and supplies KIT Dispense based on patient and insurance preference. Use up to four times daily as directed. (FOR ICD-9 250.00, 250.01). 1 each 0   dicyclomine (BENTYL) 20 MG tablet Take 20 mg by mouth every 6 (six) hours. Cramping     etonogestrel (NEXPLANON) 68 MG IMPL implant 68 mg by Subdermal route once.     folic acid (FOLVITE) 1 MG tablet Take 1 mg by mouth daily.     glucose blood (ONETOUCH VERIO) test strip USE UP TO 4 TIMES A DAY AS DIRECTED 300 strip 3   hydrOXYzine (ATARAX) 10 MG tablet Take 1-2 tablets (10-20 mg total) by mouth at bedtime as needed. For anxiety and sleep. 90 tablet 1   Lancets (ONETOUCH DELICA PLUS [id number]) MISC Use as instructed to check blood sugar up to 3 times a day 100 each 5   Multiple Vitamin (MULTIVITAMIN) capsule Take by mouth.     MYDAYIS 37.5 MG CP24 Take 1 capsule by mouth every morning.     ondansetron (ZOFRAN ODT) 4 MG disintegrating tablet 52m ODT q4 hours prn nausea/vomit 12 tablet 0   pantoprazole (PROTONIX) 20 MG tablet Take 1 tablet (20 mg total) by mouth daily. 15 tablet 0   predniSONE (DELTASONE) 20 MG tablet Take 2 tablets by mouth once daily for 5 days. 10 tablet 0   rosuvastatin (CRESTOR) 5 MG tablet Take 1 tablet (5 mg total) by mouth daily. 90 tablet 3   valACYclovir (VALTREX) 500 MG tablet Take 500  mg by mouth daily as needed (for outbreaks).     Vitamin D, Ergocalciferol, (DRISDOL) 1.25 MG (50000 UNIT) CAPS capsule Take 1 capsule by mouth once weekly for 12 weeks. 12 capsule 0   No current facility-administered medications on file prior to visit.    BP 120/62    Pulse 91    Temp 98.6 F (37 C) (Temporal)    Ht 5' 2"  (1.575 m)    Wt 177 lb (80.3 kg)    SpO2 97%    BMI 32.37 kg/m  Objective:   Physical Exam Cardiovascular:     Rate and Rhythm: Normal rate and regular rhythm.  Pulmonary:     Effort:  Pulmonary effort is normal.     Breath sounds: Normal breath sounds. No wheezing.     Comments: Dry cough noted several times during visit Musculoskeletal:     Cervical back: Neck supple.  Skin:    General: Skin is warm and dry.     Comments: Hyperpigmentation to umbilicus without drainage or erythema          Assessment & Plan:      This visit occurred during the SARS-CoV-2 public health emergency.  Safety protocols were in place, including screening questions prior to the visit, additional usage of staff PPE, and extensive cleaning of exam room while observing appropriate contact time as indicated for disinfecting solutions.

## 2021-10-03 ENCOUNTER — Other Ambulatory Visit: Payer: Self-pay | Admitting: Primary Care

## 2021-10-03 DIAGNOSIS — J4520 Mild intermittent asthma, uncomplicated: Secondary | ICD-10-CM

## 2021-10-11 ENCOUNTER — Other Ambulatory Visit: Payer: Self-pay | Admitting: Primary Care

## 2021-10-11 DIAGNOSIS — F411 Generalized anxiety disorder: Secondary | ICD-10-CM

## 2021-11-06 ENCOUNTER — Encounter: Payer: Self-pay | Admitting: Primary Care

## 2021-11-06 ENCOUNTER — Ambulatory Visit: Payer: 59 | Admitting: Primary Care

## 2021-11-06 VITALS — BP 108/68 | HR 83 | Ht 62.0 in | Wt 175.8 lb

## 2021-11-06 DIAGNOSIS — M5442 Lumbago with sciatica, left side: Secondary | ICD-10-CM | POA: Diagnosis not present

## 2021-11-06 DIAGNOSIS — G8929 Other chronic pain: Secondary | ICD-10-CM | POA: Diagnosis not present

## 2021-11-06 DIAGNOSIS — E559 Vitamin D deficiency, unspecified: Secondary | ICD-10-CM

## 2021-11-06 DIAGNOSIS — E1165 Type 2 diabetes mellitus with hyperglycemia: Secondary | ICD-10-CM | POA: Diagnosis not present

## 2021-11-06 LAB — BASIC METABOLIC PANEL
BUN: 20 mg/dL (ref 6–23)
CO2: 23 mEq/L (ref 19–32)
Calcium: 9.2 mg/dL (ref 8.4–10.5)
Chloride: 104 mEq/L (ref 96–112)
Creatinine, Ser: 0.77 mg/dL (ref 0.40–1.20)
GFR: 100.54 mL/min (ref >60.00)
Glucose, Bld: 204 mg/dL — ABNORMAL HIGH (ref 70–99)
Potassium: 4.5 mEq/L (ref 3.5–5.1)
Sodium: 135 mEq/L (ref 135–145)

## 2021-11-06 LAB — MICROALBUMIN / CREATININE URINE RATIO
Creatinine,U: 184.1 mg/dL
Microalb Creat Ratio: 0.4 mg/g (ref 0.0–30.0)
Microalb, Ur: 0.7 mg/dL (ref 0.0–1.9)

## 2021-11-06 LAB — VITAMIN D 25 HYDROXY (VIT D DEFICIENCY, FRACTURES): VITD: 14.2 ng/mL — ABNORMAL LOW (ref 30.00–100.00)

## 2021-11-06 LAB — POCT GLYCOSYLATED HEMOGLOBIN (HGB A1C): Hemoglobin A1C: 7.4 % — AB (ref 4.0–5.6)

## 2021-11-06 MED ORDER — CYCLOBENZAPRINE HCL 5 MG PO TABS: 5.0000 mg | ORAL_TABLET | Freq: Three times a day (TID) | ORAL | 0 refills | Status: DC | PRN

## 2021-11-06 NOTE — Assessment & Plan Note (Signed)
Chronic, intermittent. ? ?Agree to provide refill of cyclobenzaprine 5 mg to use PRN. ?

## 2021-11-06 NOTE — Assessment & Plan Note (Signed)
Improved with A1C today of 7.4.  ? ?Discussed further treatment by increasing metformin XR to 2000 mg daily versus lifestyle improvements.  She opts for lifestyle appointments and will notify if glucose readings remain elevated. ? ?Would not increase glipizide given the few hypoglycemic events.  Would avoid GLP-1 agonist given chronic GI symptoms. ? ?Continue metformin XR 1500 mg daily, glipizide XL 5 mg daily ? ?Follow up in 3 months.  ?

## 2021-11-06 NOTE — Patient Instructions (Signed)
Stop by the lab prior to leaving today. I will notify you of your results once received.   Please schedule a follow up visit for 3 months.  It was a pleasure to see you today!  

## 2021-11-06 NOTE — Addendum Note (Signed)
Addended by: Pleas Koch on: 11/06/2021 08:19 AM ? ? Modules accepted: Orders ? ?

## 2021-11-06 NOTE — Progress Notes (Addendum)
? ?Subjective:  ? ? Patient ID: Brenda Valdez, female    DOB: [birth year], 35 y.o.   MRN: [id number] ? ?HPI ? ?Brenda Valdez is a very pleasant 36 y.o. female with a history of type 2 diabetes, hyperlipidemia, asthma, GAD who presents today for follow-up of diabetes. She is also due for vitamin D recheck. She would also like to discuss chronic back pain. ? ?1) Type 2 Diabetes: ? ?Current medications include: Metformin XR 1500 mg daily, Glipizide XL 5 mg daily.  ? ?She is checking her blood glucose continuously with FreeStyle Libre which is ranging high 100's to mid 200's. She is in range 47% of time. She has dropped a few times in the 50's and 60's, once during the day and a few times in the evening.  ? ?She noticed polyuria, polydipsia, and polyphagia during higher readings.  ? ?Last A1C: 9.7 in January 2023, 7.4 today ?Last Eye Exam: Completed in October 2023 ?Last Foot Exam: Due ?Pneumonia Vaccination: Never completed, declines  ?Urine Microalbumin: Due ?Statin: Rosuvastatin ? ?Dietary changes since last visit: She has increased water intake. She limits fried/fatty food. ? ? ?Exercise: No regular exercise.  ? ?2) Chronic Back Pain: Chronic left sided back and buttocks pain with radiation down her left lower extremity. Intermittent for years. 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 She mostly treats with conservative treatment. Currently managed on cyclobenzaprine for which she takes as needed, mostly twice monthly. She is needing a refill of her cyclobenzaprine now.   ? ?BP Readings from Last 3 Encounters:  ?11/06/21 108/68  ?09/19/21 120/62  ?08/13/21 120/62  ? ? ? ?Review of Systems  ?Cardiovascular:  Negative for chest pain.  ?Endocrine: Positive for polydipsia, polyphagia and polyuria.  ?Musculoskeletal:  Positive for arthralgias and back pain.  ?Neurological:  Positive for numbness.  ? ?   ? ? ?Past Medical History:  ?Diagnosis Date  ? ADHD   ? Asthma   ? excercise induced  ?  Chiari I malformation ([place])   ? COVID-19 virus infection 09/11/2021  ? Decreased fetal movement during pregnancy in third trimester, antepartum 12/05/2014  ? Decreased fetal movement in pregnancy in third trimester, antepartum 12/23/2014  ? Diabetes mellitus without complication ([place])   ? Hyperlipidemia   ? IBS (irritable bowel syndrome)   ? Internal hemorrhoids   ? noted on colonoscopy from 2020  ? No pertinent past medical history   ? Vaginal Pap smear, abnormal   ? ? ?Social History  ? ?Socioeconomic History  ? Marital status: Single  ?  Spouse name: Not on file  ? Number of children: 1  ? Years of education: BA  ? Highest education level: Not on file  ?Occupational History  ? 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[employer]  ?Tobacco Use  ? Smoking status: Never  ? Smokeless tobacco: Never  ?Vaping Use  ? Vaping Use: Never used  ?Substance and Sexual Activity  ? Alcohol use: No  ?  Comment: socially  ? Drug use: No  ? Sexual activity: Yes  ?  Birth control/protection: None  ?Other Topics Concern  ? Not on file  ?Social History Narrative  ? Lives with mother and daughter  ? Caffeine use: Drinks 1/2 cup per day  ? Right handed   ? ?Social Determinants of Health  ? ?Financial Resource Strain: Not on file  ?Food Insecurity: Not on file  ?Transportation Needs: Not on file  ?Physical Activity: Not on file  ?  Stress: Not on file  ?Social Connections: Not on file  ?Intimate Partner Violence: Not on file  ? ? ?Past Surgical History:  ?Procedure Laterality Date  ? CESAREAN SECTION N/A 12/25/2014  ? Procedure: CESAREAN SECTION;  Surgeon: Azucena Fallen, MD;  Location: [facility];  Service: Obstetrics;  Laterality: N/A;  ? LAPAROSCOPY  03/24/2011  ? Procedure: LAPAROSCOPY DIAGNOSTIC;  Surgeon: Margarette Asal;  Location: [facility];  Service: Gynecology;  Laterality: N/A;  ? LASER ABLATION CONDOLAMATA N/A 06/16/2013  ? Procedure:  CO2 LASER ABLATION CONDOLAMATA;  Surgeon: Margarette Asal, MD;  Location: [facility];  Service: Gynecology;  Laterality:  N/A;  CO2 LASER ABLATION OF VAIN & CIN  ? LEEP  08/11/2012  ? Procedure: LOOP ELECTROSURGICAL EXCISION PROCEDURE (LEEP);  Surgeon: Margarette Asal, MD;  Location: [facility];  Service: Gynecology;  Laterality: N/A;  ? WISDOM TOOTH EXTRACTION    ? ? ?Family History  ?Problem Relation Age of Onset  ? COPD Mother   ? Anxiety disorder Mother   ? Dementia Mother   ? Stroke Mother   ? Diabetes Father   ? Hypertension Father   ? Kidney disease Father   ? ? ?No Known Allergies ? ?Current Outpatient Medications on File Prior to Visit  ?Medication Sig Dispense Refill  ? albuterol (VENTOLIN HFA) 108 (90 Base) MCG/ACT inhaler Inhale 1-2 puffs into the lungs every 6 (six) hours as needed for wheezing or shortness of breath. 8 g 0  ? blood glucose meter kit and supplies KIT Dispense based on patient and insurance preference. Use up to four times daily as directed. (FOR ICD-9 250.00, 250.01). 1 each 0  ? Continuous Blood Gluc Sensor (FREESTYLE LIBRE 3 SENSOR) MISC 1 Units by Does not apply route 4 (four) times daily. Place 1 sensor on the skin every 14 days. 6 each 3  ? dicyclomine (BENTYL) 20 MG tablet Take 20 mg by mouth every 6 (six) hours. Cramping    ? etonogestrel (NEXPLANON) 68 MG IMPL implant 68 mg by Subdermal route once.    ? folic acid (FOLVITE) 1 MG tablet Take 1 mg by mouth daily.    ? glipiZIDE (GLUCOTROL XL) 5 MG 24 hr tablet Take 1 tablet (5 mg total) by mouth daily with breakfast. For diabetes. 90 tablet 1  ? glucose blood (ONETOUCH VERIO) test strip USE UP TO 4 TIMES A DAY AS DIRECTED 300 strip 3  ? hydrOXYzine (ATARAX) 10 MG tablet TAKE 1-2 TABLETS (10-20 MG TOTAL) BY MOUTH AT BEDTIME AS NEEDED. FOR ANXIETY AND SLEEP. 180 tablet 1  ? ketoconazole (NIZORAL) 2 % cream Apply 1 application topically daily. As needed. 30 g 0  ? Lancets (ONETOUCH DELICA PLUS [id number]) MISC Use as instructed to check blood sugar up to 3 times a day 100 each 5  ? metFORMIN (GLUCOPHAGE-XR) 500 MG 24 hr tablet Take 2 tablets by mouth every  morning and 1 tablet at night for diabetes. 270 tablet 1  ? Multiple Vitamin (MULTIVITAMIN) capsule Take by mouth.    ? MYDAYIS 37.5 MG CP24 Take 1 capsule by mouth every morning.    ? ondansetron (ZOFRAN ODT) 4 MG disintegrating tablet 21m ODT q4 hours prn nausea/vomit 12 tablet 0  ? pantoprazole (PROTONIX) 20 MG tablet Take 1 tablet (20 mg total) by mouth daily. 15 tablet 0  ? rosuvastatin (CRESTOR) 5 MG tablet Take 1 tablet (5 mg total) by mouth daily. 90 tablet 3  ? valACYclovir (VALTREX) 500 MG tablet Take 500  mg by mouth daily as needed (for outbreaks).    ? Vitamin D, Ergocalciferol, (DRISDOL) 1.25 MG (50000 UNIT) CAPS capsule Take 1 capsule by mouth once weekly for 12 weeks. 12 capsule 0  ? ?No current facility-administered medications on file prior to visit.  ? ? ?BP 108/68   Pulse 83   Ht _0  (1.575 m)   Wt 175 lb 12.8 oz (79.7 kg)   SpO2 97%   BMI 32.15 kg/m?  ?Objective:  ? Physical Exam ?Cardiovascular:  ?   Rate and Rhythm: Normal rate and regular rhythm.  ?Pulmonary:  ?   Effort: Pulmonary effort is normal.  ?   Breath sounds: Normal breath sounds.  ?Musculoskeletal:     ?   General: Normal range of motion.  ?   Cervical back: Neck supple.  ?Skin: ?   General: Skin is warm and dry.  ? ? ? ? ? ?   ?Assessment & Plan:  ? ? ? ? ?This visit occurred during the SARS-CoV-2 public health emergency.  Safety protocols were in place, including screening questions prior to the visit, additional usage of staff PPE, and extensive cleaning of exam room while observing appropriate contact time as indicated for disinfecting solutions.  ?

## 2021-11-06 NOTE — Assessment & Plan Note (Signed)
Inconsistent use of vitamin D, recently began 2000 IUs daily. ? ?Repeat vitamin level pending per patient request. ?

## 2021-11-07 MED ORDER — VITAMIN D (ERGOCALCIFEROL) 1.25 MG (50000 UNIT) PO CAPS: ORAL_CAPSULE | ORAL | 0 refills | Status: DC

## 2022-01-07 ENCOUNTER — Telehealth: Payer: 59 | Admitting: Primary Care

## 2022-01-14 NOTE — Telephone Encounter (Signed)
Appointment set up for patient. She called before this was received.

## 2022-01-18 ENCOUNTER — Other Ambulatory Visit: Payer: Self-pay | Admitting: Primary Care

## 2022-01-18 DIAGNOSIS — E559 Vitamin D deficiency, unspecified: Secondary | ICD-10-CM

## 2022-01-19 ENCOUNTER — Ambulatory Visit: Payer: 59 | Admitting: Family Medicine

## 2022-01-19 ENCOUNTER — Encounter: Payer: Self-pay | Admitting: Family Medicine

## 2022-01-19 VITALS — BP 100/60 | HR 77 | Temp 98.2°F | Ht 62.0 in | Wt 186.1 lb

## 2022-01-19 DIAGNOSIS — E669 Obesity, unspecified: Secondary | ICD-10-CM

## 2022-01-19 DIAGNOSIS — E1165 Type 2 diabetes mellitus with hyperglycemia: Secondary | ICD-10-CM

## 2022-01-19 MED ORDER — OZEMPIC (0.25 OR 0.5 MG/DOSE) 2 MG/3ML ~~LOC~~ SOPN
0.2500 mg | PEN_INJECTOR | SUBCUTANEOUS | 2 refills | Status: DC
Start: 2022-01-19 — End: 2022-03-17

## 2022-01-19 NOTE — Progress Notes (Signed)
Brenda Valdez T. Brenda Tino, MD, [place] at [facility], [address]  Phone: [phone]  FAX: [phone]  Brenda Valdez - 36 y.o. female  MRN [id number]  Date of Birth: [birth year]  Date: 01/19/2022  PCP: Pleas Koch, NP  Referral: Pleas Koch, NP  Chief Complaint  Patient presents with   Diabetes    Wants to get of Metformin-Causing itching   Subjective:   Brenda Valdez is a 36 y.o. very pleasant female patient with Body mass index is 34.04 kg/m. who presents with the following:  DM:  change, issues with Metformin.  Metformin - has had some itchy patches.  Went out of town, and rash came back.  Glipizide 5 mg daily. Was on 3 tabs of Metformin 500 mg  Lab Results  Component Value Date   HGBA1C 7.4 (A) 11/06/2021    Trial of Ozempic - she wanted to try for additional weight loss  Review of Systems is noted in the HPI, as appropriate  Objective:   BP 100/60   Pulse 77   Temp 98.2 F (36.8 C) (Oral)   Ht _0  (1.575 m)   Wt 186 lb 2 oz (84.4 kg)   SpO2 97%   BMI 34.04 kg/m   GEN: No acute distress; alert,appropriate. PULM: Breathing comfortably in no respiratory distress PSYCH: Normally interactive.   Laboratory and Imaging Data:  Assessment and Plan:     ICD-10-CM   1. Type 2 diabetes mellitus with hyperglycemia, without long-term current use of insulin (HCC)  E11.65     2. Obesity (BMI 30.0-34.9)  E66.9      Unstable diabetes and intolerance to Metformin. (Rash)  Goal of additional weight loss.  Failure Metformin, currently on Glipizide.  Medication Management during today's office visit: Meds ordered this encounter  Medications   Semaglutide,0.25 or 0.5MG/DOS, (OZEMPIC, 0.25 OR 0.5 MG/DOSE,) 2 MG/3ML SOPN    Sig: Inject 0.25 mg into the skin once a week. After 1 month, increase to 0.5 mg each week    Dispense:  3 mL    Refill:  2    Follow-up if needed: Return  in about 3 months (around 04/21/2022) for Dr. Allie Bossier for diabetes follow-up.  Dragon [employer] One speech-to-text software was used for transcription in this dictation.  Possible transcriptional errors can occur using Editor, commissioning.   Signed,  Maud Deed. Zakiah Gauthreaux, MD   Outpatient Encounter Medications as of 01/19/2022  Medication Sig   albuterol (VENTOLIN HFA) 108 (90 Base) MCG/ACT inhaler Inhale 1-2 puffs into the lungs every 6 (six) hours as needed for wheezing or shortness of breath.   Continuous Blood Gluc Sensor (FREESTYLE LIBRE 3 SENSOR) MISC 1 Units by Does not apply route 4 (four) times daily. Place 1 sensor on the skin every 14 days.   cyclobenzaprine (FLEXERIL) 5 MG tablet Take 1 tablet (5 mg total) by mouth 3 (three) times daily as needed for muscle spasms.   dicyclomine (BENTYL) 20 MG tablet Take 20 mg by mouth every 6 (six) hours as needed for spasms.   etonogestrel (NEXPLANON) 68 MG IMPL implant 68 mg by Subdermal route once.   folic acid (FOLVITE) 1 MG tablet Take 1 mg by mouth daily.   glipiZIDE (GLUCOTROL XL) 5 MG 24 hr tablet Take 1 tablet (5 mg total) by mouth daily with breakfast. For diabetes.   hydrOXYzine (ATARAX) 10 MG tablet TAKE 1-2 TABLETS (10-20 MG TOTAL) BY  MOUTH AT BEDTIME AS NEEDED. FOR ANXIETY AND SLEEP.   Multiple Vitamin (MULTIVITAMIN) capsule Take by mouth.   MYDAYIS 37.5 MG CP24 Take 1 capsule by mouth every morning.   ondansetron (ZOFRAN ODT) 4 MG disintegrating tablet 25m ODT q4 hours prn nausea/vomit   pantoprazole (PROTONIX) 20 MG tablet Take 1 tablet (20 mg total) by mouth daily.   rosuvastatin (CRESTOR) 5 MG tablet Take 1 tablet (5 mg total) by mouth daily.   Semaglutide,0.25 or 0.5MG/DOS, (OZEMPIC, 0.25 OR 0.5 MG/DOSE,) 2 MG/3ML SOPN Inject 0.25 mg into the skin once a week. After 1 month, increase to 0.5 mg each week   valACYclovir (VALTREX) 500 MG tablet Take 500 mg by mouth daily as needed (for outbreaks).   Vitamin D, Ergocalciferol, (DRISDOL)  1.25 MG (50000 UNIT) CAPS capsule Take 1 capsule by mouth once weekly for 12 weeks.   [DISCONTINUED] dicyclomine (BENTYL) 20 MG tablet Take 20 mg by mouth every 6 (six) hours. Cramping   [DISCONTINUED] glucose blood (ONETOUCH VERIO) test strip USE UP TO 4 TIMES A DAY AS DIRECTED   [DISCONTINUED] Lancets (ONETOUCH DELICA PLUS [id number] MISC Use as instructed to check blood sugar up to 3 times a day   [DISCONTINUED] metFORMIN (GLUCOPHAGE-XR) 500 MG 24 hr tablet Take 2 tablets by mouth every morning and 1 tablet at night for diabetes.   [DISCONTINUED] blood glucose meter kit and supplies KIT Dispense based on patient and insurance preference. Use up to four times daily as directed. (FOR ICD-9 250.00, 250.01).   [DISCONTINUED] ketoconazole (NIZORAL) 2 % cream Apply 1 application topically daily. As needed.   No facility-administered encounter medications on file as of 01/19/2022.

## 2022-01-23 MED ORDER — VALACYCLOVIR HCL 500 MG PO TABS: 500.0000 mg | ORAL_TABLET | Freq: Every day | ORAL | 0 refills | Status: AC | PRN

## 2022-02-06 ENCOUNTER — Other Ambulatory Visit: Payer: Self-pay | Admitting: Primary Care

## 2022-02-06 ENCOUNTER — Telehealth: Payer: 59 | Admitting: Primary Care

## 2022-02-06 DIAGNOSIS — F411 Generalized anxiety disorder: Secondary | ICD-10-CM

## 2022-02-06 DIAGNOSIS — G8929 Other chronic pain: Secondary | ICD-10-CM

## 2022-02-06 MED ORDER — CYCLOBENZAPRINE HCL 5 MG PO TABS: 5.0000 mg | ORAL_TABLET | Freq: Three times a day (TID) | ORAL | 0 refills | Status: DC | PRN

## 2022-02-06 MED ORDER — HYDROXYZINE HCL 10 MG PO TABS: 10.0000 mg | ORAL_TABLET | Freq: Every evening | ORAL | 0 refills | Status: DC | PRN

## 2022-02-22 ENCOUNTER — Other Ambulatory Visit: Payer: Self-pay | Admitting: Primary Care

## 2022-02-22 DIAGNOSIS — E1165 Type 2 diabetes mellitus with hyperglycemia: Secondary | ICD-10-CM

## 2022-02-24 ENCOUNTER — Other Ambulatory Visit: Payer: Self-pay | Admitting: Primary Care

## 2022-02-24 ENCOUNTER — Ambulatory Visit: Payer: 59 | Admitting: Primary Care

## 2022-02-24 DIAGNOSIS — E559 Vitamin D deficiency, unspecified: Secondary | ICD-10-CM

## 2022-02-24 IMAGING — CR DG ABDOMEN ACUTE W/ 1V CHEST
3 series · 3 of 3 positions shown · non-contrast
Comparison: None.

CLINICAL DATA: Nausea and vomiting.

EXAM:
DG ABDOMEN ACUTE WITH 1 VIEW CHEST

[w chest pa]
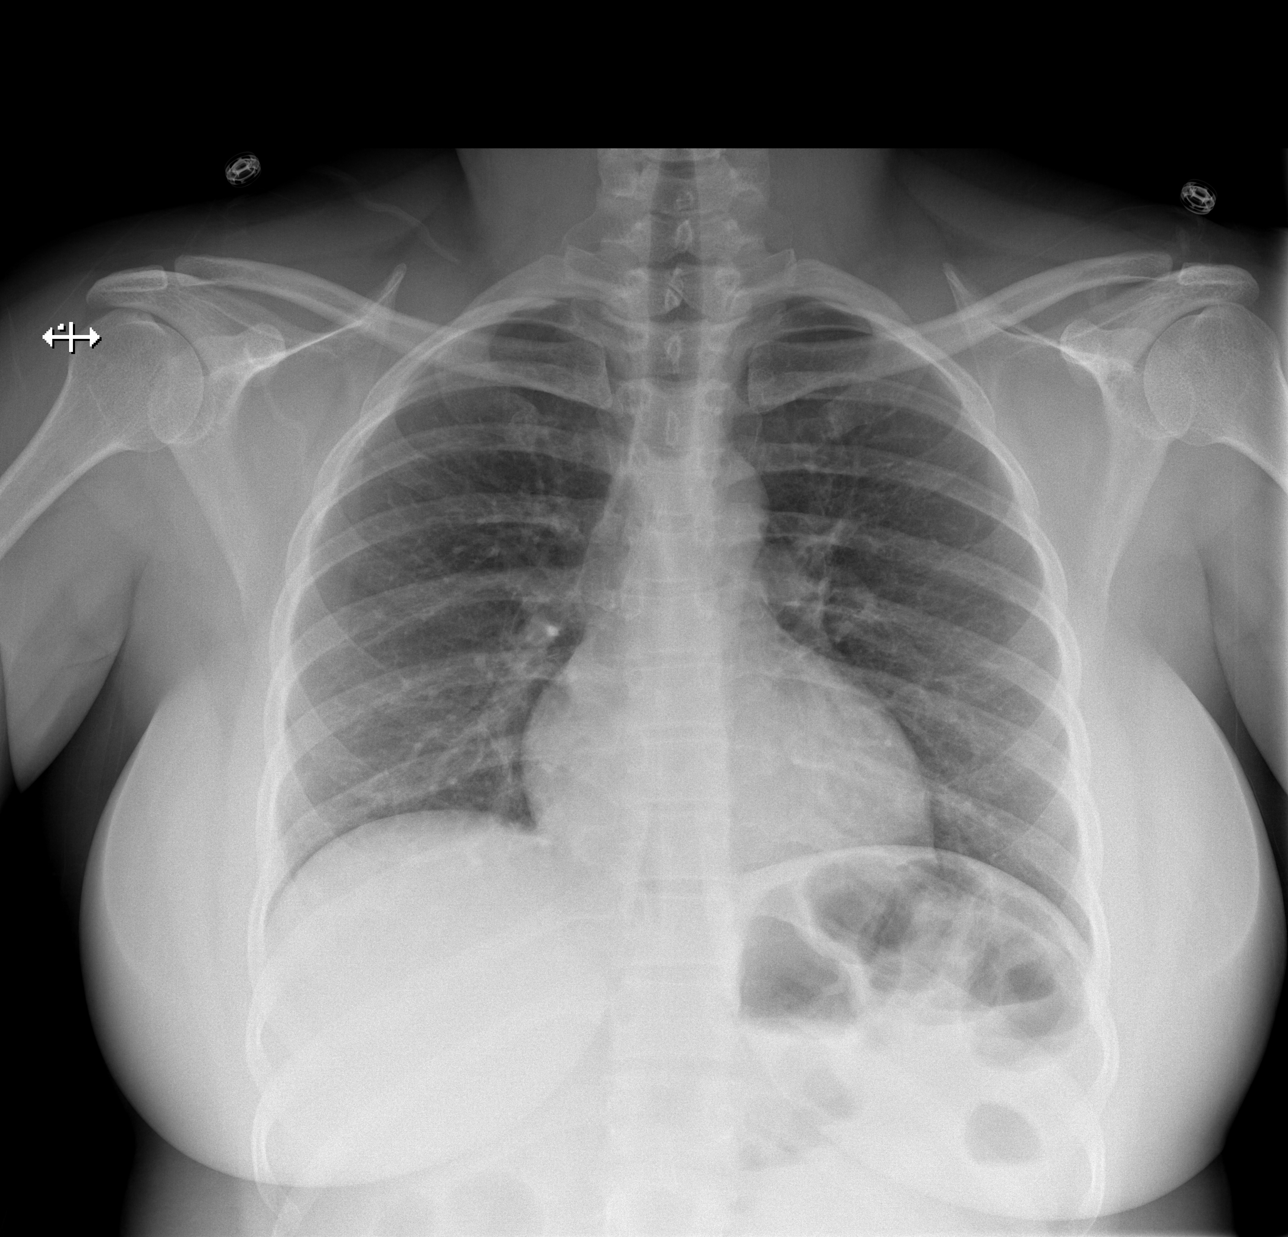

[w abdomen upright]
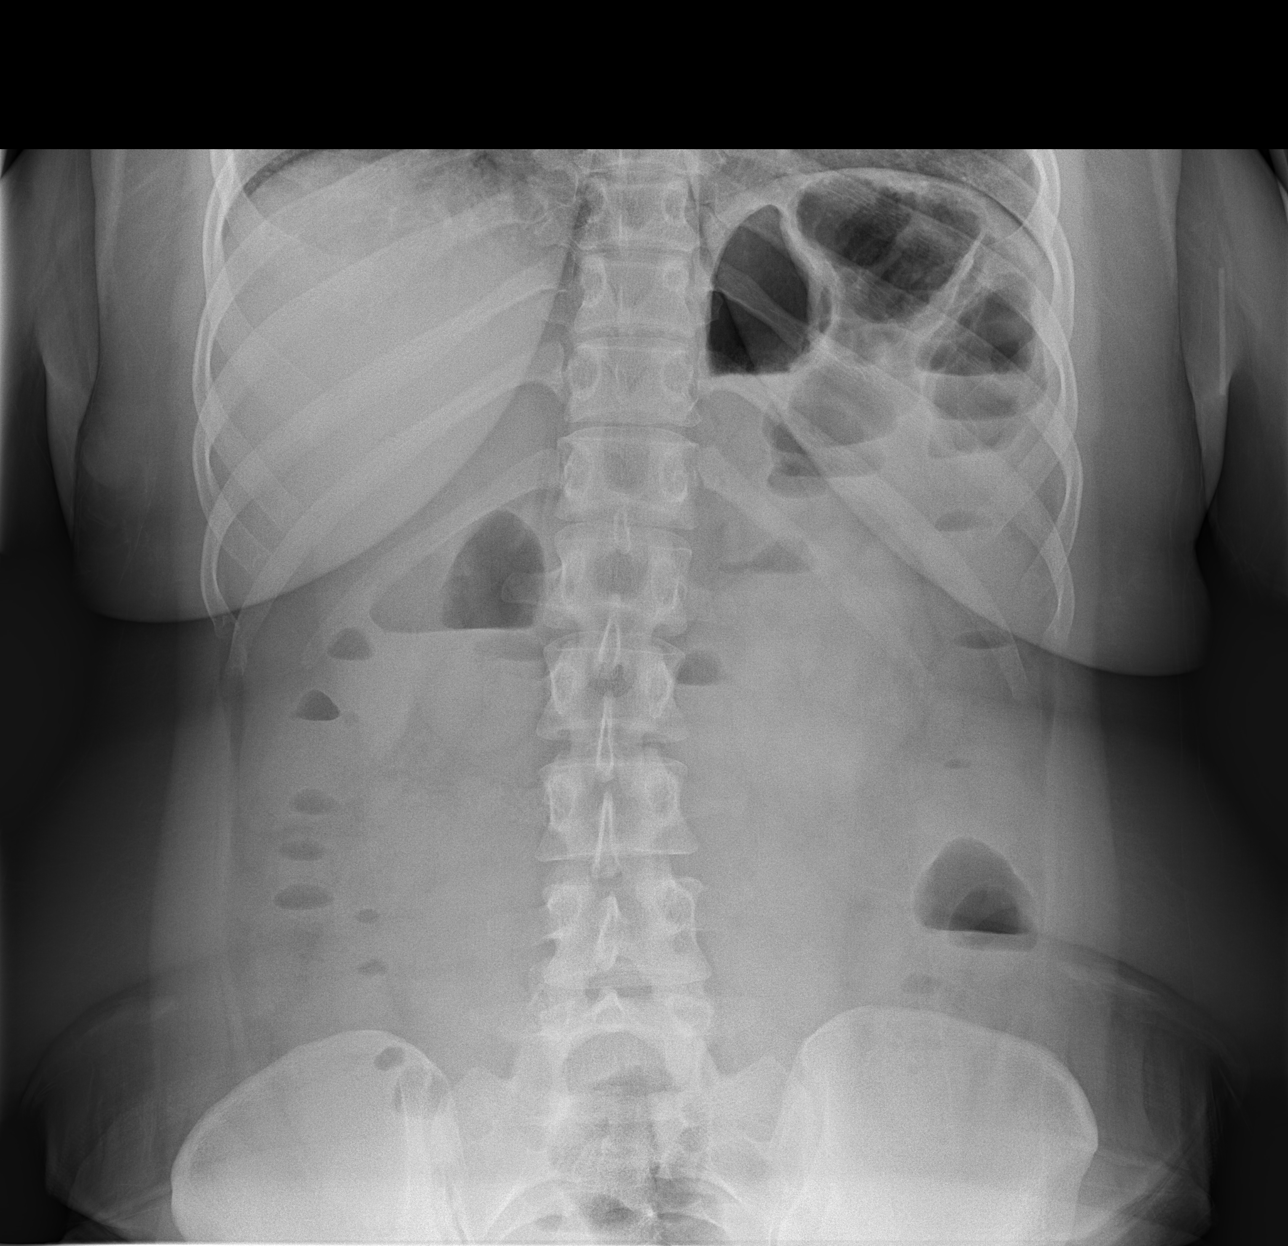

[t abdomen supine]
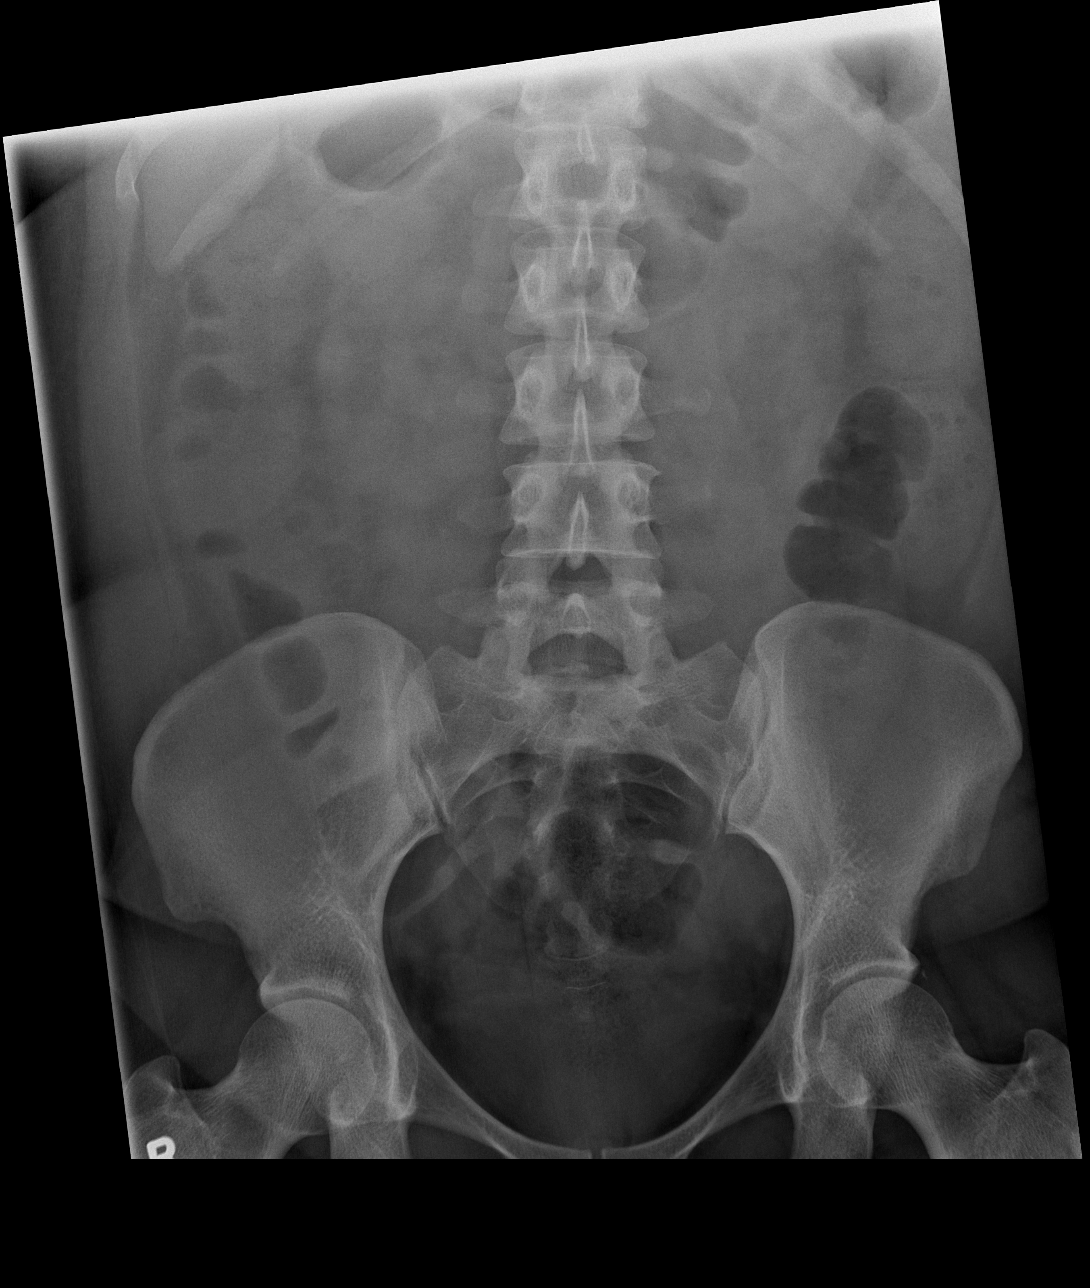

[3 of 3 positions shown; findings below may reference images not displayed]

FINDINGS: Single-view of the chest: Heart size and mediastinal contours are
within normal limits. Lungs are clear. No pleural effusion or
pneumothorax.

Supine and upright views of the abdomen: No dilated large or small
bowel loops. Numerous air-fluid levels throughout the abdomen. No
evidence of free intraperitoneal air. No evidence of renal or
ureteral calculi. Visualized osseous structures are unremarkable.
IMPRESSION: 1. Numerous air-fluid levels throughout the abdomen suggesting
gastroenteritis. No dilated bowel loops.
2. No evidence of acute cardiopulmonary abnormality. No evidence of
pneumonia or pulmonary edema.

## 2022-02-25 ENCOUNTER — Telehealth (INDEPENDENT_AMBULATORY_CARE_PROVIDER_SITE_OTHER): Payer: 59 | Admitting: Family

## 2022-02-25 VITALS — Ht 62.0 in | Wt 186.1 lb

## 2022-02-25 DIAGNOSIS — E1165 Type 2 diabetes mellitus with hyperglycemia: Secondary | ICD-10-CM | POA: Diagnosis not present

## 2022-02-25 DIAGNOSIS — E669 Obesity, unspecified: Secondary | ICD-10-CM

## 2022-02-25 DIAGNOSIS — U071 COVID-19: Secondary | ICD-10-CM | POA: Insufficient documentation

## 2022-02-25 HISTORY — DX: COVID-19: U07.1

## 2022-02-25 MED ORDER — MOLNUPIRAVIR 200 MG PO CAPS: 4.0000 | ORAL_CAPSULE | Freq: Two times a day (BID) | ORAL | 0 refills | Status: AC

## 2022-02-25 NOTE — Assessment & Plan Note (Signed)
Pt considered high risk for hospitalization. have decided pt is a candidate for antiviral and pt agrees that she would like to take this. I have sent in RX for molnupiravir 200 mg capsules to be taken as directed.   Pt with zero risk of pregnancy  advised pt any sexual activity protection needed as well as with nexplanon   Advised of CDC guidelines for self isolation/ ending isolation.  Advised of safe practice guidelines. Symptom Tier reviewed.  Encouraged to monitor for any worsening symptoms; watch for increased shortness of breath, weakness, and signs of dehydration. Advised when to seek emergency care.  Instructed to rest and hydrate well.  Advised to leave the house during recommended isolation period, only if it is necessary to seek medical care

## 2022-02-25 NOTE — Telephone Encounter (Signed)
Pt already scheduled for my chart video 02/25/22 at 10:40 with Mort Sawyers FNP. Sending pt message to Mort Sawyers FNP and Tresa Endo CMA.

## 2022-02-25 NOTE — Patient Instructions (Signed)
Your COVID test is positive. You should remain isolated and quarantine for at least 5 days from start of symptoms. You must be feeling better and be fever free without any fever reducers for at least 24 hours as well. You should wear a mask at all times when out of your home or around others for 5 days after leaving isolation.  Your household contacts should be tested as well as work contacts. If you feel worse or have increasing shortness of breath, you should be seen in person at urgent care or the emergency room.   Due to recent changes in healthcare laws, you may see results of your imaging and/or laboratory studies on MyChart before I have had a chance to review them.  I understand that in some cases there may be results that are confusing or concerning to you. Please understand that not all results are received at the same time and often I may need to interpret multiple results in order to provide you with the best plan of care or course of treatment. Therefore, I ask that you please give me 2 business days to thoroughly review all your results before contacting my office for clarification. Should we see a critical lab result, you will be contacted sooner.   It was a pleasure seeing you today! Please do not hesitate to reach out with any questions and or concerns.  Regards,   Semone Orlov FNP-C   

## 2022-02-25 NOTE — Telephone Encounter (Signed)
[facility] Night - Client TELEPHONE ADVICE RECORD AccessNurse Patient Name: Brenda Valdez NN Gender: Female DOB: [birth year] Age: 36 Y 10 M 17 D Return Phone Number: [phone] (Primary) Address: [place]  [address] Client [facility] 2 Night - Client Client Site [facility] - Night Provider Vernona Rieger - NP Contact Type Call Who Is Calling Patient / Member / Family / Caregiver Call Type Triage / Clinical Relationship To Patient Self Return Phone Number [phone] (Primary) Chief Complaint Runny or Stuffy Nose Reason for Call Symptomatic / Request for Health Information Initial Comment Caller states she tested positive for COVID, she currently has body aches and stuffy nose. Symptoms started this morning. She is a diabetic patient. Translation No Nurse Assessment Nurse: Burman Blacksmith, RN, Kathlene November Date/Time (Eastern Time): 02/25/2022 7:58:35 AM Confirm and document reason for call. If symptomatic, describe symptoms. ---Caller states she tested positive for COVID this AM, she currently has body aches and stuffy nose, cough. Symptoms started this morning. She is a diabetic patient. no other s/s Does the patient have any new or worsening symptoms? ---Yes Will a triage be completed? ---Yes Related visit to physician within the last 2 weeks? ---No Does the PT have any chronic conditions? (i.e. diabetes, asthma, this includes High risk factors for pregnancy, etc.) ---Yes List chronic conditions. ---DM, asthma Is the patient pregnant or possibly pregnant? (Ask all females between the ages of 28-55) ---No Is this a behavioral health or substance abuse call? ---No Guidelines Guideline Title Affirmed Question Affirmed Notes Nurse Date/Time (Eastern Time) COVID-19 - Diagnosed or Suspected MILD difficulty breathing (e.g., minimal/no SOB at rest, SOB with walking, pulse <100) Emch, RN,  Kathlene November 02/25/2022 8:00:08 AM PLEASE NOTE: All timestamps contained within this report are represented as [place] Standard Time. CONFIDENTIALTY NOTICE: This fax transmission is intended only for the addressee. It contains information that is legally privileged, confidential or otherwise protected from use or disclosure. If you are not the intended recipient, you are strictly prohibited from reviewing, disclosing, copying using or disseminating any of this information or taking any action in reliance on or regarding this information. If you have received this fax in error, please notify us immediately by telephone so that we can arrange for its return to [place]. Phone: [phone], Toll-Free: [phone], Fax: [phone] Page: 2 of 2 Call Id: [id number] Disp. Time Lamount Cohen Time) Disposition Final User 02/25/2022 7:43:13 AM Attempt made - message left Emch, RN, Kathlene November 02/25/2022 8:04:21 AM See HCP within 4 Hours (or PCP triage) Yes Emch, RN, Kathlene November Final Disposition 02/25/2022 8:04:21 AM See HCP within 4 Hours (or PCP triage) Yes Emch, RN, Rosalita Levan Disagree/Comply Comply Caller Understands Yes PreDisposition Did not know what to do Care Advice Given Per Guideline * IF OFFICE WILL BE OPEN: You need to be seen within the next 3 or 4 hours. Call your doctor (or NP/PA) now or as soon as the office opens. SEE HCP (OR PCP TRIAGE) WITHIN 4 HOURS: CALL BACK IF: * You become worse Referrals REFERRED TO PCP OFFIC

## 2022-02-25 NOTE — Progress Notes (Signed)
MyChart Video Visit    Virtual Visit via Video Note   This visit type was conducted due to national recommendations for restrictions regarding the COVID-19 Pandemic (e.g. social distancing) in an effort to limit this patient's exposure and mitigate transmission in our community. This patient is at least at moderate risk for complications without adequate follow up. This format is felt to be most appropriate for this patient at this time. Physical exam was limited by quality of the video and audio technology used for the visit. CMA was able to get the patient set up on a video visit.  Patient location: Home. Patient and provider in visit Provider location: Office  I discussed the limitations of evaluation and management by telemedicine and the availability of in person appointments. The patient expressed understanding and agreed to proceed.  Visit Date: 02/25/2022  Today's healthcare provider: Mort Sawyers, FNP     Subjective:    Patient ID: Brenda Valdez, female    DOB: November 30, 1985, 36 y.o.   MRN: [id number]  Chief Complaint  Patient presents with   Covid Positive    This am    Fatigue   Shortness of Breath   Cough    Shortness of Breath  Cough Associated symptoms include shortness of breath.    Pt here today via video visit with concerns.   Last night started with symptoms Tested positive for covid this am C/o fatigue, nasal congestion, cough with doe. Cough is nonproductive.  Slight sore throat.   Just got home from a cruise, likely caught it from there.   Hasn't had to use albuterol as of yet.  With asthma diagnosis.  No sexual activity  in over one year , on nexplanon as well.   Past Medical History:  Diagnosis Date   ADHD    Asthma    excercise induced   Chiari I malformation (HCC)    COVID-19 virus infection 09/11/2021   Decreased fetal movement during pregnancy in third trimester, antepartum 12/05/2014   Decreased fetal movement in pregnancy in  third trimester, antepartum 12/23/2014   Diabetes mellitus without complication (HCC)    Hyperlipidemia    IBS (irritable bowel syndrome)    Internal hemorrhoids    noted on colonoscopy from 2020   No pertinent past medical history    Vaginal Pap smear, abnormal     Past Surgical History:  Procedure Laterality Date   CESAREAN SECTION N/A 12/25/2014   Procedure: CESAREAN SECTION;  Surgeon: Shea Evans, MD;  Location: [facility];  Service: Obstetrics;  Laterality: N/A;   LAPAROSCOPY  03/24/2011   Procedure: LAPAROSCOPY DIAGNOSTIC;  Surgeon: Meriel Pica;  Location: [facility];  Service: Gynecology;  Laterality: N/A;   LASER ABLATION CONDOLAMATA N/A 06/16/2013   Procedure:  CO2 LASER ABLATION CONDOLAMATA;  Surgeon: Meriel Pica, MD;  Location: [facility];  Service: Gynecology;  Laterality: N/A;  CO2 LASER ABLATION OF VAIN & CIN   LEEP  08/11/2012   Procedure: LOOP ELECTROSURGICAL EXCISION PROCEDURE (LEEP);  Surgeon: Meriel Pica, MD;  Location: [facility];  Service: Gynecology;  Laterality: N/A;   WISDOM TOOTH EXTRACTION      Family History  Problem Relation Age of Onset   COPD Mother    Anxiety disorder Mother    Dementia Mother    Stroke Mother    Diabetes Father    Hypertension Father    Kidney disease Father     Social History   Socioeconomic History   Marital status:  Single    Spouse name: Not on file   Number of children: 1   Years of education: BA   Highest education level: Not on file  Occupational History   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[place]  Tobacco Use   Smoking status: Never   Smokeless tobacco: Never  Vaping Use   Vaping Use: Never used  Substance and Sexual Activity   Alcohol use: No    Comment: socially   Drug use: No   Sexual activity: Yes    Birth control/protection: None  Other Topics Concern   Not on file  Social History Narrative   Lives with mother and daughter   Caffeine use: Drinks 1/2 cup per day   Right handed    Social Determinants of  Health   Financial Resource Strain: Not on file  Food Insecurity: Not on file  Transportation Needs: Not on file  Physical Activity: Not on file  Stress: Not on file  Social Connections: Not on file  Intimate Partner Violence: Not on file    Outpatient Medications Prior to Visit  Medication Sig Dispense Refill   albuterol (VENTOLIN HFA) 108 (90 Base) MCG/ACT inhaler Inhale 1-2 puffs into the lungs every 6 (six) hours as needed for wheezing or shortness of breath. 8 g 0   Continuous Blood Gluc Sensor (FREESTYLE LIBRE 3 SENSOR) MISC 1 Units by Does not apply route 4 (four) times daily. Place 1 sensor on the skin every 14 days. 6 each 3   cyclobenzaprine (FLEXERIL) 5 MG tablet Take 1 tablet (5 mg total) by mouth 3 (three) times daily as needed for muscle spasms. 30 tablet 0   etonogestrel (NEXPLANON) 68 MG IMPL implant 68 mg by Subdermal route once.     folic acid (FOLVITE) 1 MG tablet Take 1 mg by mouth daily.     glipiZIDE (GLUCOTROL XL) 5 MG 24 hr tablet Take 1 tablet (5 mg total) by mouth daily with breakfast. For diabetes. 90 tablet 1   hydrOXYzine (ATARAX) 10 MG tablet Take 1-2 tablets (10-20 mg total) by mouth at bedtime as needed. For anxiety and sleep. 180 tablet 0   Multiple Vitamin (MULTIVITAMIN) capsule Take by mouth.     MYDAYIS 37.5 MG CP24 Take 1 capsule by mouth every morning.     ondansetron (ZOFRAN ODT) 4 MG disintegrating tablet 4mg  ODT q4 hours prn nausea/vomit 12 tablet 0   pantoprazole (PROTONIX) 20 MG tablet Take 1 tablet (20 mg total) by mouth daily. 15 tablet 0   rosuvastatin (CRESTOR) 5 MG tablet Take 1 tablet (5 mg total) by mouth daily. 90 tablet 3   Semaglutide,0.25 or 0.5MG /DOS, (OZEMPIC, 0.25 OR 0.5 MG/DOSE,) 2 MG/3ML SOPN Inject 0.25 mg into the skin once a week. After 1 month, increase to 0.5 mg each week 3 mL 2   valACYclovir (VALTREX) 500 MG tablet Take 1 tablet (500 mg total) by mouth daily as needed (for outbreaks). 10 tablet 0   Vitamin D, Ergocalciferol,  (DRISDOL) 1.25 MG (50000 UNIT) CAPS capsule Take 1 capsule by mouth once weekly for 12 weeks. 12 capsule 0   dicyclomine (BENTYL) 20 MG tablet Take 20 mg by mouth every 6 (six) hours as needed for spasms. (Patient not taking: Reported on 02/25/2022)     No facility-administered medications prior to visit.    Allergies  Allergen Reactions   Metformin And Related Rash    Review of Systems  Respiratory:  Positive for cough and shortness of breath.  Objective:    Physical Exam Vitals reviewed.  Constitutional:      General: She is not in acute distress.    Appearance: She is well-developed. She is obese. She is ill-appearing. She is not toxic-appearing or diaphoretic.  Pulmonary:     Effort: Pulmonary effort is normal.  Neurological:     General: No focal deficit present.     Mental Status: She is alert and oriented to person, place, and time. Mental status is at baseline.  Psychiatric:        Mood and Affect: Mood normal.        Behavior: Behavior normal.        Thought Content: Thought content normal.        Judgment: Judgment normal.     Ht 5\' 2"  (1.575 m)   Wt 186 lb 2 oz (84.4 kg)   BMI 34.04 kg/m  Wt Readings from Last 3 Encounters:  02/25/22 186 lb 2 oz (84.4 kg)  01/19/22 186 lb 2 oz (84.4 kg)  11/06/21 175 lb 12.8 oz (79.7 kg)       Assessment & Plan:   Problem List Items Addressed This Visit       Endocrine   Type 2 diabetes mellitus with hyperglycemia (HCC)   Relevant Medications   molnupiravir EUA (LAGEVRIO) 200 MG CAPS capsule     Other   COVID-19 - Primary    Pt considered high risk for hospitalization. have decided pt is a candidate for antiviral and pt agrees that she would like to take this. I have sent in RX for molnupiravir 200 mg capsules to be taken as directed.   Pt with zero risk of pregnancy  advised pt any sexual activity protection needed as well as with nexplanon   Advised of CDC guidelines for self isolation/ ending  isolation.  Advised of safe practice guidelines. Symptom Tier reviewed.  Encouraged to monitor for any worsening symptoms; watch for increased shortness of breath, weakness, and signs of dehydration. Advised when to seek emergency care.  Instructed to rest and hydrate well.  Advised to leave the house during recommended isolation period, only if it is necessary to seek medical care       Relevant Medications   molnupiravir EUA (LAGEVRIO) 200 MG CAPS capsule   Obesity (BMI 30.0-34.9)   Relevant Medications   molnupiravir EUA (LAGEVRIO) 200 MG CAPS capsule    I have discontinued 12-05-1992 A[employer] Vitamin D (Ergocalciferol) and dicyclomine. I am also having her start on molnupiravir EUA. Additionally, I am having her maintain her Nexplanon, multivitamin, Mydayis, rosuvastatin, ondansetron, pantoprazole, folic acid, albuterol, glipiZIDE, FreeStyle Libre 3 Sensor, Ozempic (0.25 or 0.5 MG/DOSE), valACYclovir, cyclobenzaprine, and hydrOXYzine.  Meds ordered this encounter  Medications   molnupiravir EUA (LAGEVRIO) 200 MG CAPS capsule    Sig: Take 4 capsules (800 mg total) by mouth in the morning and at bedtime for 5 days.    Dispense:  40 capsule    Refill:  0    Order Specific Question:   Supervising Provider    Answer:   BEDSOLE, AMY E [[id number]]    I discussed the assessment and treatment plan with the patient. The patient was provided an opportunity to ask questions and all were answered. The patient agreed with the plan and demonstrated an understanding of the instructions.   The patient was advised to call back or seek an in-person evaluation if the symptoms worsen or if the condition fails to improve as anticipated.  I provided 15 minutes of face-to-face time during this encounter.   Eugenia Pancoast, [place] [facility] [phone] (phone) [phone] (fax)  [place]

## 2022-02-25 NOTE — Telephone Encounter (Signed)
Saw patient already, thank you for speaking with the patient.  Please see note for further information.  

## 2022-03-17 ENCOUNTER — Encounter: Payer: Self-pay | Admitting: Primary Care

## 2022-03-17 ENCOUNTER — Ambulatory Visit (INDEPENDENT_AMBULATORY_CARE_PROVIDER_SITE_OTHER): Payer: 59 | Admitting: Primary Care

## 2022-03-17 VITALS — BP 102/68 | HR 65 | Temp 97.3°F | Ht 62.0 in | Wt 186.0 lb

## 2022-03-17 DIAGNOSIS — E559 Vitamin D deficiency, unspecified: Secondary | ICD-10-CM | POA: Diagnosis not present

## 2022-03-17 DIAGNOSIS — E785 Hyperlipidemia, unspecified: Secondary | ICD-10-CM | POA: Diagnosis not present

## 2022-03-17 DIAGNOSIS — E1165 Type 2 diabetes mellitus with hyperglycemia: Secondary | ICD-10-CM

## 2022-03-17 LAB — HEPATIC FUNCTION PANEL
ALT: 12 U/L (ref 0–35)
AST: 15 U/L (ref 0–37)
Albumin: 4.3 g/dL (ref 3.5–5.2)
Alkaline Phosphatase: 52 U/L (ref 39–117)
Bilirubin, Direct: 0.1 mg/dL (ref 0.0–0.3)
Total Bilirubin: 0.4 mg/dL (ref 0.2–1.2)
Total Protein: 7.1 g/dL (ref 6.0–8.3)

## 2022-03-17 LAB — LIPID PANEL
Cholesterol: 181 mg/dL (ref 0–200)
HDL: 34.8 mg/dL — ABNORMAL LOW (ref >39.00)
LDL Cholesterol: 126 mg/dL — ABNORMAL HIGH (ref 0–99)
NonHDL: 146.19
Total CHOL/HDL Ratio: 5
Triglycerides: 102 mg/dL (ref 0.0–149.0)
VLDL: 20.4 mg/dL (ref 0.0–40.0)

## 2022-03-17 LAB — POCT GLYCOSYLATED HEMOGLOBIN (HGB A1C): Hemoglobin A1C: 7.8 % — AB (ref 4.0–5.6)

## 2022-03-17 LAB — VITAMIN D 25 HYDROXY (VIT D DEFICIENCY, FRACTURES): VITD: 17.82 ng/mL — ABNORMAL LOW (ref 30.00–100.00)

## 2022-03-17 LAB — TSH: TSH: 1.15 u[IU]/mL (ref 0.35–5.50)

## 2022-03-17 MED ORDER — SEMAGLUTIDE (1 MG/DOSE) 4 MG/3ML ~~LOC~~ SOPN: 1.0000 mg | PEN_INJECTOR | SUBCUTANEOUS | 0 refills | Status: DC

## 2022-03-17 MED ORDER — GLIPIZIDE ER 5 MG PO TB24: 5.0000 mg | ORAL_TABLET | Freq: Every day | ORAL | 1 refills | Status: DC

## 2022-03-17 NOTE — Patient Instructions (Signed)
We increased your dose of Ozempic to 1 mg weekly. Continue Glipizide XL 5 mg daily.  Stop by the lab prior to leaving today. I will notify you of your results once received.   Schedule a lab appointment for 3 months for diabetes check.  Please schedule a physical to meet with me in 6 months.   It was a pleasure to see you today!

## 2022-03-17 NOTE — Assessment & Plan Note (Signed)
Continue rosuvastatin 5 mg daily. Repeat lipid panel pending 

## 2022-03-17 NOTE — Progress Notes (Signed)
Subjective:    Patient ID: Brenda Valdez, female    DOB: 07-29-1986, 36 y.o.   MRN: [id number]  Diabetes Pertinent negatives for hypoglycemia include no dizziness. Pertinent negatives for diabetes include no chest pain.    Brenda Valdez is a very pleasant 36 y.o. female with a history of type 2 diabetes, asthma, hyperlipidemia, chronic back pain, IBS, Covid-19 infection who presents today for follow up of chronic conditions.  1) Type 2 Diabetes:  Current medications include: Glipizide XL 5 mg daily, Ozempic 0.5 mg weekly.   She is checking her blood glucose continuously. She is in range 40-50% of the time, mostly running 180's-250's outside of her desired.   Last A1C: 7.4 in April 2023, 7.8 today Last Eye Exam: UTD Last Foot Exam: UTD Pneumonia Vaccination: Never completed, declines today. Urine Microalbumin: UTD Statin: rosuvastatin   Dietary changes since last visit: None. Overall tries to eat a healthy diet. She would like her thyroid levels checked.   Exercise: No regular exercise. Active at work.   2) Vitamin D Deficiency: Currently managed on Vitamin D 50,000 IU weekly. She often forgets to take this weekly, would like to transition over to once daily vitamin D. She has taken consistently once weekly for the last 3 weeks.  3) Hyperlipidemia: Currently managed on rosuvastatin 5 mg daily. Due for repeat lipid panel today. She endorses daily compliance to rosuvastatin.   Review of Systems  Eyes:  Negative for visual disturbance.  Respiratory:  Negative for shortness of breath.   Cardiovascular:  Negative for chest pain.  Neurological:  Negative for dizziness.         Past Medical History:  Diagnosis Date   ADHD    Asthma    excercise induced   Chiari I malformation (HCC)    COVID-19 virus infection 09/11/2021   Decreased fetal movement during pregnancy in third trimester, antepartum 12/05/2014   Decreased fetal movement in pregnancy in third trimester, antepartum  12/23/2014   Diabetes mellitus without complication (HCC)    Hyperlipidemia    IBS (irritable bowel syndrome)    Internal hemorrhoids    noted on colonoscopy from 2020   No pertinent past medical history    Vaginal Pap smear, abnormal     Social History   Socioeconomic History   Marital status: Single    Spouse name: Not on file   Number of children: 1   Years of education: BA   Highest education level: Not on file  Occupational History   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[place]  Tobacco Use   Smoking status: Never   Smokeless tobacco: Never  Vaping Use   Vaping Use: Never used  Substance and Sexual Activity   Alcohol use: No    Comment: socially   Drug use: No   Sexual activity: Yes    Birth control/protection: None  Other Topics Concern   Not on file  Social History Narrative   Lives with mother and daughter   Caffeine use: Drinks 1/2 cup per day   Right handed    Social Determinants of Health   Financial Resource Strain: Not on file  Food Insecurity: Not on file  Transportation Needs: Not on file  Physical Activity: Not on file  Stress: Not on file  Social Connections: Not on file  Intimate Partner Violence: Not on file    Past Surgical History:  Procedure Laterality Date   CESAREAN SECTION N/A 12/25/2014   Procedure: CESAREAN SECTION;  Surgeon: Shea Evans, MD;  Location: [facility];  Service: Obstetrics;  Laterality: N/A;   LAPAROSCOPY  03/24/2011   Procedure: LAPAROSCOPY DIAGNOSTIC;  Surgeon: Meriel Pica;  Location: [facility];  Service: Gynecology;  Laterality: N/A;   LASER ABLATION CONDOLAMATA N/A 06/16/2013   Procedure:  CO2 LASER ABLATION CONDOLAMATA;  Surgeon: Meriel Pica, MD;  Location: [facility];  Service: Gynecology;  Laterality: N/A;  CO2 LASER ABLATION OF VAIN & CIN   LEEP  08/11/2012   Procedure: LOOP ELECTROSURGICAL EXCISION PROCEDURE (LEEP);  Surgeon: Meriel Pica, MD;  Location: [facility];  Service: Gynecology;  Laterality: N/A;   WISDOM TOOTH  EXTRACTION      Family History  Problem Relation Age of Onset   COPD Mother    Anxiety disorder Mother    Dementia Mother    Stroke Mother    Diabetes Father    Hypertension Father    Kidney disease Father     Allergies  Allergen Reactions   Metformin And Related Rash    Current Outpatient Medications on File Prior to Visit  Medication Sig Dispense Refill   albuterol (VENTOLIN HFA) 108 (90 Base) MCG/ACT inhaler Inhale 1-2 puffs into the lungs every 6 (six) hours as needed for wheezing or shortness of breath. 8 g 0   Continuous Blood Gluc Sensor (FREESTYLE LIBRE 3 SENSOR) MISC 1 Units by Does not apply route 4 (four) times daily. Place 1 sensor on the skin every 14 days. 6 each 3   cyclobenzaprine (FLEXERIL) 5 MG tablet Take 1 tablet (5 mg total) by mouth 3 (three) times daily as needed for muscle spasms. 30 tablet 0   etonogestrel (NEXPLANON) 68 MG IMPL implant 68 mg by Subdermal route once.     folic acid (FOLVITE) 1 MG tablet Take 1 mg by mouth daily.     hydrOXYzine (ATARAX) 10 MG tablet Take 1-2 tablets (10-20 mg total) by mouth at bedtime as needed. For anxiety and sleep. 180 tablet 0   Multiple Vitamin (MULTIVITAMIN) capsule Take by mouth.     MYDAYIS 37.5 MG CP24 Take 1 capsule by mouth every morning.     ondansetron (ZOFRAN ODT) 4 MG disintegrating tablet 4mg  ODT q4 hours prn nausea/vomit 12 tablet 0   pantoprazole (PROTONIX) 20 MG tablet Take 1 tablet (20 mg total) by mouth daily. 15 tablet 0   rosuvastatin (CRESTOR) 5 MG tablet Take 1 tablet (5 mg total) by mouth daily. 90 tablet 3   valACYclovir (VALTREX) 500 MG tablet Take 1 tablet (500 mg total) by mouth daily as needed (for outbreaks). 10 tablet 0   No current facility-administered medications on file prior to visit.    BP 102/68 (BP Location: Left Arm, Patient Position: Sitting, Cuff Size: Normal)   Pulse 65   Temp (!) 97.3 F (36.3 C) (Temporal)   Ht 5\' 2"  (1.575 m)   Wt 186 lb (84.4 kg)   SpO2 98%   BMI  34.02 kg/m  Objective:   Physical Exam Cardiovascular:     Rate and Rhythm: Normal rate and regular rhythm.  Pulmonary:     Effort: Pulmonary effort is normal.     Breath sounds: Normal breath sounds.  Musculoskeletal:     Cervical back: Neck supple.  Skin:    General: Skin is warm and dry.           Assessment & Plan:   Problem List Items Addressed This Visit       Endocrine   Type 2 diabetes mellitus with  hyperglycemia (HCC) - Primary    Slightly worse compared to 3 months ago.  Increase Ozempic to 1 mg weekly. Continue glipizide XL 5 mg daily.  Managed on statin. Urine microalbumin UTD. She declines pneumonia vaccine. Eye and foot exams UTD.  Repeat A1C in 3 months, follow up in 6 months.      Relevant Medications   Semaglutide, 1 MG/DOSE, 4 MG/3ML SOPN   glipiZIDE (GLUCOTROL XL) 5 MG 24 hr tablet   Other Relevant Orders   POCT glycosylated hemoglobin (Hb A1C) (Completed)   TSH     Other   Hyperlipidemia    Continue rosuvastatin 5 mg daily. Repeat lipid panel pending.      Relevant Orders   Lipid panel   Hepatic function panel   Vitamin D deficiency    Overall non compliant to weekly regimen. Obtain vitamin D level today, will switch to daily dose once labs return.      Relevant Orders   VITAMIN D 25 Hydroxy (Vit-D Deficiency, Fractures)       [name], NP

## 2022-03-17 NOTE — Assessment & Plan Note (Signed)
Overall non compliant to weekly regimen. Obtain vitamin D level today, will switch to daily dose once labs return.

## 2022-03-17 NOTE — Assessment & Plan Note (Signed)
Slightly worse compared to 3 months ago.  Increase Ozempic to 1 mg weekly. Continue glipizide XL 5 mg daily.  Managed on statin. Urine microalbumin UTD. She declines pneumonia vaccine. Eye and foot exams UTD.  Repeat A1C in 3 months, follow up in 6 months.

## 2022-05-14 ENCOUNTER — Other Ambulatory Visit: Payer: Self-pay | Admitting: Primary Care

## 2022-05-14 DIAGNOSIS — G8929 Other chronic pain: Secondary | ICD-10-CM

## 2022-05-14 DIAGNOSIS — F411 Generalized anxiety disorder: Secondary | ICD-10-CM

## 2022-06-03 ENCOUNTER — Ambulatory Visit (INDEPENDENT_AMBULATORY_CARE_PROVIDER_SITE_OTHER): Payer: 59 | Admitting: Internal Medicine

## 2022-06-03 DIAGNOSIS — Z8241 Family history of sudden cardiac death: Secondary | ICD-10-CM

## 2022-06-03 NOTE — Progress Notes (Unsigned)
Cardiology Office Note:    Date:  06/04/2022   ID:  Brenda Valdez, DOB 02/11/1986, MRN [id number]  PCP:  Brenda Koch, NP  [facility] Cardiologist:  Werner Lean, MD  [facility] Electrophysiologist:  None   CC:  FH  History of Present Illness:    Brenda Valdez is a 36 y.o. female with a hx of T2DM with HLD who presents for evaluation 09/02/20. In interim of this visit, patient negative echo and stress echo.  Did not wish to start statin in 2023.  PATIENT NO SHOW- NO CHARGE.   Past Medical History:  Diagnosis Date   ADHD    Asthma    excercise induced   Chiari I malformation ([place])    COVID-19 virus infection 09/11/2021   Decreased fetal movement during pregnancy in third trimester, antepartum 12/05/2014   Decreased fetal movement in pregnancy in third trimester, antepartum 12/23/2014   Diabetes mellitus without complication (HCC)    Hyperlipidemia    IBS (irritable bowel syndrome)    Internal hemorrhoids    noted on colonoscopy from 2020   No pertinent past medical history    Vaginal Pap smear, abnormal     Past Surgical History:  Procedure Laterality Date   CESAREAN SECTION N/A 12/25/2014   Procedure: CESAREAN SECTION;  Surgeon: Azucena Fallen, MD;  Location: [facility];  Service: Obstetrics;  Laterality: N/A;   LAPAROSCOPY  03/24/2011   Procedure: LAPAROSCOPY DIAGNOSTIC;  Surgeon: Margarette Asal;  Location: [facility];  Service: Gynecology;  Laterality: N/A;   LASER ABLATION CONDOLAMATA N/A 06/16/2013   Procedure:  CO2 LASER ABLATION CONDOLAMATA;  Surgeon: Margarette Asal, MD;  Location: [facility];  Service: Gynecology;  Laterality: N/A;  CO2 LASER ABLATION OF VAIN & CIN   LEEP  08/11/2012   Procedure: LOOP ELECTROSURGICAL EXCISION PROCEDURE (LEEP);  Surgeon: Margarette Asal, MD;  Location: [facility];  Service: Gynecology;  Laterality: N/A;   WISDOM TOOTH EXTRACTION      Current Medications: No outpatient medications have been marked as taking for the  06/03/22 encounter (Office Visit) with Werner Lean, MD.     Allergies:   Metformin and related   Social History   Socioeconomic History   Marital status: Single    Spouse name: Not on file   Number of children: 1   Years of education: BA   Highest education level: Not on file  Occupational History   Occupation: [employer]  Tobacco Use   Smoking status: Never   Smokeless tobacco: Never  Vaping Use   Vaping Use: Never used  Substance and Sexual Activity   Alcohol use: No    Comment: socially   Drug use: No   Sexual activity: Yes    Birth control/protection: None  Other Topics Concern   Not on file  Social History Narrative   Lives with mother and daughter   Caffeine use: Drinks 1/2 cup per day   Right handed    Social Determinants of Health   Financial Resource Strain: Not on file  Food Insecurity: Not on file  Transportation Needs: Not on file  Physical Activity: Not on file  Stress: Not on file  Social Connections: Not on file    SOCIAL: Works as a Audiological scientist; also a mom to a  ~ 71 year old girl.  Family History: The patient's family history includes Anxiety disorder in her mother; COPD in her mother; Dementia in her mother; Diabetes in her father; Hypertension  in her father; Kidney disease in her father; Stroke in her mother. History of coronary artery disease notable for father, maternal aunt. History of heart failure notable for no members. No history of cardiomyopathies including hypertrophic cardiomyopathy, left ventricular non-compaction, or arrhythmogenic right ventricular cardiomyopathy History of arrhythmia notable for no members. Family history of sudden cardiac death in uncle and cousin (cousin died suddenly on the basketball court) No history of bicuspid aortic valve or aortic aneurysm or dissection.  ROS:   Please see the history of present illness.    All other systems reviewed and are negative.  EKGs/Labs/Other Studies  Reviewed:    The following studies were reviewed today:  EKG:   08/05/2020: SR 96 WNL  Transthoracic Echocardiogram: Date: 10/21/20 Results: 1. Left ventricular ejection fraction, by estimation, is 55 to 60%. Left  ventricular ejection fraction by 3D volume is 65 %. The left ventricle has  normal function. The left ventricle has no regional wall motion  abnormalities. Left ventricular diastolic   parameters were normal.   2. Right ventricular systolic function is normal. The right ventricular  size is normal.   3. The mitral valve is normal in structure. Trivial mitral valve  regurgitation. No evidence of mitral stenosis.   4. The aortic valve is normal in structure. Aortic valve regurgitation is  not visualized. No aortic stenosis is present.   5. The inferior vena cava is normal in size with greater than 50%  respiratory variability, suggesting right atrial pressure of 3 mmHg.   Stress Echo: Date: 10/21/20 Results: 2D Echo Findings: Baseline regional wall motion abnormalities were not  present. There were no stress-induced wall motion abnormalities. This is a  negative stress echocardiogram for ischemia.    1. This is a negative stress echocardiogram for ischemia.   2. This is a low risk study.   Recent Labs: 11/06/2021: BUN 20; Creatinine, Ser 0.77; Potassium 4.5; Sodium 135 03/17/2022: ALT 12; TSH 1.15  Recent Lipid Panel    Component Value Date/Time   CHOL 181 03/17/2022 0911   CHOL 241 (H) 03/20/2021 0747   TRIG 102.0 03/17/2022 0911   HDL 34.80 (L) 03/17/2022 0911   HDL 34 (L) 03/20/2021 0747   CHOLHDL 5 03/17/2022 0911   VLDL 20.4 03/17/2022 0911   LDLCALC 126 (H) 03/17/2022 0911   LDLCALC 182 (H) 03/20/2021 0747     Physical Exam:    VS:  There were no vitals taken for this visit.    Wt Readings from Last 3 Encounters:  03/17/22 186 lb (84.4 kg)  02/25/22 186 lb 2 oz (84.4 kg)  01/19/22 186 lb 2 oz (84.4 kg)   THIS IS EXAM FROM LAST VISIT.  PATIENT DID  NOT SHOW UP.  GEN:  Well nourished, well developed in no acute distress HEENT: Normal NECK: No JVD; No carotid bruits LYMPHATICS: No lymphadenopathy CARDIAC: RRR, II /IV systolic ejection murmur no rubs or gallops RESPIRATORY:  Clear to auscultation without rales, wheezing or rhonchi  ABDOMEN: Soft, non-tender, non-distended MUSCULOSKELETAL:  No edema; No deformity  SKIN: Warm and dry NEUROLOGIC:  Alert and oriented x 3 PSYCHIATRIC:  Normal affect   ASSESSMENT:    No diagnosis found.  PLAN:    THIS WAS OUR PLAN FROM LAST YEAR; THE PATIENT DID NOT SHOW UP  HLD with DM (FH) Family history of SCD in cousin - Consider echo in 2027 for follow up given SCD NOS in family (2nd degree member) - LDL goal less than 100 - gave  education on dietary changes; eats out a lot and is working on making some changes; discussed vegan and pescatarian diets - discussed medication management (notes statin concerns) - SHARED DECISION MAKING:  Patient is going to attempt some dietary changes while we start rosuvastatin 5 mg PO Daily; we will recheck lipids and LFTs in 3 months; when she feels she has optimized her diet and exercise plan we will do a trial off medication and see if she meets her goal; deferred Lp(a)  One year or PRN   Medication Adjustments/Labs and Tests Ordered: Current medicines are reviewed at length with the patient today.  Concerns regarding medicines are outlined above.  No orders of the defined types were placed in this encounter.  No orders of the defined types were placed in this encounter.   There are no Patient Instructions on file for this visit.   Signed, Christell Constant, MD  06/04/2022 8:27 AM     [facility]

## 2022-06-09 ENCOUNTER — Ambulatory Visit: Payer: 59 | Admitting: Primary Care

## 2022-06-09 ENCOUNTER — Encounter: Payer: Self-pay | Admitting: Primary Care

## 2022-06-09 ENCOUNTER — Telehealth (INDEPENDENT_AMBULATORY_CARE_PROVIDER_SITE_OTHER): Payer: 59 | Admitting: Primary Care

## 2022-06-09 DIAGNOSIS — E1165 Type 2 diabetes mellitus with hyperglycemia: Secondary | ICD-10-CM

## 2022-06-09 MED ORDER — GLIPIZIDE ER 10 MG PO TB24: 10.0000 mg | ORAL_TABLET | Freq: Every day | ORAL | 0 refills | Status: DC

## 2022-06-09 MED ORDER — TIRZEPATIDE 2.5 MG/0.5ML ~~LOC~~ SOAJ: 2.5000 mg | SUBCUTANEOUS | 0 refills | Status: DC

## 2022-06-09 NOTE — Progress Notes (Signed)
Patient ID: Brenda Valdez, female    DOB: 1986-05-13, 36 y.o.   MRN: [id number]  Virtual visit completed through [place], a video enabled telemedicine application. Due to national recommendations of social distancing due to COVID-19, a virtual visit is felt to be most appropriate for this patient at this time. Reviewed limitations, risks, security and privacy concerns of performing a virtual visit and the availability of in person appointments. I also reviewed that there may be a patient responsible charge related to this service. The patient agreed to proceed.   Patient location: home Provider location: [place] at [facility], office Persons participating in this virtual visit: patient, provider   If any vitals were documented, they were collected by patient at home unless specified below.    There were no vitals taken for this visit.   CC: Diabetes Follow Up Subjective:   HPI: Brenda Valdez is a 36 y.o. female with a history of type 2 diabetes presenting on 06/09/2022 for of diabetes.   Current medications include: Glipizide XL 5 mg daily, Ozempic 1 mg weekly.   Unfortunately, she hasn't been able to take Ozempic regularly as it has been out of stock nationally and has been experiencing GI side effects. She's been spacing out her injections every 2.5 weeks.   She did have some GI side effects with the Ozempic 0.5 mg dose but symptoms were tolerable.   She is checking her blood glucose continuously and is getting readings ranging mid to high 200's, and over the last week she's noticed low 300's.    Last A1C: 7.8 in August 2023 Last Eye Exam: Due Last Foot Exam: UTD Pneumonia Vaccination: Never completed, declined Urine Microalbumin: UTD Statin: rosuvastatin   Dietary changes since last visit: Poor diet over the last few months.    Exercise: None       Relevant past medical, surgical, family and social history reviewed and updated as indicated. Interim medical history  since our last visit reviewed. Allergies and medications reviewed and updated. Outpatient Medications Prior to Visit  Medication Sig Dispense Refill   albuterol (VENTOLIN HFA) 108 (90 Base) MCG/ACT inhaler Inhale 1-2 puffs into the lungs every 6 (six) hours as needed for wheezing or shortness of breath. 8 g 0   Continuous Blood Gluc Sensor (FREESTYLE LIBRE 3 SENSOR) MISC 1 Units by Does not apply route 4 (four) times daily. Place 1 sensor on the skin every 14 days. 6 each 3   cyclobenzaprine (FLEXERIL) 5 MG tablet TAKE 1 TABLET BY MOUTH THREE TIMES A DAY AS NEEDED FOR MUSCLE SPASMS 30 tablet 0   etonogestrel (NEXPLANON) 68 MG IMPL implant 68 mg by Subdermal route once.     folic acid (FOLVITE) 1 MG tablet Take 1 mg by mouth daily.     hydrOXYzine (ATARAX) 10 MG tablet TAKE 1-2 TABLETS (10-20 MG TOTAL) BY MOUTH AT BEDTIME AS NEEDED. FOR ANXIETY AND SLEEP. 180 tablet 0   MYDAYIS 37.5 MG CP24 Take 1 capsule by mouth every morning.     ondansetron (ZOFRAN ODT) 4 MG disintegrating tablet 4mg  ODT q4 hours prn nausea/vomit 12 tablet 0   pantoprazole (PROTONIX) 20 MG tablet Take 1 tablet (20 mg total) by mouth daily. 15 tablet 0   rosuvastatin (CRESTOR) 5 MG tablet Take 1 tablet (5 mg total) by mouth daily. 90 tablet 3   valACYclovir (VALTREX) 500 MG tablet Take 1 tablet (500 mg total) by mouth daily as needed (for outbreaks). 10 tablet 0  glipiZIDE (GLUCOTROL XL) 5 MG 24 hr tablet Take 1 tablet (5 mg total) by mouth daily with breakfast. for diabetes. 90 tablet 1   Semaglutide, 1 MG/DOSE, 4 MG/3ML SOPN Inject 1 mg as directed once a week. for diabetes. 9 mL 0   Multiple Vitamin (MULTIVITAMIN) capsule Take by mouth. (Patient not taking: Reported on 06/09/2022)     No facility-administered medications prior to visit.     Per HPI unless specifically indicated in ROS section below Review of Systems  Respiratory:  Negative for shortness of breath.   Cardiovascular:  Negative for chest pain.   Neurological:  Negative for numbness.   Objective:  There were no vitals taken for this visit.  Wt Readings from Last 3 Encounters:  03/17/22 186 lb (84.4 kg)  02/25/22 186 lb 2 oz (84.4 kg)  01/19/22 186 lb 2 oz (84.4 kg)       Physical exam: General: Alert and oriented x 3, no distress, does not appear sickly  Pulmonary: Speaks in complete sentences without increased work of breathing, no cough during visit.  Psychiatric: Normal mood, thought content, and behavior.     Results for orders placed or performed in visit on 03/17/22  VITAMIN D 25 Hydroxy (Vit-D Deficiency, Fractures)  Result Value Ref Range   VITD 17.82 (L) 30.00 - 100.00 ng/mL  Lipid panel  Result Value Ref Range   Cholesterol 181 0 - 200 mg/dL   Triglycerides 573.2 0.0 - 149.0 mg/dL   HDL 20.25 (L) >42.70 mg/dL   VLDL 62.3 0.0 - 76.2 mg/dL   LDL Cholesterol 831 (H) 0 - 99 mg/dL   Total CHOL/HDL Ratio 5    NonHDL 146.19   Hepatic function panel  Result Value Ref Range   Total Bilirubin 0.4 0.2 - 1.2 mg/dL   Bilirubin, Direct 0.1 0.0 - 0.3 mg/dL   Alkaline Phosphatase 52 39 - 117 U/L   AST 15 0 - 37 U/L   ALT 12 0 - 35 U/L   Total Protein 7.1 6.0 - 8.3 g/dL   Albumin 4.3 3.5 - 5.2 g/dL  TSH  Result Value Ref Range   TSH 1.15 0.35 - 5.50 uIU/mL  POCT glycosylated hemoglobin (Hb A1C)  Result Value Ref Range   Hemoglobin A1C 7.8 (A) 4.0 - 5.6 %   HbA1c POC (<> result, manual entry)     HbA1c, POC (prediabetic range)     HbA1c, POC (controlled diabetic range)     Assessment & Plan:   Problem List Items Addressed This Visit       Endocrine   Type 2 diabetes mellitus with hyperglycemia (HCC) - Primary    Seems to be uncontrolled based on glucose readings.  Unfortunately, Ozempic is causing moderate GI side effects. Stop Ozempic.  Start Mounjaro 2.5 mg weekly x4 weeks, then increase to 5 mg weekly thereafter. Increase glipizide XL to 10 mg daily.  I have asked her to update me regarding her  glucose readings in 3 weeks. She will work on her diet.  We will follow back up with her in the office after the new year.      Relevant Medications   tirzepatide (MOUNJARO) 2.5 MG/0.5ML Pen   glipiZIDE (GLUCOTROL XL) 10 MG 24 hr tablet     Meds ordered this encounter  Medications   tirzepatide (MOUNJARO) 2.5 MG/0.5ML Pen    Sig: Inject 2.5 mg into the skin once a week. for diabetes.    Dispense:  2 mL  Refill:  0    Order Specific Question:   Supervising Provider    Answer:   BEDSOLE, AMY E [[id number]]   glipiZIDE (GLUCOTROL XL) 10 MG 24 hr tablet    Sig: Take 1 tablet (10 mg total) by mouth daily with breakfast. for diabetes.    Dispense:  90 tablet    Refill:  0    Order Specific Question:   Supervising Provider    Answer:   BEDSOLE, AMY E [[id number]]   No orders of the defined types were placed in this encounter.   I discussed the assessment and treatment plan with the patient. The patient was provided an opportunity to ask questions and all were answered. The patient agreed with the plan and demonstrated an understanding of the instructions. The patient was advised to call back or seek an in-person evaluation if the symptoms worsen or if the condition fails to improve as anticipated.  Follow up plan:  Stop Ozempic for diabetes.  Start Mounjaro 2.5 mg once weekly x4 weeks for diabetes. We increased your glipizide XL to 10 mg daily.  Update me regarding your blood sugars in about 3 weeks.  It was a pleasure to see you today!   [name], NP

## 2022-06-09 NOTE — Patient Instructions (Signed)
Stop Ozempic for diabetes.  Start Mounjaro 2.5 mg once weekly x4 weeks for diabetes. We increased your glipizide XL to 10 mg daily.  Update me regarding your blood sugars in about 3 weeks.  It was a pleasure to see you today!

## 2022-06-09 NOTE — Assessment & Plan Note (Signed)
Seems to be uncontrolled based on glucose readings.  Unfortunately, Ozempic is causing moderate GI side effects. Stop Ozempic.  Start Mounjaro 2.5 mg weekly x4 weeks, then increase to 5 mg weekly thereafter. Increase glipizide XL to 10 mg daily.  I have asked her to update me regarding her glucose readings in 3 weeks. She will work on her diet.  We will follow back up with her in the office after the new year.

## 2022-07-07 ENCOUNTER — Other Ambulatory Visit: Payer: Self-pay | Admitting: Primary Care

## 2022-07-07 DIAGNOSIS — E1165 Type 2 diabetes mellitus with hyperglycemia: Secondary | ICD-10-CM

## 2022-07-08 NOTE — Telephone Encounter (Signed)
Please call patient:  How has she been doing with the Mounjaro 2.5 mg dose? I will increase to 5 mg weekly if she's doing well, but we also need to see her in January for follow up.

## 2022-07-08 NOTE — Telephone Encounter (Signed)
Unable to reach patient. Left voicemail to return call to our office.   

## 2022-07-08 NOTE — Telephone Encounter (Signed)
Patient called and she would like to keep her Mounjaro dose at 2.5 mg for another 4 weeks and then increase to 5mg  and follow up in office after that.

## 2022-07-08 NOTE — Telephone Encounter (Signed)
Noted  

## 2022-08-06 ENCOUNTER — Other Ambulatory Visit: Payer: Self-pay | Admitting: Primary Care

## 2022-08-06 DIAGNOSIS — E1165 Type 2 diabetes mellitus with hyperglycemia: Secondary | ICD-10-CM

## 2022-08-07 ENCOUNTER — Other Ambulatory Visit: Payer: Self-pay | Admitting: Primary Care

## 2022-08-07 DIAGNOSIS — F411 Generalized anxiety disorder: Secondary | ICD-10-CM

## 2022-08-07 NOTE — Telephone Encounter (Signed)
Please call patient, read her the message I sent her via MyChart.

## 2022-08-07 NOTE — Telephone Encounter (Signed)
Unable to reach patient. Left voicemail to return call to our office.   

## 2022-08-10 ENCOUNTER — Other Ambulatory Visit: Payer: Self-pay | Admitting: Primary Care

## 2022-08-10 MED ORDER — TIRZEPATIDE 5 MG/0.5ML ~~LOC~~ SOAJ: 5.0000 mg | SUBCUTANEOUS | 0 refills | Status: DC

## 2022-08-10 NOTE — Telephone Encounter (Signed)
Unable to reach patient. Left voicemail to return call to our office.   

## 2022-08-10 NOTE — Telephone Encounter (Signed)
Scheduled patient a follow up visit 09/02/22.

## 2022-08-10 NOTE — Telephone Encounter (Signed)
Noted  

## 2022-08-28 IMAGING — DX DG CHEST 2V
2 series · 2 of 2 positions shown · non-contrast
Comparison: 08/02/2020

CLINICAL DATA: 35-year-old female with pneumonia and cough

EXAM:
CHEST - 2 VIEW

[chest pa]
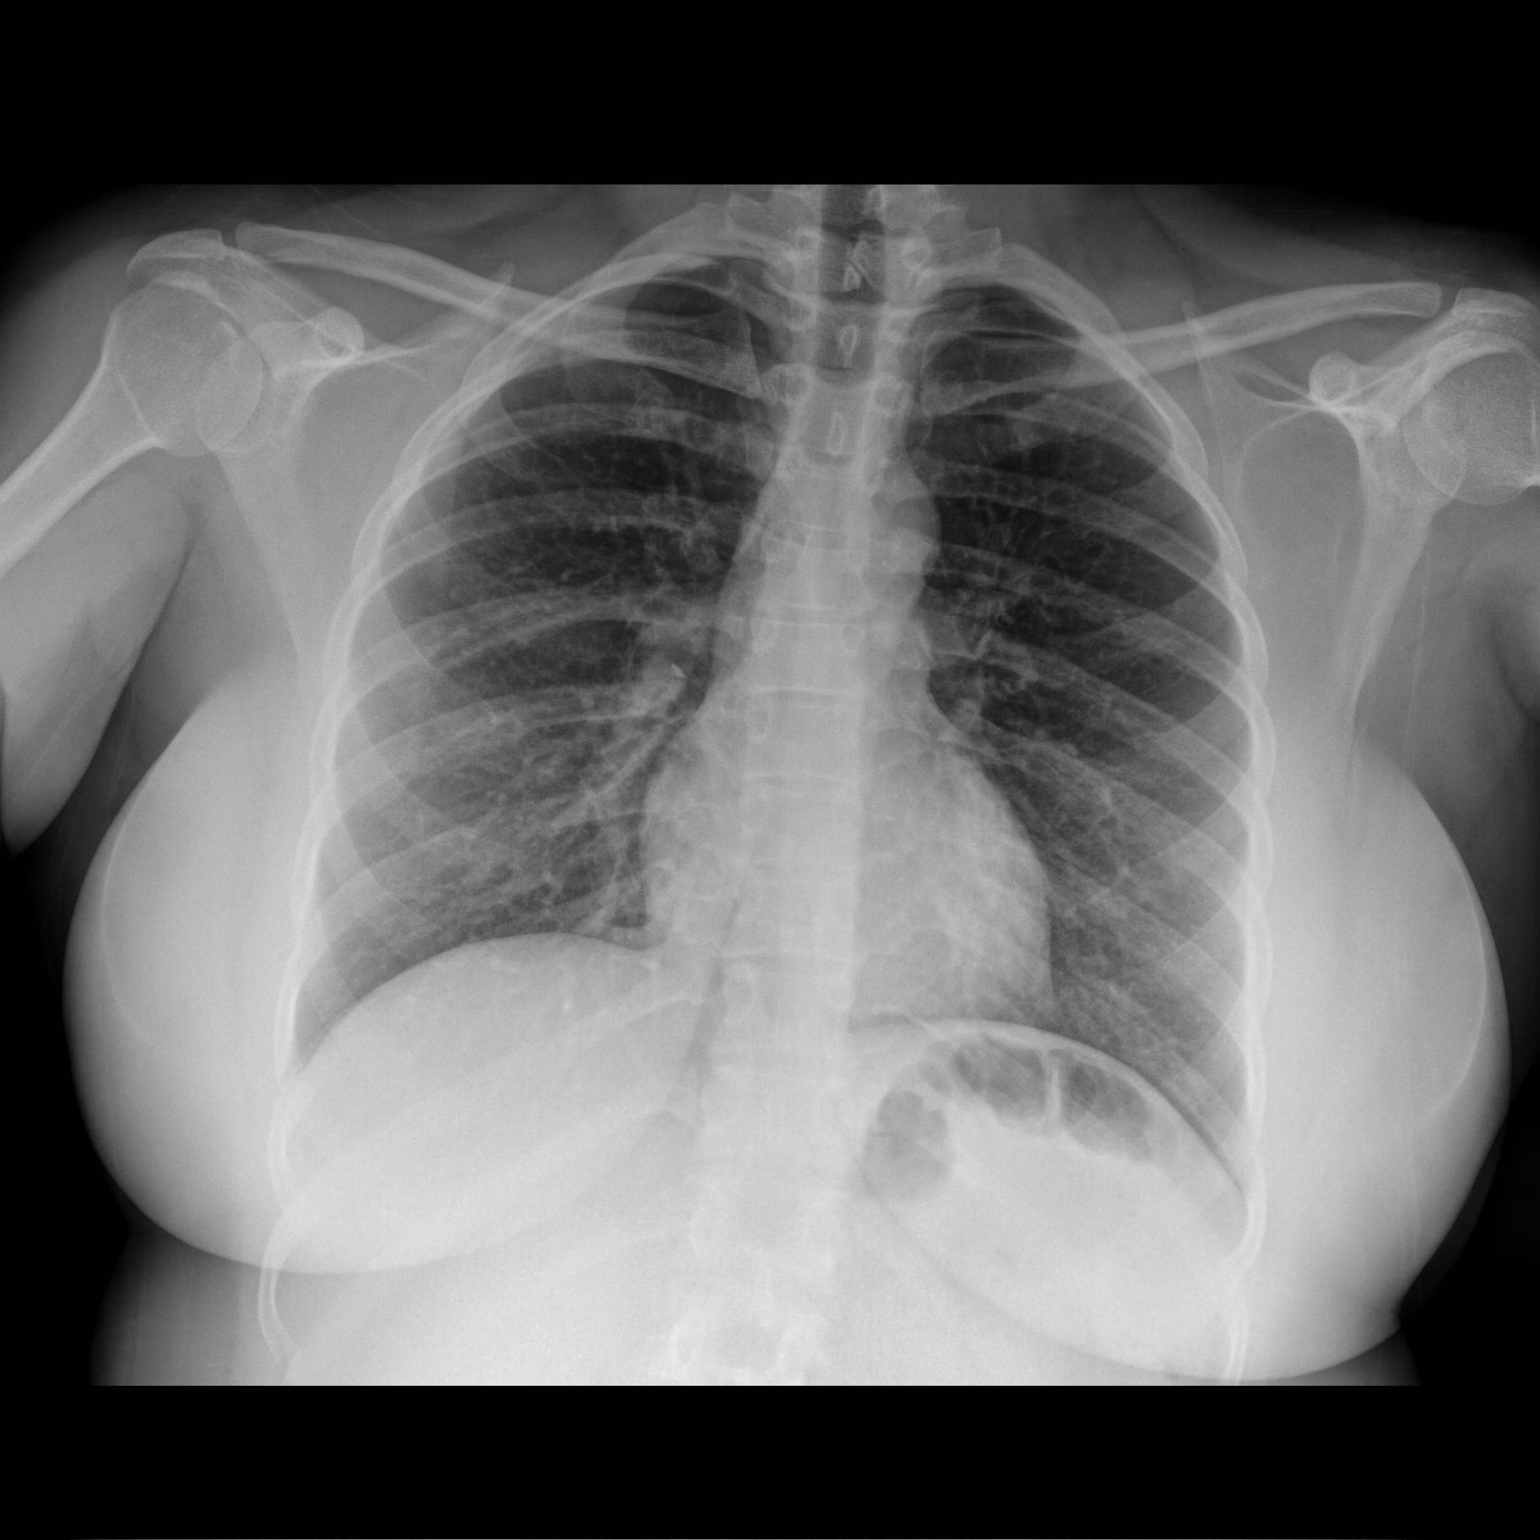

[chest lat]
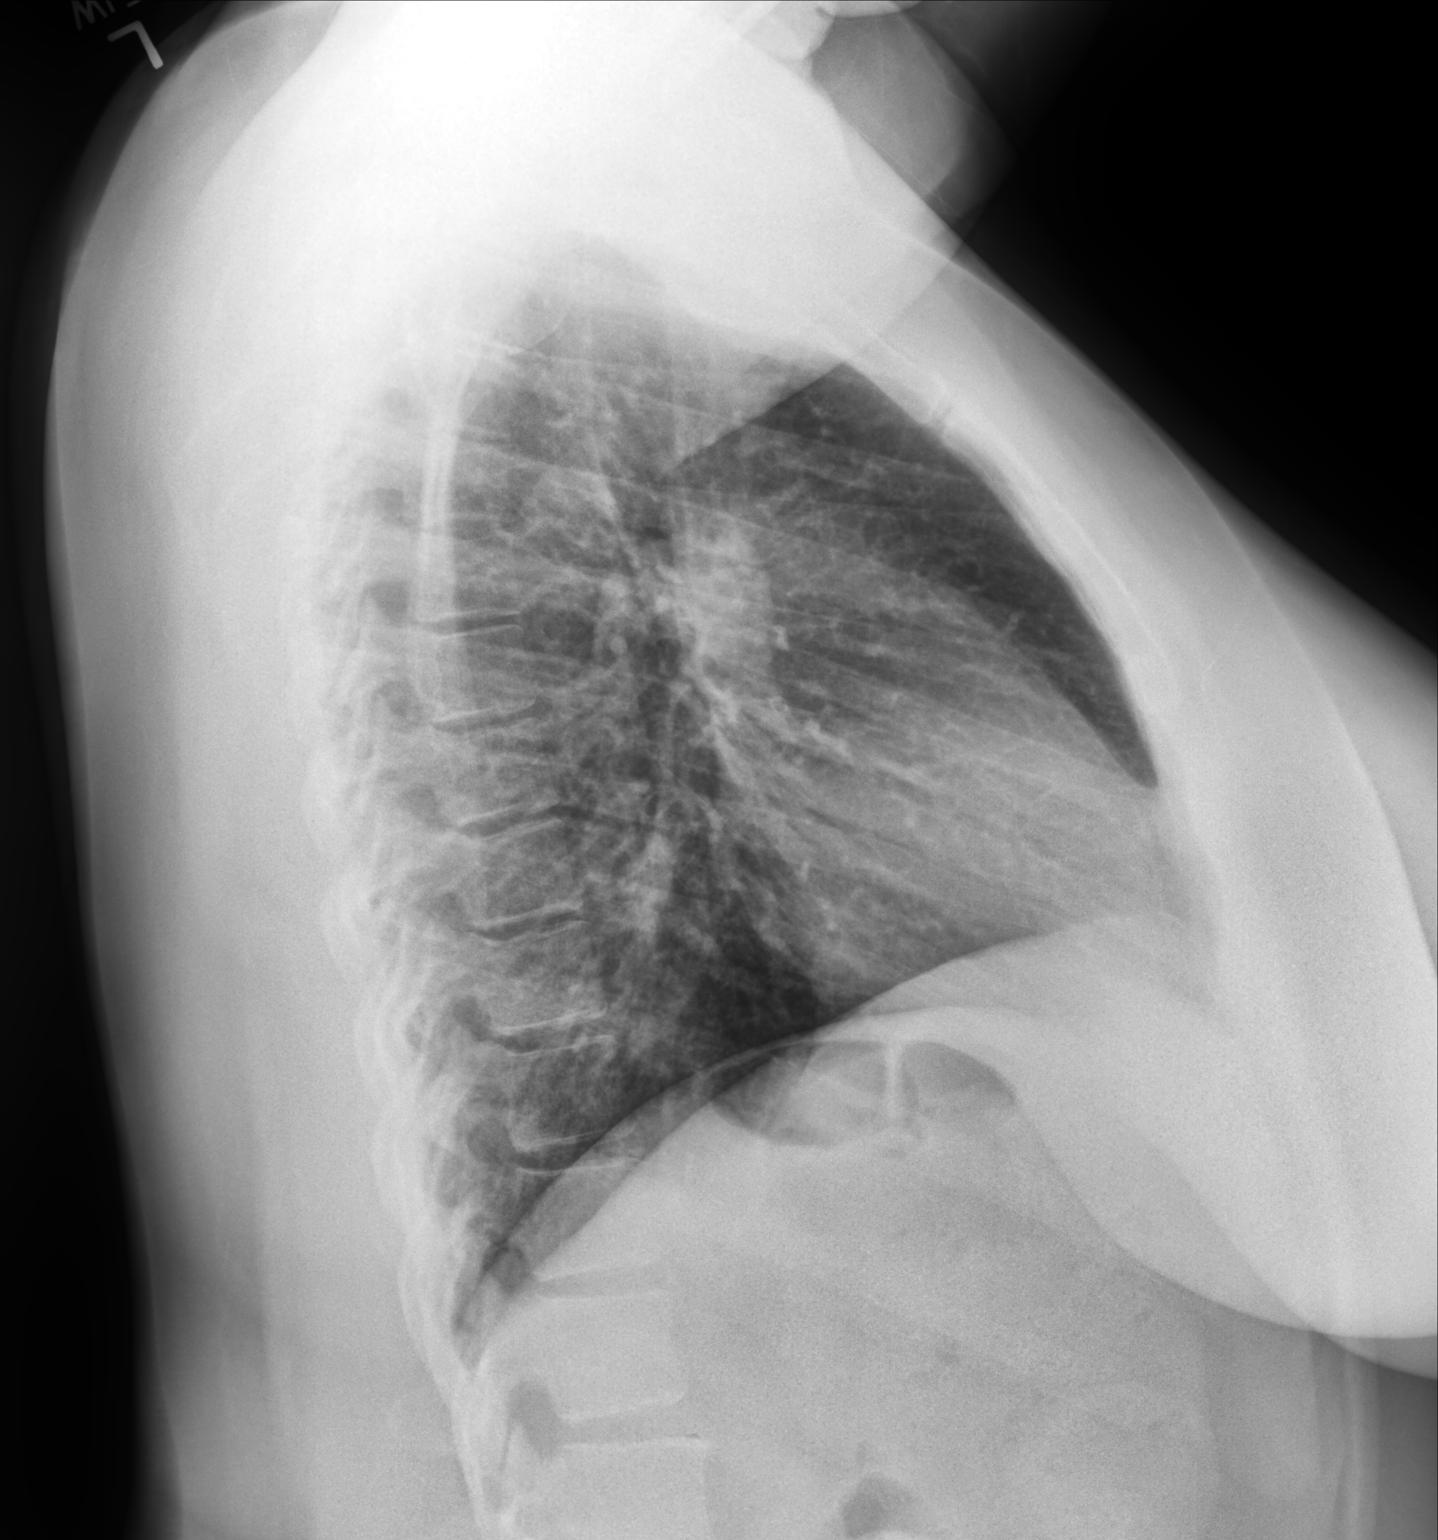

[2 of 2 positions shown; findings below may reference images not displayed]

FINDINGS: Cardiomediastinal silhouette unchanged in size and contour. No
evidence of central vascular congestion. No interlobular septal
thickening. No pneumothorax or pleural effusion. No confluent
airspace disease.

No acute displaced fracture
IMPRESSION: No active cardiopulmonary disease.

## 2022-09-02 ENCOUNTER — Encounter: Payer: Self-pay | Admitting: Primary Care

## 2022-09-02 ENCOUNTER — Ambulatory Visit: Payer: 59 | Admitting: Primary Care

## 2022-09-02 VITALS — BP 122/66 | HR 89 | Temp 97.5°F | Ht 62.0 in | Wt 184.0 lb

## 2022-09-02 DIAGNOSIS — G8929 Other chronic pain: Secondary | ICD-10-CM

## 2022-09-02 DIAGNOSIS — E1165 Type 2 diabetes mellitus with hyperglycemia: Secondary | ICD-10-CM | POA: Diagnosis not present

## 2022-09-02 DIAGNOSIS — E1169 Type 2 diabetes mellitus with other specified complication: Secondary | ICD-10-CM

## 2022-09-02 DIAGNOSIS — J4520 Mild intermittent asthma, uncomplicated: Secondary | ICD-10-CM | POA: Diagnosis not present

## 2022-09-02 DIAGNOSIS — E559 Vitamin D deficiency, unspecified: Secondary | ICD-10-CM | POA: Diagnosis not present

## 2022-09-02 DIAGNOSIS — F411 Generalized anxiety disorder: Secondary | ICD-10-CM

## 2022-09-02 DIAGNOSIS — E785 Hyperlipidemia, unspecified: Secondary | ICD-10-CM

## 2022-09-02 DIAGNOSIS — K582 Mixed irritable bowel syndrome: Secondary | ICD-10-CM

## 2022-09-02 DIAGNOSIS — A6000 Herpesviral infection of urogenital system, unspecified: Secondary | ICD-10-CM

## 2022-09-02 DIAGNOSIS — M5442 Lumbago with sciatica, left side: Secondary | ICD-10-CM

## 2022-09-02 LAB — CBC
HCT: 37.6 % (ref 36.0–46.0)
Hemoglobin: 12.6 g/dL (ref 12.0–15.0)
MCHC: 33.6 g/dL (ref 30.0–36.0)
MCV: 81.6 fl (ref 78.0–100.0)
Platelets: 353 10*3/uL (ref 150.0–400.0)
RBC: 4.6 Mil/uL (ref 3.87–5.11)
RDW: 13.7 % (ref 11.5–15.5)
WBC: 10.4 10*3/uL (ref 4.0–10.5)

## 2022-09-02 LAB — LIPID PANEL
Cholesterol: 248 mg/dL — ABNORMAL HIGH (ref 0–200)
HDL: 39.6 mg/dL (ref >39.00)
LDL Cholesterol: 190 mg/dL — ABNORMAL HIGH (ref 0–99)
NonHDL: 208.79
Total CHOL/HDL Ratio: 6
Triglycerides: 94 mg/dL (ref 0.0–149.0)
VLDL: 18.8 mg/dL (ref 0.0–40.0)

## 2022-09-02 LAB — COMPREHENSIVE METABOLIC PANEL
ALT: 16 U/L (ref 0–35)
AST: 19 U/L (ref 0–37)
Albumin: 4.6 g/dL (ref 3.5–5.2)
Alkaline Phosphatase: 62 U/L (ref 39–117)
BUN: 12 mg/dL (ref 6–23)
CO2: 25 mEq/L (ref 19–32)
Calcium: 9.6 mg/dL (ref 8.4–10.5)
Chloride: 101 mEq/L (ref 96–112)
Creatinine, Ser: 0.84 mg/dL (ref 0.40–1.20)
GFR: 90.05 mL/min (ref >60.00)
Glucose, Bld: 183 mg/dL — ABNORMAL HIGH (ref 70–99)
Potassium: 4 mEq/L (ref 3.5–5.1)
Sodium: 135 mEq/L (ref 135–145)
Total Bilirubin: 0.6 mg/dL (ref 0.2–1.2)
Total Protein: 8.2 g/dL (ref 6.0–8.3)

## 2022-09-02 LAB — POCT GLYCOSYLATED HEMOGLOBIN (HGB A1C): Hemoglobin A1C: 7.9 % — AB (ref 4.0–5.6)

## 2022-09-02 LAB — VITAMIN D 25 HYDROXY (VIT D DEFICIENCY, FRACTURES): VITD: 20.38 ng/mL — ABNORMAL LOW (ref 30.00–100.00)

## 2022-09-02 NOTE — Assessment & Plan Note (Addendum)
Chronic with intermittent flares.   Denies any concerns after starting Mounjaro as ozempic was causing frequent flare ups.   Follows GI.   I evaluated patient, was consulted regarding treatment, and agree with assessment and plan per Tinnie Gens, RN, DNP student.   Allie Bossier, NP-C

## 2022-09-02 NOTE — Assessment & Plan Note (Addendum)
Repeat lipid panel pending.  Discussed with patient about importance of medication adherence.   I evaluated patient, was consulted regarding treatment, and agree with assessment and plan per Tinnie Gens, RN, DNP student.   Allie Bossier, NP-C

## 2022-09-02 NOTE — Assessment & Plan Note (Addendum)
Slightly worse with A1C reading of 7.9 today.  Would like to see A1C <7.  Continue Glipizide 10 mg by mouth daily. Continue Mounjaro 5 mg weekly, tolerating well.  Discussed with patient about monitoring her diet and importance of exercise.   Foot exam UTD, Urine ACR UTD, Eye exam UTD Declines Pneumonia vaccine Managed by Rosuvastatin.  Follow up in 3 months to check A1C. Follow up in 6 months in office.  I evaluated patient, was consulted regarding treatment, and agree with assessment and plan per Tinnie Gens, RN, DNP student.   Allie Bossier, NP-C

## 2022-09-02 NOTE — Assessment & Plan Note (Addendum)
Continue Rosuvastatin 5 mg daily.   Discussed with patient the importance of medication adherence.  Lipid panel pending.  CMP pending.  I evaluated patient, was consulted regarding treatment, and agree with assessment and plan per Tinnie Gens, RN, DNP student.   Allie Bossier, NP-C

## 2022-09-02 NOTE — Progress Notes (Signed)
Established Patient Office Visit  Subjective   Patient ID: Brenda Valdez, female    DOB: January 21, 1986  Age: 37 y.o. MRN: [id number]  Chief Complaint  Patient presents with   Medical Management of Chronic Issues    HPI  Brenda Valdez is a 37 year old female with past medical history of type 2 diabetes, anxiety and Asthma presents today for a follow up.   Current medications include: Glipizide 10 mg daily and Mounjaro 5 mg weekly (started it last night).   She is checking her blood glucose with continuous blood glucose readings and has been getting readings on average have been between 161-240. Her fasting readings have been in 200s. Her overnight readings 100-160s.  Last A1C: 7.8 in August 2023 Last Eye Exam: UTD Last Foot Exam: UTD Pneumonia Vaccination: Never completed, patient declines. Urine Microalbumin: UTD Statin:Rosuvastatin  Dietary changes since last visit: Always on the go. Mostly fast food. Usually eats 1-1.5 meals a day. Does eat fried and baked chicken. She does drink 8-16 oz sweet tea however she tries to drink water through out.   Exercise: no regular exercise. Active at work.   Asthma: currently managed with Albuterol PRN. Denies any flare ups. She did have a viral upper respiratory infection three weeks ago when she did have to use her albuterol. Otherwise she is doing well.   IBS: Intermittent flare ups. Once every three months and lasts for twenty four hours. It is usually diarrhea. Denies any nausea or vomiting. She avoids trigger foods to avoid flare ups. 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 Follows with GI but has not seen them in the last year.   Anxiety: Currently managed with hydroxyzine PRN. Overall controlled. Feels that medication is helping. Denies any chest pain, shortness of breath or palpitations. Takes two tablets for sleep. Has been on Mydayis daily but was switched to PRN. [facility].  Vitamin D: She has been more consistent  with taking her medication. She is interested in having this rechecked today.   Hyperlipidemia: She has been taking her Rosuvastatin 5mg  two days a week. Lipid panel completed in August, 2023 showed that her cholesterol was slightly elevated. She has a follow up scheduled with cardiologist next week.     Patient Active Problem List   Diagnosis Date Noted   COVID-19 02/25/2022   Obesity (BMI 30.0-34.9) 02/25/2022   Chronic back pain 11/06/2021   GAD (generalized anxiety disorder) 04/18/2021   Vitamin D deficiency 04/18/2021   Hyperlipidemia associated with type 2 diabetes mellitus ([place]) 12/19/2020   Family history of sudden cardiac death Sep 12, 2020   Family history of pancreatic cancer 02/29/2020   IBS (irritable bowel syndrome) 09/13/2018   Type 2 diabetes mellitus with hyperglycemia ([place]) 09/13/2018   Mild intermittent asthma without complication 73/22/0254   Genital herpes simplex 09/10/2017   Chiari I malformation ([place]) 09/10/2017   Hyperlipidemia 09/10/2017   Chest discomfort 09/10/2017   Past Medical History:  Diagnosis Date   ADHD    Asthma    excercise induced   Chiari I malformation ([place])    COVID-19 virus infection 09/11/2021   Decreased fetal movement during pregnancy in third trimester, antepartum 12/05/2014   Decreased fetal movement in pregnancy in third trimester, antepartum 12/23/2014   Diabetes mellitus without complication (HCC)    Hyperlipidemia    IBS (irritable bowel syndrome)    Internal hemorrhoids    noted on colonoscopy from 2020   No pertinent past medical history  Vaginal Pap smear, abnormal    Past Surgical History:  Procedure Laterality Date   CESAREAN SECTION N/A 12/25/2014   Procedure: CESAREAN SECTION;  Surgeon: Azucena Fallen, MD;  Location: [facility];  Service: Obstetrics;  Laterality: N/A;   LAPAROSCOPY  03/24/2011   Procedure: LAPAROSCOPY DIAGNOSTIC;  Surgeon: Margarette Asal;  Location: [facility];  Service: Gynecology;  Laterality: N/A;    LASER ABLATION CONDOLAMATA N/A 06/16/2013   Procedure:  CO2 LASER ABLATION CONDOLAMATA;  Surgeon: Margarette Asal, MD;  Location: [facility];  Service: Gynecology;  Laterality: N/A;  CO2 LASER ABLATION OF VAIN & CIN   LEEP  08/11/2012   Procedure: LOOP ELECTROSURGICAL EXCISION PROCEDURE (LEEP);  Surgeon: Margarette Asal, MD;  Location: [facility];  Service: Gynecology;  Laterality: N/A;   WISDOM TOOTH EXTRACTION     Social History   Tobacco Use   Smoking status: Never   Smokeless tobacco: Never  Vaping Use   Vaping Use: Never used  Substance Use Topics   Alcohol use: No    Comment: socially   Drug use: No   Family History  Problem Relation Age of Onset   COPD Mother    Anxiety disorder Mother    Dementia Mother    Stroke Mother    Diabetes Father    Hypertension Father    Kidney disease Father    Allergies  Allergen Reactions   Metformin And Related Rash      Review of Systems  Constitutional:  Negative for diaphoresis.  Eyes:  Negative for blurred vision.  Respiratory:  Negative for cough.   Cardiovascular:  Negative for chest pain and leg swelling.  Gastrointestinal:  Negative for nausea and vomiting.  Genitourinary:  Negative for frequency and urgency.  Musculoskeletal:  Negative for myalgias.  Neurological:  Negative for dizziness and headaches.  Endo/Heme/Allergies:  Negative for polydipsia.      Objective:     BP 122/66   Pulse 89   Temp (!) 97.5 F (36.4 C) (Temporal)   Ht 5\' 2"  (1.575 m)   Wt 184 lb (83.5 kg)   SpO2 99%   BMI 33.65 kg/m  BP Readings from Last 3 Encounters:  09/02/22 122/66  03/17/22 102/68  01/19/22 100/60   Wt Readings from Last 3 Encounters:  09/02/22 184 lb (83.5 kg)  03/17/22 186 lb (84.4 kg)  02/25/22 186 lb 2 oz (84.4 kg)      Physical Exam Vitals and nursing note reviewed.  Cardiovascular:     Rate and Rhythm: Normal rate and regular rhythm.     Pulses: Normal pulses.     Heart sounds: Normal heart sounds.   Pulmonary:     Effort: Pulmonary effort is normal.     Breath sounds: Normal breath sounds.  Neurological:     Mental Status: She is oriented to person, place, and time.  Psychiatric:        Mood and Affect: Mood normal.        Behavior: Behavior normal.      Results for orders placed or performed in visit on 09/02/22  POCT glycosylated hemoglobin (Hb A1C)  Result Value Ref Range   Hemoglobin A1C 7.9 (A) 4.0 - 5.6 %   HbA1c POC (<> result, manual entry)     HbA1c, POC (prediabetic range)     HbA1c, POC (controlled diabetic range)         The ASCVD Risk score (Arnett DK, et al., 2019) failed to calculate for the following reasons:  The 2019 ASCVD risk score is only valid for ages 66 to 49    Assessment & Plan:   Problem List Items Addressed This Visit       Respiratory   Mild intermittent asthma without complication    Overall controlled.   Infrequent use of of Albuterol.         Digestive   IBS (irritable bowel syndrome)    Chronic with intermittent flares.   Denies any concerns after starting Mounjaro as ozempic was causing frequent flare ups.   Follows GI.         Endocrine   Type 2 diabetes mellitus with hyperglycemia ([place]) - Primary    Uncontrolled with A1C reading of 7.9 today.   Was switched from [place] to [facility]. Tolerated 2.5 mg weekly with no concern.   Continue Glipizide 10 mg by mouth daily. Continue Mounjaro 5 mg weekly.   Discussed with patient about monitoring her diet and importance of exercise.   Foot exam UTD, Urine ACR UTD, Eye exam UTD Declines Pneumonia vaccine Managed by Rosuvastatin.  Follow up in 3 months to check A1C. Follow up in 6 months in office.      Relevant Orders   POCT glycosylated hemoglobin (Hb A1C) (Completed)   CBC   Hyperlipidemia associated with type 2 diabetes mellitus ([place])    Cholesterol slightly elevated in August.   Discussed with patient about importance of medication adherence.   [facility] and has an appointment on Monday.   Repeat lipid panel pending.           Genitourinary   Genital herpes simplex    Infrequent outbreaks.   Continue Valacyclovir 500 mg PRN.         Other   Hyperlipidemia    Continue Rosuvastatin 5 mg daily.   Discussed with patient the importance of medication adherence.  Lipid panel pending.  CMP pending.      Relevant Orders   Comprehensive metabolic panel   Lipid panel   CBC   GAD (generalized anxiety disorder)    Overall controlled.   Continue Mydayis PRN and Hydroxyzine 10-20 mg by mouth as needed.  Followed by psychiatry.      Vitamin D deficiency    Uncontrolled. Has been more consistent with taking vitamin d.   Continue 5000 units of Vitamin D.  Repeat Vitamin D level pending.      Relevant Orders   VITAMIN D 25 Hydroxy (Vit-D Deficiency, Fractures)   Chronic back pain    Chronic and intermittent.   Continue cyclobenzaprine 5 mg PRN.        Return in about 6 months (around 03/03/2023) for diabetes follow up.    Tinnie Gens, BSN-RN, DNP STUDENT

## 2022-09-02 NOTE — Assessment & Plan Note (Addendum)
Chronic and intermittent.  No concerns today.  Continue cyclobenzaprine 5 mg PRN.   I evaluated patient, was consulted regarding treatment, and agree with assessment and plan per Tinnie Gens, RN, DNP student.   Allie Bossier, NP-C

## 2022-09-02 NOTE — Assessment & Plan Note (Addendum)
Infrequent outbreaks.   Continue Valacyclovir 500 mg PRN.   I evaluated patient, was consulted regarding treatment, and agree with assessment and plan per Tinnie Gens, RN, DNP student.   Allie Bossier, NP-C

## 2022-09-02 NOTE — Assessment & Plan Note (Addendum)
Controlled.   Continue albuterol inhaler PRN.  I evaluated patient, was consulted regarding treatment, and agree with assessment and plan per Tinnie Gens, RN, DNP student.   Allie Bossier, NP-C

## 2022-09-02 NOTE — Assessment & Plan Note (Addendum)
Overall controlled.   Continue Hydroxyzine 10-20 mg by mouth as needed.   I evaluated patient, was consulted regarding treatment, and agree with assessment and plan per Tinnie Gens, RN, DNP student.   Allie Bossier, NP-C

## 2022-09-02 NOTE — Assessment & Plan Note (Addendum)
Continue 5000 units of Vitamin D. Discussed daily compliance importance.   Repeat Vitamin D level pending.

## 2022-09-02 NOTE — Patient Instructions (Addendum)
Stop by the lab prior to leaving today. I will notify you of your results once received.   Set up a lab appointment for 3 months.  Please schedule a follow up visit for 6 months for a diabetes check.  It was a pleasure to see you today!

## 2022-09-02 NOTE — Progress Notes (Signed)
Subjective:    Patient ID: Brenda Valdez, female    DOB: 1985/12/15, 37 y.o.   MRN: [id number]  HPI  Brenda Valdez is a very pleasant 37 y.o. female with a history of type 2 diabetes, asthma, hyperlipidemia, GAD, vitamin D deficiency who presents today for follow up chronic conditions.  1) Type 2 Diabetes:  Current medications include: Glipizide XL 10 mg daily, Mounjaro 5 mg weekly. She took her first dose of Mounjaro last night.   She is checking her blood glucose continuously times daily and is getting readings of:  Evening: 140-160's AM fasting: high 100's, low 200's  Last A1C: 7.8 in August 2023, 7.9 today Last Eye Exam: UTD Last Foot Exam: UTD Urine Microalbumin: UTD Statin: rosuvastatin   Dietary changes since last visit: None. Trying to reduce sweet tea and increase water intake.    Exercise: None.  2) Asthma: Currently managed on albuterol inhaler PRN. Overall feels well managed. She uses her albuterol inhaler infrequently.   3) Genital Herpes: Currently managed on Valtrex 500 mg PRN. Overall outbreaks are infrequent.   4) GAD: Currently managed on Mydayis 375 mg daily and hydroxyzine 10-20 mg HS PRN. Following with psychiatry. Overall feels well managed on her current regimen.  5) Chronic Back Pain: Currently managed on cyclobenzaprine 5 mg PRN.   6) Hyperlipidemia: Currently managed on rosuvastatin for which she takes twice weekly on average. Last LDL was 126 in August 2023.  BP Readings from Last 3 Encounters:  09/02/22 122/66  03/17/22 102/68  01/19/22 100/60     Review of Systems  Eyes:  Negative for visual disturbance.  Respiratory:  Negative for shortness of breath and wheezing.   Cardiovascular:  Negative for chest pain.  Gastrointestinal:  Negative for nausea and vomiting.  Endocrine: Negative for polydipsia, polyphagia and polyuria.  Musculoskeletal:  Negative for myalgias.  Neurological:  Negative for dizziness and headaches.   Psychiatric/Behavioral:  The patient is not nervous/anxious.          Past Medical History:  Diagnosis Date   ADHD    Asthma    excercise induced   Chest discomfort 09/10/2017   Chiari I malformation (HCC)    COVID-19 02/25/2022   COVID-19 virus infection 09/11/2021   Decreased fetal movement during pregnancy in third trimester, antepartum 12/05/2014   Decreased fetal movement in pregnancy in third trimester, antepartum 12/23/2014   Diabetes mellitus without complication (HCC)    Hyperlipidemia    IBS (irritable bowel syndrome)    Internal hemorrhoids    noted on colonoscopy from 2020   No pertinent past medical history    Vaginal Pap smear, abnormal     Social History   Socioeconomic History   Marital status: Single    Spouse name: Not on file   Number of children: 1   Years of education: BA   Highest education level: Not on file  Occupational History   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[place]  Tobacco Use   Smoking status: Never   Smokeless tobacco: Never  Vaping Use   Vaping Use: Never used  Substance and Sexual Activity   Alcohol use: No    Comment: socially   Drug use: No   Sexual activity: Yes    Birth control/protection: None  Other Topics Concern   Not on file  Social History Narrative   Lives with mother and daughter   Caffeine use: Drinks 1/2 cup per day   Right handed    Social Determinants of Health  Financial Resource Strain: Not on file  Food Insecurity: Not on file  Transportation Needs: Not on file  Physical Activity: Not on file  Stress: Not on file  Social Connections: Not on file  Intimate Partner Violence: Not on file    Past Surgical History:  Procedure Laterality Date   CESAREAN SECTION N/A 12/25/2014   Procedure: CESAREAN SECTION;  Surgeon: Azucena Fallen, MD;  Location: [facility];  Service: Obstetrics;  Laterality: N/A;   LAPAROSCOPY  03/24/2011   Procedure: LAPAROSCOPY DIAGNOSTIC;  Surgeon: Margarette Asal;  Location: [facility];   Service: Gynecology;  Laterality: N/A;   LASER ABLATION CONDOLAMATA N/A 06/16/2013   Procedure:  CO2 LASER ABLATION CONDOLAMATA;  Surgeon: Margarette Asal, MD;  Location: [facility];  Service: Gynecology;  Laterality: N/A;  CO2 LASER ABLATION OF VAIN & CIN   LEEP  08/11/2012   Procedure: LOOP ELECTROSURGICAL EXCISION PROCEDURE (LEEP);  Surgeon: Margarette Asal, MD;  Location: [facility];  Service: Gynecology;  Laterality: N/A;   WISDOM TOOTH EXTRACTION      Family History  Problem Relation Age of Onset   COPD Mother    Anxiety disorder Mother    Dementia Mother    Stroke Mother    Diabetes Father    Hypertension Father    Kidney disease Father     Allergies  Allergen Reactions   Metformin And Related Rash    Current Outpatient Medications on File Prior to Visit  Medication Sig Dispense Refill   albuterol (VENTOLIN HFA) 108 (90 Base) MCG/ACT inhaler Inhale 1-2 puffs into the lungs every 6 (six) hours as needed for wheezing or shortness of breath. 8 g 0   Continuous Blood Gluc Sensor (FREESTYLE LIBRE 3 SENSOR) MISC 1 Units by Does not apply route 4 (four) times daily. Place 1 sensor on the skin every 14 days. 6 each 3   cyclobenzaprine (FLEXERIL) 5 MG tablet TAKE 1 TABLET BY MOUTH THREE TIMES A DAY AS NEEDED FOR MUSCLE SPASMS 30 tablet 0   etonogestrel (NEXPLANON) 68 MG IMPL implant 68 mg by Subdermal route once.     glipiZIDE (GLUCOTROL XL) 10 MG 24 hr tablet Take 1 tablet (10 mg total) by mouth daily with breakfast. for diabetes. 90 tablet 0   hydrOXYzine (ATARAX) 10 MG tablet TAKE 1-2 TABLETS (10-20 MG TOTAL) BY MOUTH AT BEDTIME AS NEEDED. FOR ANXIETY AND SLEEP. 180 tablet 0   MYDAYIS 37.5 MG CP24 Take 1 capsule by mouth every morning.     ondansetron (ZOFRAN ODT) 4 MG disintegrating tablet 4mg  ODT q4 hours prn nausea/vomit 12 tablet 0   pantoprazole (PROTONIX) 20 MG tablet Take 1 tablet (20 mg total) by mouth daily. 15 tablet 0   rosuvastatin (CRESTOR) 5 MG tablet Take 1 tablet (5 mg  total) by mouth daily. 90 tablet 3   tirzepatide (MOUNJARO) 5 MG/0.5ML Pen Inject 5 mg into the skin once a week. for diabetes. 2 mL 0   valACYclovir (VALTREX) 500 MG tablet Take 1 tablet (500 mg total) by mouth daily as needed (for outbreaks). 10 tablet 0   No current facility-administered medications on file prior to visit.    BP 122/66   Pulse 89   Temp (!) 97.5 F (36.4 C) (Temporal)   Ht 5\' 2"  (1.575 m)   Wt 184 lb (83.5 kg)   SpO2 99%   BMI 33.65 kg/m  Objective:   Physical Exam Cardiovascular:     Rate and Rhythm: Normal rate and regular rhythm.  Pulmonary:     Effort: Pulmonary effort is normal.     Breath sounds: Normal breath sounds.  Musculoskeletal:     Cervical back: Neck supple.  Skin:    General: Skin is warm and dry.  Neurological:     Mental Status: She is alert.  Psychiatric:        Mood and Affect: Mood normal.           Assessment & Plan:  Type 2 diabetes mellitus with hyperglycemia, without long-term current use of insulin (HCC) Assessment & Plan: Slightly worse with A1C reading of 7.9 today.  Would like to see A1C <7.  Continue Glipizide 10 mg by mouth daily. Continue Mounjaro 5 mg weekly, tolerating well.  Discussed with patient about monitoring her diet and importance of exercise.   Foot exam UTD, Urine ACR UTD, Eye exam UTD Declines Pneumonia vaccine Managed by Rosuvastatin.  Follow up in 3 months to check A1C. Follow up in 6 months in office.  I evaluated patient, was consulted regarding treatment, and agree with assessment and plan per Tinnie Gens, RN, DNP student.   Allie Bossier, NP-C   Orders: -     POCT glycosylated hemoglobin (Hb A1C) -     CBC  Hyperlipidemia, unspecified hyperlipidemia type Assessment & Plan: Continue Rosuvastatin 5 mg daily.   Discussed with patient the importance of medication adherence.  Lipid panel pending.  CMP pending.  I evaluated patient, was consulted regarding treatment, and agree with  assessment and plan per Tinnie Gens, RN, DNP student.   Allie Bossier, NP-C   Orders: -     Comprehensive metabolic panel -     Lipid panel -     CBC  Vitamin D deficiency Assessment & Plan: Continue 5000 units of Vitamin D. Discussed daily compliance importance.   Repeat Vitamin D level pending.  Orders: -     VITAMIN D 25 Hydroxy (Vit-D Deficiency, Fractures)  Mild intermittent asthma without complication Assessment & Plan: Controlled.   Continue albuterol inhaler PRN.  I evaluated patient, was consulted regarding treatment, and agree with assessment and plan per Tinnie Gens, RN, DNP student.   Allie Bossier, NP-C    Irritable bowel syndrome with both constipation and diarrhea Assessment & Plan: Chronic with intermittent flares.   Denies any concerns after starting Mounjaro as ozempic was causing frequent flare ups.   Follows GI.   I evaluated patient, was consulted regarding treatment, and agree with assessment and plan per Tinnie Gens, RN, DNP student.   Allie Bossier, NP-C    Hyperlipidemia associated with type 2 diabetes mellitus ([place]) Assessment & Plan: Repeat lipid panel pending.  Discussed with patient about importance of medication adherence.   I evaluated patient, was consulted regarding treatment, and agree with assessment and plan per Tinnie Gens, RN, DNP student.   Allie Bossier, NP-C     Genital herpes simplex, unspecified site Assessment & Plan: Infrequent outbreaks.   Continue Valacyclovir 500 mg PRN.   I evaluated patient, was consulted regarding treatment, and agree with assessment and plan per Tinnie Gens, RN, DNP student.   Allie Bossier, NP-C    GAD (generalized anxiety disorder) Assessment & Plan: Overall controlled.   Continue Hydroxyzine 10-20 mg by mouth as needed.   I evaluated patient, was consulted regarding treatment, and agree with assessment and plan per Tinnie Gens, RN, DNP student.   Allie Bossier,  NP-C    Chronic left-sided low back pain with left-sided sciatica Assessment & Plan: Chronic  and intermittent.  No concerns today.  Continue cyclobenzaprine 5 mg PRN.   I evaluated patient, was consulted regarding treatment, and agree with assessment and plan per Tinnie Gens, RN, DNP student.   Allie Bossier, NP-C          Pleas Koch, NP

## 2022-09-03 ENCOUNTER — Other Ambulatory Visit: Payer: 59

## 2022-09-03 DIAGNOSIS — E785 Hyperlipidemia, unspecified: Secondary | ICD-10-CM

## 2022-09-03 NOTE — Addendum Note (Signed)
Addended by: Ellamae Sia on: 09/03/2022 07:20 AM   Modules accepted: Orders

## 2022-09-07 ENCOUNTER — Ambulatory Visit: Payer: 59 | Attending: Internal Medicine | Admitting: Internal Medicine

## 2022-09-07 ENCOUNTER — Other Ambulatory Visit: Payer: Self-pay | Admitting: Primary Care

## 2022-09-07 ENCOUNTER — Encounter: Payer: Self-pay | Admitting: Internal Medicine

## 2022-09-07 VITALS — BP 120/70 | HR 98 | Ht 62.0 in | Wt 183.0 lb

## 2022-09-07 DIAGNOSIS — Z8241 Family history of sudden cardiac death: Secondary | ICD-10-CM | POA: Diagnosis not present

## 2022-09-07 DIAGNOSIS — E1165 Type 2 diabetes mellitus with hyperglycemia: Secondary | ICD-10-CM

## 2022-09-07 DIAGNOSIS — E1169 Type 2 diabetes mellitus with other specified complication: Secondary | ICD-10-CM | POA: Diagnosis not present

## 2022-09-07 DIAGNOSIS — E785 Hyperlipidemia, unspecified: Secondary | ICD-10-CM | POA: Diagnosis not present

## 2022-09-07 DIAGNOSIS — E7801 Familial hypercholesterolemia: Secondary | ICD-10-CM

## 2022-09-07 DIAGNOSIS — G8929 Other chronic pain: Secondary | ICD-10-CM

## 2022-09-07 MED ORDER — ROSUVASTATIN CALCIUM 5 MG PO TABS: 5.0000 mg | ORAL_TABLET | Freq: Every day | ORAL | 3 refills | Status: DC

## 2022-09-07 MED ORDER — CYCLOBENZAPRINE HCL 5 MG PO TABS: 5.0000 mg | ORAL_TABLET | Freq: Three times a day (TID) | ORAL | 0 refills | Status: DC | PRN

## 2022-09-07 NOTE — Telephone Encounter (Signed)
From: [name] To: Office of Pleas Koch, NP Sent: 09/07/2022 10:28 AM EST Subject: Medication Renewal Request  Refills have been requested for the following medications:   cyclobenzaprine (FLEXERIL) 5 MG tablet [Aneesha Holloran K Ereka Brau]  Preferred pharmacy: [id number] [address], [place] Delivery method: Arlyss Gandy

## 2022-09-07 NOTE — Patient Instructions (Signed)
Medication Instructions:  Your physician has recommended you make the following change in your medication:  RESTART: rosuvastatin (Crestor) 5 mg by mouth once daily at dinner time  *If you need a refill on your cardiac medications before your next appointment, please call your pharmacy*   Lab Work: IN 3 MONTHS: FLP, ALT (nothing to eat or drink 8-12 hours prior except for water and black coffee)  If you have labs (blood work) drawn today and your tests are completely normal, you will receive your results only by: [place] (if you have MyChart) OR A paper copy in the mail If you have any lab test that is abnormal or we need to change your treatment, we will call you to review the results.   Testing/Procedures: NONE   Follow-Up: At [facility], you and your health needs are our priority.  As part of our continuing mission to provide you with exceptional heart care, we have created designated Provider Care Teams.  These Care Teams include your primary Cardiologist (physician) and Advanced Practice Providers (APPs -  Physician Assistants and Nurse Practitioners) who all work together to provide you with the care you need, when you need it.  We recommend signing up for the patient portal called "MyChart".  Sign up information is provided on this After Visit Summary.  MyChart is used to connect with patients for Virtual Visits (Telemedicine).  Patients are able to view lab/test results, encounter notes, upcoming appointments, etc.  Non-urgent messages can be sent to your provider as well.   To learn more about what you can do with MyChart, go to [email or web address].    Your next appointment:   1 year(s)  Provider:   Werner Lean, MD

## 2022-09-07 NOTE — Progress Notes (Signed)
Cardiology Office Note:    Date:  09/07/2022   ID:  [name], DOB 1985-11-19, MRN [id number]  PCP:  Pleas Koch, NP  [facility] Cardiologist:  Werner Lean, MD  [facility] Electrophysiologist:  None   CC:  FH  History of Present Illness:    Brenda Valdez is a 37 y.o. female with a hx of T2DM with HLD who presents for evaluation 09/02/20. In interim of this visit, patient negative echo and stress echo.  Did not wish to start statin in 2023. 2023: missed appt.  Patient notes that she is doing ok  Since last visit notes that she started taking the rosuvastatin.  Life got busy and she stopped taking it (busy taking of her mom) .With her taking it has improvement in cholesterol (with a test that she got at work). There are no interval hospital/ED visit.   Work is going well  Rare chest pain that goes to her arm but is reproducible.    No SOB/DOe and no PND/Orthopnea.  No weight gain or leg swelling.  No palpitations or syncope . Her NP has drawn an Lp(a).    Past Medical History:  Diagnosis Date   ADHD    Asthma    excercise induced   Chest discomfort 09/10/2017   Chiari I malformation ([place])    COVID-19 02/25/2022   COVID-19 virus infection 09/11/2021   Decreased fetal movement during pregnancy in third trimester, antepartum 12/05/2014   Decreased fetal movement in pregnancy in third trimester, antepartum 12/23/2014   Diabetes mellitus without complication (HCC)    Hyperlipidemia    IBS (irritable bowel syndrome)    Internal hemorrhoids    noted on colonoscopy from 2020   No pertinent past medical history    Vaginal Pap smear, abnormal     Past Surgical History:  Procedure Laterality Date   CESAREAN SECTION N/A 12/25/2014   Procedure: CESAREAN SECTION;  Surgeon: Azucena Fallen, MD;  Location: [place] [facility];  Service: Obstetrics;  Laterality: N/A;   LAPAROSCOPY  03/24/2011   Procedure: LAPAROSCOPY DIAGNOSTIC;  Surgeon: Margarette Asal;  Location: [place]  [facility];  Service: Gynecology;  Laterality: N/A;   LASER ABLATION CONDOLAMATA N/A 06/16/2013   Procedure:  CO2 LASER ABLATION CONDOLAMATA;  Surgeon: Margarette Asal, MD;  Location: [place] [facility];  Service: Gynecology;  Laterality: N/A;  CO2 LASER ABLATION OF VAIN & CIN   LEEP  08/11/2012   Procedure: LOOP ELECTROSURGICAL EXCISION PROCEDURE (LEEP);  Surgeon: Margarette Asal, MD;  Location: [place] [facility];  Service: Gynecology;  Laterality: N/A;   WISDOM TOOTH EXTRACTION      Current Medications: Current Meds  Medication Sig   albuterol (VENTOLIN HFA) 108 (90 Base) MCG/ACT inhaler Inhale 1-2 puffs into the lungs every 6 (six) hours as needed for wheezing or shortness of breath.   Continuous Blood Gluc Sensor (FREESTYLE LIBRE 3 SENSOR) MISC 1 Units by Does not apply route 4 (four) times daily. Place 1 sensor on the skin every 14 days.   cyclobenzaprine (FLEXERIL) 5 MG tablet Take 1 tablet (5 mg total) by mouth 3 (three) times daily as needed for muscle spasms.   etonogestrel (NEXPLANON) 68 MG IMPL implant 68 mg by Subdermal route once.   glipiZIDE (GLUCOTROL XL) 10 MG 24 hr tablet Take 1 tablet (10 mg total) by mouth daily with breakfast. for diabetes.   hydrOXYzine (ATARAX) 10 MG tablet TAKE 1-2 TABLETS (10-20 MG TOTAL) BY MOUTH AT BEDTIME AS NEEDED. FOR ANXIETY AND  SLEEP.   MYDAYIS 37.5 MG CP24 Take 1 capsule by mouth every morning.   ondansetron (ZOFRAN ODT) 4 MG disintegrating tablet 4mg  ODT q4 hours prn nausea/vomit   pantoprazole (PROTONIX) 20 MG tablet Take 1 tablet (20 mg total) by mouth daily.   tirzepatide [facility]) 5 MG/0.5ML Pen Inject 5 mg into the skin once a week. for diabetes.   valACYclovir (VALTREX) 500 MG tablet Take 1 tablet (500 mg total) by mouth daily as needed (for outbreaks).   [DISCONTINUED] rosuvastatin (CRESTOR) 5 MG tablet Take 1 tablet (5 mg total) by mouth daily.     Allergies:   Metformin and related   Social History   Socioeconomic History   Marital status: Single     Spouse name: Not on file   Number of children: 1   Years of education: BA   Highest education level: Not on file  Occupational History   Occupation: [employer]  Tobacco Use   Smoking status: Never   Smokeless tobacco: Never  Vaping Use   Vaping Use: Never used  Substance and Sexual Activity   Alcohol use: No    Comment: socially   Drug use: No   Sexual activity: Yes    Birth control/protection: None  Other Topics Concern   Not on file  Social History Narrative   Lives with mother and daughter   Caffeine use: Drinks 1/2 cup per day   Right handed    Social Determinants of Health   Financial Resource Strain: Not on file  Food Insecurity: Not on file  Transportation Needs: Not on file  Physical Activity: Not on file  Stress: Not on file  Social Connections: Not on file    SOCIAL: Works as a Audiological scientist; also a mom to a  ~ 76 year old girl.  Family History: The patient's family history includes Anxiety disorder in her mother; COPD in her mother; Dementia in her mother; Diabetes in her father; Hypertension in her father; Kidney disease in her father; Stroke in her mother. History of coronary artery disease notable for father, maternal aunt. History of heart failure notable for no members. No history of cardiomyopathies including hypertrophic cardiomyopathy, left ventricular non-compaction, or arrhythmogenic right ventricular cardiomyopathy History of arrhythmia notable for no members. Family history of sudden cardiac death in uncle and cousin (cousin died suddenly on the basketball court) No history of bicuspid aortic valve or aortic aneurysm or dissection.  ROS:   Please see the history of present illness.    All other systems reviewed and are negative.  EKGs/Labs/Other Studies Reviewed:    The following studies were reviewed today:  EKG:   09/07/2022: SR rate 73 08/05/2020: SR 96 WNL  Transthoracic Echocardiogram: Date: 10/21/20 Results: 1. Left ventricular  ejection fraction, by estimation, is 55 to 60%. Left  ventricular ejection fraction by 3D volume is 65 %. The left ventricle has  normal function. The left ventricle has no regional wall motion  abnormalities. Left ventricular diastolic   parameters were normal.   2. Right ventricular systolic function is normal. The right ventricular  size is normal.   3. The mitral valve is normal in structure. Trivial mitral valve  regurgitation. No evidence of mitral stenosis.   4. The aortic valve is normal in structure. Aortic valve regurgitation is  not visualized. No aortic stenosis is present.   5. The inferior vena cava is normal in size with greater than 50%  respiratory variability, suggesting right atrial pressure of 3 mmHg.  Stress Echo: Date: 10/21/20 Results: 2D Echo Findings: Baseline regional wall motion abnormalities were not  present. There were no stress-induced wall motion abnormalities. This is a  negative stress echocardiogram for ischemia.    1. This is a negative stress echocardiogram for ischemia.   2. This is a low risk study.   Recent Labs: 03/17/2022: TSH 1.15 09/02/2022: ALT 16; BUN 12; Creatinine, Ser 0.84; Hemoglobin 12.6; Platelets 353.0; Potassium 4.0; Sodium 135  Recent Lipid Panel    Component Value Date/Time   CHOL 248 (H) 09/02/2022 1011   CHOL 241 (H) 03/20/2021 0747   TRIG 94.0 09/02/2022 1011   HDL 39.60 09/02/2022 1011   HDL 34 (L) 03/20/2021 0747   CHOLHDL 6 09/02/2022 1011   VLDL 18.8 09/02/2022 1011   LDLCALC 190 (H) 09/02/2022 1011   LDLCALC 182 (H) 03/20/2021 0747     Physical Exam:    VS:  BP 120/70   Pulse 98   Ht 5\' 2"  (1.575 m)   Wt 183 lb (83 kg)   SpO2 (!) 73%   BMI 33.47 kg/m     Wt Readings from Last 3 Encounters:  09/07/22 183 lb (83 kg)  09/02/22 184 lb (83.5 kg)  03/17/22 186 lb (84.4 kg)   THIS IS EXAM FROM LAST VISIT.  PATIENT DID NOT SHOW UP.  GEN:  Well nourished, well developed in no acute distress HEENT:  Normal NECK: No JVD; No carotid bruits LYMPHATICS: No lymphadenopathy CARDIAC: RRR, II /IV systolic ejection murmur no rubs or gallops RESPIRATORY:  Clear to auscultation without rales, wheezing or rhonchi  ABDOMEN: Soft, non-tender, non-distended MUSCULOSKELETAL:  No edema; No deformity  SKIN: Warm and dry NEUROLOGIC:  Alert and oriented x 3 PSYCHIATRIC:  Normal affect   ASSESSMENT:    1. Familial hypercholesterolemia   2. Hyperlipidemia associated with type 2 diabetes mellitus (HCC)   3. Family history of sudden cardiac death   4. Type 2 diabetes mellitus with hyperglycemia, without long-term current use of insulin (HCC)     PLAN:    HLD with DM (FH) Family history of SCD in cousin - Consider echo in 2027 for follow up given SCD NOS in family (2nd degree member) - LDL goal less than 100 - Lp(a) pending - will restart statin and labs in three months  One year or PRN   Medication Adjustments/Labs and Tests Ordered: Current medicines are reviewed at length with the patient today.  Concerns regarding medicines are outlined above.  Orders Placed This Encounter  Procedures   ALT   Lipid panel   EKG 12-Lead   Meds ordered this encounter  Medications   rosuvastatin (CRESTOR) 5 MG tablet    Sig: Take 1 tablet (5 mg total) by mouth daily.    Dispense:  90 tablet    Refill:  3    Patient Instructions  Medication Instructions:  Your physician has recommended you make the following change in your medication:  RESTART: rosuvastatin (Crestor) 5 mg by mouth once daily at dinner time  *If you need a refill on your cardiac medications before your next appointment, please call your pharmacy*   Lab Work: IN 3 MONTHS: FLP, ALT (nothing to eat or drink 8-12 hours prior except for water and black coffee)  If you have labs (blood work) drawn today and your tests are completely normal, you will receive your results only by: MyChart Message (if you have MyChart) OR A paper copy in  the mail If you have any  lab test that is abnormal or we need to change your treatment, we will call you to review the results.   Testing/Procedures: NONE   Follow-Up: At [facility], you and your health needs are our priority.  As part of our continuing mission to provide you with exceptional heart care, we have created designated Provider Care Teams.  These Care Teams include your primary Cardiologist (physician) and Advanced Practice Providers (APPs -  Physician Assistants and Nurse Practitioners) who all work together to provide you with the care you need, when you need it.  We recommend signing up for the patient portal called "MyChart".  Sign up information is provided on this After Visit Summary.  MyChart is used to connect with patients for Virtual Visits (Telemedicine).  Patients are able to view lab/test results, encounter notes, upcoming appointments, etc.  Non-urgent messages can be sent to your provider as well.   To learn more about what you can do with MyChart, go to [email or web address].    Your next appointment:   1 year(s)  Provider:   Werner Lean, MD       Signed, Werner Lean, MD  09/07/2022 4:07 PM    [place]

## 2022-09-08 ENCOUNTER — Other Ambulatory Visit: Payer: Self-pay | Admitting: Primary Care

## 2022-09-08 DIAGNOSIS — E1165 Type 2 diabetes mellitus with hyperglycemia: Secondary | ICD-10-CM

## 2022-09-09 ENCOUNTER — Encounter: Payer: Self-pay | Admitting: Internal Medicine

## 2022-09-09 LAB — LIPOPROTEIN A (LPA): Lipoprotein (a): 217 nmol/L — ABNORMAL HIGH (ref <75)

## 2022-09-12 ENCOUNTER — Other Ambulatory Visit: Payer: Self-pay | Admitting: Primary Care

## 2022-09-12 DIAGNOSIS — E1165 Type 2 diabetes mellitus with hyperglycemia: Secondary | ICD-10-CM

## 2022-09-20 DIAGNOSIS — E119 Type 2 diabetes mellitus without complications: Secondary | ICD-10-CM

## 2022-09-21 MED ORDER — FREESTYLE LIBRE 3 SENSOR MISC: 3 refills | Status: AC

## 2022-11-02 ENCOUNTER — Other Ambulatory Visit: Payer: Self-pay | Admitting: Primary Care

## 2022-11-02 DIAGNOSIS — F411 Generalized anxiety disorder: Secondary | ICD-10-CM

## 2022-11-09 ENCOUNTER — Other Ambulatory Visit: Payer: Self-pay | Admitting: Primary Care

## 2022-11-30 ENCOUNTER — Other Ambulatory Visit (INDEPENDENT_AMBULATORY_CARE_PROVIDER_SITE_OTHER): Payer: 59

## 2022-11-30 ENCOUNTER — Other Ambulatory Visit: Payer: Self-pay | Admitting: Primary Care

## 2022-11-30 ENCOUNTER — Other Ambulatory Visit: Payer: Self-pay | Admitting: Nurse Practitioner

## 2022-11-30 DIAGNOSIS — E785 Hyperlipidemia, unspecified: Secondary | ICD-10-CM | POA: Diagnosis not present

## 2022-11-30 DIAGNOSIS — E1169 Type 2 diabetes mellitus with other specified complication: Secondary | ICD-10-CM

## 2022-11-30 DIAGNOSIS — E1165 Type 2 diabetes mellitus with hyperglycemia: Secondary | ICD-10-CM

## 2022-11-30 DIAGNOSIS — E559 Vitamin D deficiency, unspecified: Secondary | ICD-10-CM | POA: Diagnosis not present

## 2022-11-30 NOTE — Addendum Note (Signed)
Addended by: Alvina Chou on: 11/30/2022 10:09 AM   Modules accepted: Orders

## 2022-11-30 NOTE — Addendum Note (Signed)
Addended by: Alvina Chou on: 11/30/2022 08:53 AM   Modules accepted: Orders

## 2022-12-01 ENCOUNTER — Other Ambulatory Visit: Payer: Self-pay | Admitting: Primary Care

## 2022-12-01 DIAGNOSIS — E1165 Type 2 diabetes mellitus with hyperglycemia: Secondary | ICD-10-CM

## 2022-12-01 DIAGNOSIS — E559 Vitamin D deficiency, unspecified: Secondary | ICD-10-CM

## 2022-12-01 LAB — VITAMIN D 25 HYDROXY (VIT D DEFICIENCY, FRACTURES): VITD: 32.01 ng/mL (ref 30.00–100.00)

## 2022-12-01 NOTE — Addendum Note (Signed)
Addended by: Alvina Chou on: 12/01/2022 07:24 AM   Modules accepted: Orders

## 2022-12-02 ENCOUNTER — Ambulatory Visit: Payer: 59 | Attending: Internal Medicine

## 2022-12-02 DIAGNOSIS — E1165 Type 2 diabetes mellitus with hyperglycemia: Secondary | ICD-10-CM

## 2022-12-02 DIAGNOSIS — E1169 Type 2 diabetes mellitus with other specified complication: Secondary | ICD-10-CM

## 2022-12-02 LAB — HEMOGLOBIN A1C

## 2022-12-02 LAB — LIPID PANEL: Cholesterol, Total: 151 mg/dL (ref 100–199)

## 2022-12-03 LAB — LIPID PANEL
Chol/HDL Ratio: 5 ratio — ABNORMAL HIGH (ref 0.0–4.4)
HDL: 30 mg/dL — ABNORMAL LOW (ref >39)
LDL Chol Calc (NIH): 100 mg/dL — ABNORMAL HIGH (ref 0–99)
Triglycerides: 113 mg/dL (ref 0–149)
VLDL Cholesterol Cal: 21 mg/dL (ref 5–40)

## 2022-12-03 LAB — HEMOGLOBIN A1C: Est. average glucose Bld gHb Est-mCnc: 171 mg/dL

## 2022-12-03 LAB — ALT: ALT: 13 IU/L (ref 0–32)

## 2022-12-08 ENCOUNTER — Encounter: Payer: Self-pay | Admitting: Internal Medicine

## 2022-12-08 DIAGNOSIS — E7801 Familial hypercholesterolemia: Secondary | ICD-10-CM

## 2022-12-08 DIAGNOSIS — E1169 Type 2 diabetes mellitus with other specified complication: Secondary | ICD-10-CM

## 2022-12-09 MED ORDER — ROSUVASTATIN CALCIUM 10 MG PO TABS: 10.0000 mg | ORAL_TABLET | Freq: Every day | ORAL | 3 refills | Status: DC

## 2022-12-10 DIAGNOSIS — E1165 Type 2 diabetes mellitus with hyperglycemia: Secondary | ICD-10-CM

## 2022-12-14 ENCOUNTER — Other Ambulatory Visit: Payer: Self-pay | Admitting: Primary Care

## 2022-12-14 DIAGNOSIS — G8929 Other chronic pain: Secondary | ICD-10-CM

## 2022-12-14 NOTE — Telephone Encounter (Signed)
From: [name] To: Office of [name], NP Sent: 12/14/2022 2:06 PM EDT Subject: Medication Renewal Request  Refills have been requested for the following medications:   cyclobenzaprine (FLEXERIL) 5 MG tablet [Shaul Trautman K Emeril Stille]  Preferred pharmacy: [id number] [address], [place] [phone] Delivery method: Daryll Drown

## 2022-12-14 NOTE — Telephone Encounter (Signed)
Rx already sent to pharmacy.

## 2022-12-29 ENCOUNTER — Encounter: Payer: Self-pay | Admitting: Primary Care

## 2023-01-08 MED ORDER — TIRZEPATIDE 7.5 MG/0.5ML ~~LOC~~ SOAJ
7.5000 mg | SUBCUTANEOUS | 0 refills | Status: DC
Start: 2023-01-08 — End: 2023-04-13

## 2023-02-02 ENCOUNTER — Other Ambulatory Visit: Payer: Self-pay | Admitting: Primary Care

## 2023-02-02 DIAGNOSIS — F411 Generalized anxiety disorder: Secondary | ICD-10-CM

## 2023-03-03 ENCOUNTER — Encounter: Payer: Self-pay | Admitting: Primary Care

## 2023-03-03 ENCOUNTER — Ambulatory Visit: Payer: 59 | Admitting: Primary Care

## 2023-03-03 VITALS — BP 106/62 | HR 85 | Temp 97.2°F | Ht 62.0 in | Wt 182.0 lb

## 2023-03-03 DIAGNOSIS — E1165 Type 2 diabetes mellitus with hyperglycemia: Secondary | ICD-10-CM

## 2023-03-03 DIAGNOSIS — F411 Generalized anxiety disorder: Secondary | ICD-10-CM

## 2023-03-03 DIAGNOSIS — M5442 Lumbago with sciatica, left side: Secondary | ICD-10-CM | POA: Diagnosis not present

## 2023-03-03 DIAGNOSIS — Z7985 Long-term (current) use of injectable non-insulin antidiabetic drugs: Secondary | ICD-10-CM

## 2023-03-03 DIAGNOSIS — G8929 Other chronic pain: Secondary | ICD-10-CM

## 2023-03-03 LAB — MICROALBUMIN / CREATININE URINE RATIO
Creatinine,U: 75.9 mg/dL
Microalb Creat Ratio: 0.9 mg/g (ref 0.0–30.0)
Microalb, Ur: <0.7 mg/dL (ref 0.0–1.9)

## 2023-03-03 LAB — POCT GLYCOSYLATED HEMOGLOBIN (HGB A1C): Hemoglobin A1C: 7 % — AB (ref 4.0–5.6)

## 2023-03-03 MED ORDER — CYCLOBENZAPRINE HCL 5 MG PO TABS
5.0000 mg | ORAL_TABLET | Freq: Every day | ORAL | 0 refills | Status: DC | PRN
Start: 2023-03-03 — End: 2023-04-05

## 2023-03-03 MED ORDER — TRAZODONE HCL 50 MG PO TABS
50.0000 mg | ORAL_TABLET | Freq: Every day | ORAL | 0 refills | Status: DC
Start: 2023-03-03 — End: 2023-05-30

## 2023-03-03 NOTE — Patient Instructions (Signed)
Stop by the lab prior to leaving today. I will notify you of your results once received.   Start trazodone 50 mg at bedtime for sleep.  Continue glipizide Mounjaro as discussed.  Please schedule a physical to meet with me in 6 months.   It was a pleasure to see you today!

## 2023-03-03 NOTE — Assessment & Plan Note (Signed)
Continued, mostly at night.  Start trazodone 50 mg at bedtime as hydroxyzine has been ineffective. Proceed with therapy as scheduled.  Continue to monitor.

## 2023-03-03 NOTE — Progress Notes (Signed)
Subjective:    Patient ID: Brenda Valdez, female    DOB: 1986/05/18, 37 y.o.   MRN: [id number]  HPI  Brenda Valdez is a very pleasant 37 y.o. female with a history of type 2 diabetes, vitamin D deficiency, hyperlipidemia, asthma, GAD who presents today for follow up of diabetes and anxiety.  She is also requesting refill of her cyclobenzaprine for chronic back pain.  1) Type 2 Diabetes:  Current medications include: glipizide XL 10 mg daily, Mounjaro 7.5 mg weekly   She is checking her blood glucose continuously and is getting readings ranging:  AM fasting: mid to 100's Before lunch: 150 2 hours after lunch: 130-150  She has dropped into the 50's and 60's during the night and once during the day.   Last A1C: 7.9 in January 2024, 7.0 today Last Eye Exam: Due Last Foot Exam:  Pneumonia Vaccination: Never completed Urine Microalbumin: Due Statin: Rosuvastatin   Dietary changes since last visit: Decreased appetite. Is not eating solid food often. Mostly protein shakes or smoothies.    Exercise: Active at work.   2) GAD: Chronic. Currently not managed on daily treatment. She lost her mother a few months ago. She recently signed up for therapy through Hospice services. Symptoms include difficulty sleeping, irritability, feeling uneasy, feeling overwhelmed.   Overall she feels that her anxiety during the day while at work is better and manageable, but she has difficulty falling asleep at night.   She is currently taking hydroxyzine 10-20 mg HS which hasn't helped.   BP Readings from Last 3 Encounters:  03/03/23 106/62  09/07/22 120/70  09/02/22 122/66   Wt Readings from Last 3 Encounters:  03/03/23 182 lb (82.6 kg)  09/07/22 183 lb (83 kg)  09/02/22 184 lb (83.5 kg)      Review of Systems  Respiratory:  Negative for shortness of breath.   Cardiovascular:  Negative for chest pain.  Neurological:  Negative for numbness.  Psychiatric/Behavioral:  Positive for sleep  disturbance. The patient is nervous/anxious.          Past Medical History:  Diagnosis Date   ADHD    Asthma    excercise induced   Chest discomfort 09/10/2017   Chiari I malformation (HCC)    COVID-19 02/25/2022   COVID-19 virus infection 09/11/2021   Decreased fetal movement during pregnancy in third trimester, antepartum 12/05/2014   Decreased fetal movement in pregnancy in third trimester, antepartum 12/23/2014   Diabetes mellitus without complication (HCC)    Hyperlipidemia    IBS (irritable bowel syndrome)    Internal hemorrhoids    noted on colonoscopy from 2020   No pertinent past medical history    Vaginal Pap smear, abnormal     Social History   Socioeconomic History   Marital status: Single    Spouse name: Not on file   Number of children: 1   Years of education: BA   Highest education level: Bachelor's degree (e.g., BA, AB, BS)  Occupational History   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[place]  Tobacco Use   Smoking status: Never   Smokeless tobacco: Never  Vaping Use   Vaping status: Never Used  Substance and Sexual Activity   Alcohol use: No    Comment: socially   Drug use: No   Sexual activity: Yes    Birth control/protection: None  Other Topics Concern   Not on file  Social History Narrative   Lives with mother and daughter   Caffeine use: Drinks  1/2 cup per day   Right handed    Social Determinants of Health   Financial Resource Strain: Low Risk  (03/02/2023)   Overall Financial Resource Strain (CARDIA)    Difficulty of Paying Living Expenses: Not very hard  Food Insecurity: No Food Insecurity (03/02/2023)   Hunger Vital Sign    Worried About Running Out of Food in the Last Year: Never true    Ran Out of Food in the Last Year: Never true  Transportation Needs: No Transportation Needs (03/02/2023)   PRAPARE - Administrator, Civil Service (Medical): No    Lack of Transportation (Non-Medical): No  Physical Activity: Unknown (03/02/2023)    Exercise Vital Sign    Days of Exercise per Week: 0 days    Minutes of Exercise per Session: Not on file  Stress: No Stress Concern Present (03/02/2023)   [employer] of Occupational Health - Occupational Stress Questionnaire    Feeling of Stress : Only a little  Social Connections: Socially Isolated (03/02/2023)   Social Connection and Isolation Panel [NHANES]    Frequency of Communication with Friends and Family: More than three times a week    Frequency of Social Gatherings with Friends and Family: Once a week    Attends Religious Services: Never    Database administrator or Organizations: No    Attends Engineer, structural: Not on file    Marital Status: Never married  Intimate Partner Violence: Unknown (11/05/2021)   Received from [employer], [facility]   HITS    Physically Hurt: Not on file    Insult or Talk Down To: Not on file    Threaten Physical Harm: Not on file    Scream or Curse: Not on file    Past Surgical History:  Procedure Laterality Date   CESAREAN SECTION N/A 12/25/2014   Procedure: CESAREAN SECTION;  Surgeon: Shea Evans, MD;  Location: [facility];  Service: Obstetrics;  Laterality: N/A;   LAPAROSCOPY  03/24/2011   Procedure: LAPAROSCOPY DIAGNOSTIC;  Surgeon: Meriel Pica;  Location: [facility];  Service: Gynecology;  Laterality: N/A;   LASER ABLATION CONDOLAMATA N/A 06/16/2013   Procedure:  CO2 LASER ABLATION CONDOLAMATA;  Surgeon: Meriel Pica, MD;  Location: [facility];  Service: Gynecology;  Laterality: N/A;  CO2 LASER ABLATION OF VAIN & CIN   LEEP  08/11/2012   Procedure: LOOP ELECTROSURGICAL EXCISION PROCEDURE (LEEP);  Surgeon: Meriel Pica, MD;  Location: [facility];  Service: Gynecology;  Laterality: N/A;   WISDOM TOOTH EXTRACTION      Family History  Problem Relation Age of Onset   COPD Mother    Anxiety disorder Mother    Dementia Mother    Stroke Mother    Diabetes Father    Hypertension Father    Kidney disease Father      Allergies  Allergen Reactions   Metformin And Related Rash    Current Outpatient Medications on File Prior to Visit  Medication Sig Dispense Refill   albuterol (VENTOLIN HFA) 108 (90 Base) MCG/ACT inhaler Inhale 1-2 puffs into the lungs every 6 (six) hours as needed for wheezing or shortness of breath. 8 g 0   Continuous Blood Gluc Sensor (FREESTYLE LIBRE 3 SENSOR) MISC Place 1 sensor on the skin every 14 days. 6 each 3   etonogestrel (NEXPLANON) 68 MG IMPL implant 68 mg by Subdermal route once.     glipiZIDE (GLUCOTROL XL) 10 MG 24 hr tablet TAKE 1 TABLET (  10 MG TOTAL) BY MOUTH DAILY WITH BREAKFAST. FOR DIABETES. 90 tablet 1   hydrOXYzine (ATARAX) 10 MG tablet TAKE 1-2 TABLETS (10-20 MG TOTAL) BY MOUTH AT BEDTIME AS NEEDED. FOR ANXIETY AND SLEEP. 180 tablet 0   MYDAYIS 37.5 MG CP24 Take 1 capsule by mouth every morning.     ondansetron (ZOFRAN ODT) 4 MG disintegrating tablet 4mg  ODT q4 hours prn nausea/vomit 12 tablet 0   pantoprazole (PROTONIX) 20 MG tablet Take 1 tablet (20 mg total) by mouth daily. 15 tablet 0   rosuvastatin (CRESTOR) 10 MG tablet Take 1 tablet (10 mg total) by mouth daily. 90 tablet 3   tirzepatide (MOUNJARO) 7.5 MG/0.5ML Pen Inject 7.5 mg into the skin once a week. for diabetes. 6 mL 0   valACYclovir (VALTREX) 500 MG tablet Take 1 tablet (500 mg total) by mouth daily as needed (for outbreaks). 10 tablet 0   No current facility-administered medications on file prior to visit.    BP 106/62   Pulse 85   Temp (!) 97.2 F (36.2 C) (Temporal)   Ht 5\' 2"  (1.575 m)   Wt 182 lb (82.6 kg)   SpO2 98%   BMI 33.29 kg/m  Objective:   Physical Exam Cardiovascular:     Rate and Rhythm: Normal rate and regular rhythm.  Pulmonary:     Effort: Pulmonary effort is normal.     Breath sounds: Normal breath sounds.  Musculoskeletal:     Cervical back: Neck supple.  Skin:    General: Skin is warm and dry.           Assessment & Plan:  Type 2 diabetes mellitus  with hyperglycemia, without long-term current use of insulin (HCC) Assessment & Plan: Improved with A1C of 7.0 today!  Continue Glipizide XL 10 mg daily for now. She will start working on eating more during the day. Continue Mounjaro 7.5 mg weekly.  Urine microalbumin pending. Foot exam today.  Follow up in 6 months.  Orders: -     POCT glycosylated hemoglobin (Hb A1C) -     Microalbumin / creatinine urine ratio  Chronic left-sided low back pain with left-sided sciatica -     Cyclobenzaprine HCl; Take 1 tablet (5 mg total) by mouth daily as needed for muscle spasms.  Dispense: 30 tablet; Refill: 0  GAD (generalized anxiety disorder) Assessment & Plan: Continued, mostly at night.  Start trazodone 50 mg at bedtime as hydroxyzine has been ineffective. Proceed with therapy as scheduled.  Continue to monitor.  Orders: -     traZODone HCl; Take 1 tablet (50 mg total) by mouth at bedtime. For sleep  Dispense: 90 tablet; Refill: 0        [name], NP

## 2023-03-03 NOTE — Assessment & Plan Note (Addendum)
Improved with A1C of 7.0 today!  Continue Glipizide XL 10 mg daily for now. She will start working on eating more during the day. Continue Mounjaro 7.5 mg weekly.  Urine microalbumin pending. Foot exam today.  Follow up in 6 months.

## 2023-03-10 ENCOUNTER — Ambulatory Visit: Payer: 59 | Attending: Cardiology

## 2023-03-10 DIAGNOSIS — E7801 Familial hypercholesterolemia: Secondary | ICD-10-CM

## 2023-03-10 DIAGNOSIS — E1169 Type 2 diabetes mellitus with other specified complication: Secondary | ICD-10-CM

## 2023-03-30 ENCOUNTER — Other Ambulatory Visit: Payer: Self-pay | Admitting: Primary Care

## 2023-03-30 DIAGNOSIS — E1165 Type 2 diabetes mellitus with hyperglycemia: Secondary | ICD-10-CM

## 2023-04-05 ENCOUNTER — Other Ambulatory Visit: Payer: Self-pay | Admitting: Primary Care

## 2023-04-05 DIAGNOSIS — M5442 Lumbago with sciatica, left side: Secondary | ICD-10-CM

## 2023-04-13 ENCOUNTER — Other Ambulatory Visit: Payer: Self-pay | Admitting: Primary Care

## 2023-04-13 DIAGNOSIS — E1165 Type 2 diabetes mellitus with hyperglycemia: Secondary | ICD-10-CM

## 2023-05-17 ENCOUNTER — Other Ambulatory Visit: Payer: Self-pay | Admitting: Primary Care

## 2023-05-17 DIAGNOSIS — G8929 Other chronic pain: Secondary | ICD-10-CM

## 2023-05-29 ENCOUNTER — Other Ambulatory Visit: Payer: Self-pay | Admitting: Primary Care

## 2023-05-29 DIAGNOSIS — F411 Generalized anxiety disorder: Secondary | ICD-10-CM

## 2023-06-27 ENCOUNTER — Emergency Department (HOSPITAL_COMMUNITY)
Admission: EM | Admit: 2023-06-27 | Discharge: 2023-06-27 | Disposition: A | Discharge status: Home / Self Care | Payer: 59 | Attending: Emergency Medicine | Admitting: Emergency Medicine

## 2023-06-27 ENCOUNTER — Encounter (HOSPITAL_COMMUNITY): Payer: Self-pay

## 2023-06-27 ENCOUNTER — Emergency Department (HOSPITAL_COMMUNITY): Payer: 59

## 2023-06-27 ENCOUNTER — Other Ambulatory Visit: Payer: Self-pay

## 2023-06-27 DIAGNOSIS — D72829 Elevated white blood cell count, unspecified: Secondary | ICD-10-CM | POA: Diagnosis not present

## 2023-06-27 DIAGNOSIS — R1033 Periumbilical pain: Secondary | ICD-10-CM | POA: Diagnosis not present

## 2023-06-27 DIAGNOSIS — R112 Nausea with vomiting, unspecified: Secondary | ICD-10-CM | POA: Diagnosis not present

## 2023-06-27 DIAGNOSIS — R109 Unspecified abdominal pain: Secondary | ICD-10-CM | POA: Diagnosis present

## 2023-06-27 DIAGNOSIS — R110 Nausea: Secondary | ICD-10-CM

## 2023-06-27 DIAGNOSIS — R000 Tachycardia, unspecified: Secondary | ICD-10-CM | POA: Diagnosis not present

## 2023-06-27 DIAGNOSIS — R1032 Left lower quadrant pain: Secondary | ICD-10-CM | POA: Insufficient documentation

## 2023-06-27 LAB — COMPREHENSIVE METABOLIC PANEL
ALT: 19 U/L (ref 0–44)
AST: 21 U/L (ref 15–41)
Albumin: 4.6 g/dL (ref 3.5–5.0)
Alkaline Phosphatase: 67 U/L (ref 38–126)
Anion gap: 9 (ref 5–15)
BUN: 16 mg/dL (ref 6–20)
CO2: 21 mmol/L — ABNORMAL LOW (ref 22–32)
Calcium: 9.4 mg/dL (ref 8.9–10.3)
Chloride: 104 mmol/L (ref 98–111)
Creatinine, Ser: 0.79 mg/dL (ref 0.44–1.00)
GFR, Estimated: >60 mL/min (ref >60)
Glucose, Bld: 176 mg/dL — ABNORMAL HIGH (ref 70–99)
Potassium: 4 mmol/L (ref 3.5–5.1)
Sodium: 134 mmol/L — ABNORMAL LOW (ref 135–145)
Total Bilirubin: 0.7 mg/dL (ref <1.2)
Total Protein: 8.9 g/dL — ABNORMAL HIGH (ref 6.5–8.1)

## 2023-06-27 LAB — CBC WITH DIFFERENTIAL/PLATELET
Abs Immature Granulocytes: 0.07 10*3/uL (ref 0.00–0.07)
Basophils Absolute: 0 10*3/uL (ref 0.0–0.1)
Basophils Relative: 0 %
Eosinophils Absolute: 0 10*3/uL (ref 0.0–0.5)
Eosinophils Relative: 0 %
HCT: 42.8 % (ref 36.0–46.0)
Hemoglobin: 13.9 g/dL (ref 12.0–15.0)
Immature Granulocytes: 0 %
Lymphocytes Relative: 10 %
Lymphs Abs: 1.8 10*3/uL (ref 0.7–4.0)
MCH: 27.6 pg (ref 26.0–34.0)
MCHC: 32.5 g/dL (ref 30.0–36.0)
MCV: 84.9 fL (ref 80.0–100.0)
Monocytes Absolute: 1 10*3/uL (ref 0.1–1.0)
Monocytes Relative: 5 %
Neutro Abs: 15.6 10*3/uL — ABNORMAL HIGH (ref 1.7–7.7)
Neutrophils Relative %: 85 %
Platelets: 354 10*3/uL (ref 150–400)
RBC: 5.04 MIL/uL (ref 3.87–5.11)
RDW: 12.6 % (ref 11.5–15.5)
WBC: 18.5 10*3/uL — ABNORMAL HIGH (ref 4.0–10.5)
nRBC: 0 % (ref 0.0–0.2)

## 2023-06-27 LAB — URINALYSIS, ROUTINE W REFLEX MICROSCOPIC
Bacteria, UA: NONE SEEN
Bilirubin Urine: NEGATIVE
Glucose, UA: NEGATIVE mg/dL
Hgb urine dipstick: NEGATIVE
Ketones, ur: 5 mg/dL — AB
Nitrite: NEGATIVE
Protein, ur: NEGATIVE mg/dL
Specific Gravity, Urine: 1.021 (ref 1.005–1.030)
pH: 6 (ref 5.0–8.0)

## 2023-06-27 LAB — HCG, QUANTITATIVE, PREGNANCY: hCG, Beta Chain, Quant, S: <1 m[IU]/mL (ref <5)

## 2023-06-27 LAB — LIPASE, BLOOD: Lipase: 30 U/L (ref 11–51)

## 2023-06-27 MED ORDER — PANTOPRAZOLE SODIUM 40 MG IV SOLR
40.0000 mg | Freq: Once | INTRAVENOUS | Status: AC
  Administered 2023-06-27: 40 mg via INTRAVENOUS
  Filled 2023-06-27: qty 10

## 2023-06-27 MED ORDER — IOHEXOL 300 MG/ML  SOLN
100.0000 mL | Freq: Once | INTRAMUSCULAR | Status: AC | PRN
  Administered 2023-06-27: 100 mL via INTRAVENOUS

## 2023-06-27 MED ORDER — ONDANSETRON HCL 4 MG/2ML IJ SOLN
4.0000 mg | Freq: Once | INTRAMUSCULAR | Status: AC
  Administered 2023-06-27: 4 mg via INTRAVENOUS
  Filled 2023-06-27: qty 2

## 2023-06-27 MED ORDER — MORPHINE SULFATE (PF) 4 MG/ML IV SOLN
4.0000 mg | Freq: Once | INTRAVENOUS | Status: AC
  Administered 2023-06-27: 4 mg via INTRAVENOUS
  Filled 2023-06-27: qty 1

## 2023-06-27 MED ORDER — SODIUM CHLORIDE 0.9 % IV BOLUS
500.0000 mL | Freq: Once | INTRAVENOUS | Status: AC
  Administered 2023-06-27: 500 mL via INTRAVENOUS

## 2023-06-27 NOTE — ED Notes (Signed)
Pt given orange juice.

## 2023-06-27 NOTE — Discharge Instructions (Addendum)
You have been seen and discharged from the emergency department.  Your symptoms today seem to be secondary to an IBS flareup.  However there was an incidental finding on your CAT scan of mild right hydronephrosis, possibly caused by an artery crossing over the ureter.  You should follow-up with your primary doctor and/or urology.  Your blood work, kidney function and urine were normal.    Follow-up with your primary provider for further evaluation and further care. Take home medications as prescribed. If you have any worsening symptoms or further concerns for your health please return to an emergency department for further evaluation.

## 2023-06-27 NOTE — ED Provider Notes (Signed)
[place] EMERGENCY DEPARTMENT AT [facility] Provider Note   CSN: [id number] Arrival date & time: 06/27/23  1150     History  Chief Complaint  Patient presents with   Abdominal Pain    Brenda Valdez is a 37 y.o. female.  HPI   37 year old female past medical history of IBS presents emergency department with left-sided abdominal pain.  Patient states that she frequently has IBS exacerbations.  Typically she is able to control with home medications.  However this episode came on quickly and is not responding to oral medication.  She follows with gastroenterology as an outpatient, scheduled for repeat endoscopies in about a month.  She endorses chills and subjective fever.  She has been having nausea with nonbloody vomiting.  States that the diarrhea with this exacerbation usually comes later.  No active diarrhea.  Denies any GU symptoms.  Home Medications Prior to Admission medications   Medication Sig Start Date End Date Taking? Authorizing Provider  albuterol (VENTOLIN HFA) 108 (90 Base) MCG/ACT inhaler Inhale 1-2 puffs into the lungs every 6 (six) hours as needed for wheezing or shortness of breath. 09/09/21   [name], NP  Continuous Blood Gluc Sensor (FREESTYLE LIBRE 3 SENSOR) MISC Place 1 sensor on the skin every 14 days. 09/21/22   [name], NP  cyclobenzaprine (FLEXERIL) 5 MG tablet TAKE 1 TABLET BY MOUTH DAILY AS NEEDED FOR MUSCLE SPASMS. 04/05/23   [name], NP  etonogestrel (NEXPLANON) 68 MG IMPL implant 68 mg by Subdermal route once.    [provider]  glipiZIDE (GLUCOTROL XL) 10 MG 24 hr tablet TAKE 1 TABLET BY MOUTH DAILY WITH BREAKFAST. FOR DIABETES. 03/30/23   [name], NP  hydrOXYzine (ATARAX) 10 MG tablet TAKE 1-2 TABLETS (10-20 MG TOTAL) BY MOUTH AT BEDTIME AS NEEDED. FOR ANXIETY AND SLEEP. 02/02/23   [name], NP  MYDAYIS 37.5 MG CP24 Take 1 capsule by mouth every morning. 08/01/20   [provider]  ondansetron (ZOFRAN ODT) 4 MG disintegrating tablet 4mg  ODT q4 hours prn nausea/vomit 02/09/21   Bethann Berkshire, MD  pantoprazole (PROTONIX) 20 MG tablet Take 1 tablet (20 mg total) by mouth daily. 02/09/21   Bethann Berkshire, MD  rosuvastatin (CRESTOR) 10 MG tablet Take 1 tablet (10 mg total) by mouth daily. 12/09/22   Chandrasekhar, Mahesh A, MD  tirzepatide (MOUNJARO) 7.5 MG/0.5ML Pen INJECT 7.5 MG INTO THE SKIN ONCE A WEEK. FOR DIABETES. 04/13/23   [name], NP  traZODone (DESYREL) 50 MG tablet TAKE 1 TABLET (50 MG TOTAL) BY MOUTH AT BEDTIME. FOR SLEEP 05/30/23   [name], NP  valACYclovir (VALTREX) 500 MG tablet Take 1 tablet (500 mg total) by mouth daily as needed (for outbreaks). 01/23/22   Gweneth Dimitri, MD      Allergies    Doxycycline and Metformin and related    Review of Systems   Review of Systems  Constitutional:  Positive for appetite change and chills. Negative for fever.  Respiratory:  Negative for shortness of breath.   Cardiovascular:  Negative for chest pain.  Gastrointestinal:  Positive for abdominal pain, nausea and vomiting. Negative for diarrhea.  Skin:  Negative for rash.  Neurological:  Negative for headaches.    Physical Exam Updated Vital Signs BP (!) 111/53   Pulse (!) 107   Temp 97.8 F (36.6 C) (Oral)   Resp 18   Ht 5\' 2"  (1.575 m)   Wt 81.6  kg   SpO2 98%   BMI 32.92 kg/m  Physical Exam Vitals and nursing note reviewed.  Constitutional:      General: She is not in acute distress.    Appearance: Normal appearance.  HENT:     Head: Normocephalic.     Mouth/Throat:     Mouth: Mucous membranes are moist.  Cardiovascular:     Rate and Rhythm: Tachycardia present.  Pulmonary:     Effort: Pulmonary effort is normal. No respiratory distress.  Abdominal:     General: Bowel sounds are normal. There is no distension.     Palpations: Abdomen is soft.     Tenderness: There is abdominal tenderness in the periumbilical area  and left lower quadrant. There is no guarding or rebound.  Skin:    General: Skin is warm.  Neurological:     Mental Status: She is alert and oriented to person, place, and time. Mental status is at baseline.  Psychiatric:        Mood and Affect: Mood normal.     ED Results / Procedures / Treatments   Labs (all labs ordered are listed, but only abnormal results are displayed) Labs Reviewed  CBC WITH DIFFERENTIAL/PLATELET - Abnormal; Notable for the following components:      Result Value   WBC 18.5 (*)    Neutro Abs 15.6 (*)    All other components within normal limits  COMPREHENSIVE METABOLIC PANEL  LIPASE, BLOOD  URINALYSIS, ROUTINE W REFLEX MICROSCOPIC  PREGNANCY, URINE  HCG, QUANTITATIVE, PREGNANCY    EKG None  Radiology No results found.  Procedures Procedures    Medications Ordered in ED Medications  sodium chloride 0.9 % bolus 500 mL (has no administration in time range)  ondansetron (ZOFRAN) injection 4 mg (has no administration in time range)  pantoprazole (PROTONIX) injection 40 mg (has no administration in time range)    ED Course/ Medical Decision Making/ A&P                                 Medical Decision Making Amount and/or Complexity of Data Reviewed Labs: ordered. Radiology: ordered.  Risk Prescription drug management.   37 year old female past medical history of IBS presents with what she believes is an IBS flareup.  Mainly complaining of left-sided abdominal pain, nausea/vomiting.  Not improved with her home medications.  On arrival she is tachycardic, tender abdomen to the left but otherwise benign exam.  Blood work shows a leukocytosis of 18, otherwise is reassuring and normal.  Patient not improved with first round of IV medicine.  With ongoing pain and leukocytosis we will plan for CT of the abdomen pelvis.  CT of the abdomen pelvis identifies no emergent finding from an IVF/GI standpoint.  However does show an incidental finding of  mild right hydronephrosis.  This finding has been explained to the patient, she is asymptomatic on that side, normal kidney function, no UTI.  She will follow-up outpatient in regards to this.  On reevaluation patient feels improved, is requesting be discharged home.  Patient at this time appears safe and stable for discharge and close outpatient follow up. Discharge plan and strict return to ED precautions discussed, patient verbalizes understanding and agreement.        Final Clinical Impression(s) / ED Diagnoses Final diagnoses:  None    Rx / DC Orders ED Discharge Orders     None  Rozelle Logan, DO 06/27/23 4034

## 2023-06-27 NOTE — ED Triage Notes (Signed)
C/o sudden onset LUQ abd pain with n/v x2 hours.  Hx IBS and GERD Pt reports Prilosec and Zofran at home w/o relief

## 2023-07-01 ENCOUNTER — Other Ambulatory Visit: Payer: Self-pay | Admitting: Primary Care

## 2023-07-01 DIAGNOSIS — E1165 Type 2 diabetes mellitus with hyperglycemia: Secondary | ICD-10-CM

## 2023-08-31 ENCOUNTER — Other Ambulatory Visit: Payer: Self-pay | Admitting: Primary Care

## 2023-08-31 DIAGNOSIS — F411 Generalized anxiety disorder: Secondary | ICD-10-CM

## 2023-08-31 NOTE — Telephone Encounter (Signed)
Patient is due for CPE/follow up, this will be required prior to any further refills.  Please schedule, thank you!

## 2023-08-31 NOTE — Telephone Encounter (Signed)
LVM for patient to c/b and schedule.

## 2023-09-03 ENCOUNTER — Encounter: Payer: 59 | Admitting: Primary Care

## 2023-09-29 ENCOUNTER — Other Ambulatory Visit: Payer: Self-pay | Admitting: Primary Care

## 2023-09-29 DIAGNOSIS — E1165 Type 2 diabetes mellitus with hyperglycemia: Secondary | ICD-10-CM

## 2024-01-23 ENCOUNTER — Other Ambulatory Visit: Payer: Self-pay | Admitting: Internal Medicine

## 2024-02-17 ENCOUNTER — Ambulatory Visit: Admitting: Nurse Practitioner

## 2024-02-29 ENCOUNTER — Telehealth: Payer: Self-pay | Admitting: Internal Medicine

## 2024-02-29 NOTE — Telephone Encounter (Signed)
*  STAT* If patient is at the pharmacy, call can be transferred to refill team.   1. Which medications need to be refilled? (please list name of each medication and dose if known)   rosuvastatin  (CRESTOR ) 10 MG tablet     4. Which pharmacy/location (including street and city if local pharmacy) is medication to be sent to?  [id number] [address], [place] [phone]     5. Do they need a 30 day or 90 day supply? 90   Scheduled 10/23

## 2024-03-01 ENCOUNTER — Ambulatory Visit: Admitting: Emergency Medicine

## 2024-03-01 MED ORDER — ROSUVASTATIN CALCIUM 10 MG PO TABS: 10.0000 mg | ORAL_TABLET | Freq: Every day | ORAL | 0 refills | Status: DC

## 2024-03-01 NOTE — Telephone Encounter (Signed)
 Rx sent to pharmacy

## 2024-05-25 ENCOUNTER — Ambulatory Visit: Admitting: Internal Medicine

## 2024-06-05 ENCOUNTER — Encounter: Payer: Self-pay | Admitting: Dermatology

## 2024-06-05 ENCOUNTER — Ambulatory Visit: Admitting: Dermatology

## 2024-06-05 VITALS — BP 118/88

## 2024-06-05 DIAGNOSIS — L700 Acne vulgaris: Secondary | ICD-10-CM

## 2024-06-05 DIAGNOSIS — L304 Erythema intertrigo: Secondary | ICD-10-CM | POA: Diagnosis not present

## 2024-06-05 MED ORDER — CLINDAMYCIN PHOSPHATE 1 % EX SWAB: 1.0000 | Freq: Two times a day (BID) | CUTANEOUS | 11 refills | Status: AC

## 2024-06-05 MED ORDER — TRETINOIN 0.025 % EX CREA: TOPICAL_CREAM | CUTANEOUS | 2 refills | Status: AC

## 2024-06-05 MED ORDER — NYSTATIN-TRIAMCINOLONE 100000-0.1 UNIT/GM-% EX OINT: TOPICAL_OINTMENT | CUTANEOUS | 2 refills | Status: AC

## 2024-06-05 NOTE — Patient Instructions (Addendum)
 VISIT SUMMARY:  Today, we discussed your concerns about acne and irritation in your navel area. We reviewed your current skincare routine and made some adjustments to help manage your symptoms more effectively.  YOUR PLAN:  -ACNE VULGARIS:  Acne vulgaris is a common skin condition that causes pimples and inflammation. You have been experiencing flares for about two years.  We have prescribed clindamycin swabs for morning use and tretinoin 0.025% for use 2-3 nights per week.  Continue using your over-the-counter milk cleanser and apply sunscreen after using the clindamycin swabs in the morning.  Moisturize after applying tretinoin at night. Avoid products with benzoyl peroxide or salicylic acid. We may consider spironolactone in the spring if needed.  -INTERTRIGO OF UMBILICUS:  Intertrigo is a rash that occurs in warm, moist areas of the body, often due to yeast overgrowth. You have experienced irritation and itching in your navel area. We have prescribed nystatin triamcinolone ointment to use twice daily during flares for up to two weeks. After two weeks, stop using the ointment and take a break for at least two weeks before resuming. Keep the area dry and use a towel to dry it after showers.  INSTRUCTIONS:  Please follow up if your symptoms do not improve or if you have any concerns. We will consider additional treatments in the spring if necessary.     Important Information  Due to recent changes in healthcare laws, you may see results of your pathology and/or laboratory studies on MyChart before the doctors have had a chance to review them. We understand that in some cases there may be results that are confusing or concerning to you. Please understand that not all results are received at the same time and often the doctors may need to interpret multiple results in order to provide you with the best plan of care or course of treatment. Therefore, we ask that you please give us  2 business  days to thoroughly review all your results before contacting the office for clarification. Should we see a critical lab result, you will be contacted sooner.   If You Need Anything After Your Visit  If you have any questions or concerns for your doctor, please call our main line at [phone] If no one answers, please leave a voicemail as directed and we will return your call as soon as possible. Messages left after 4 pm will be answered the following business day.   You may also send us  a message via MyChart. We typically respond to MyChart messages within 1-2 business days.  For prescription refills, please ask your pharmacy to contact our office. Our fax number is [phone].  If you have an urgent issue when the clinic is closed that cannot wait until the next business day, you can page your doctor at the number below.    Please note that while we do our best to be available for urgent issues outside of office hours, we are not available 24/7.   If you have an urgent issue and are unable to reach us , you may choose to seek medical care at your doctor's office, retail clinic, urgent care center, or emergency room.  If you have a medical emergency, please immediately call 911 or go to the emergency department. In the event of inclement weather, please call our main line at [phone] for an update on the status of any delays or closures.  Dermatology Medication Tips: Please keep the boxes that topical medications come in in order to help keep  track of the instructions about where and how to use these. Pharmacies typically print the medication instructions only on the boxes and not directly on the medication tubes.   If your medication is too expensive, please contact our office at [phone] or send us  a message through MyChart.   We are unable to tell what your co-pay for medications will be in advance as this is different depending on your insurance coverage. However, we may be  able to find a substitute medication at lower cost or fill out paperwork to get insurance to cover a needed medication.   If a prior authorization is required to get your medication covered by your insurance company, please allow us  1-2 business days to complete this process.  Drug prices often vary depending on where the prescription is filled and some pharmacies may offer cheaper prices.  The website www.goodrx.com contains coupons for medications through different pharmacies. The prices here do not account for what the cost may be with help from insurance (it may be cheaper with your insurance), but the website can give you the price if you did not use any insurance.  - You can print the associated coupon and take it with your prescription to the pharmacy.  - You may also stop by our office during regular business hours and pick up a GoodRx coupon card.  - If you need your prescription sent electronically to a different pharmacy, notify our office through [facility] or by phone at [phone]

## 2024-06-05 NOTE — Progress Notes (Signed)
   New Patient Visit   Subjective  Brenda Valdez is a 38 y.o. female who presents for a NEW PATIENT appointment to be examined for the concerns as listed below.   Acne: Pt stated that her acne flared about 2 years ago during a stressful situation that she has not been able to get udder control. She has seen derm before who Rx doxycycline  (allergic reaction), clindamycin & tretinoin (currently using 2x/week). She is currently using an assortment of cleansers, toners, and serums.   Spot Check: Area by the [place] kodak that she previously had Bx around 22-24 that she never received path on. She stated it is itchy and discolored - rating the itch as intermittent but 3 out of 10. She noted at times that it weeps but it is inconsistent. Pt mentioned she has Dx of DM but she has kept her numbers low in addition to weight loss.    Are you nursing, pregnant or trying to conceive? No   The following portions of the chart were reviewed this encounter and updated as appropriate: medications, allergies, medical history  Review of Systems:  No other skin or systemic complaints except as noted in HPI or Assessment and Plan.  Objective  Well appearing patient in no apparent distress; mood and affect are within normal limits.   A focused examination was performed of the following areas: face & naval   Relevant exam findings are noted in the Assessment and Plan.             Assessment & Plan    Acne vulgaris Chronic acne vulgaris with flares for approximately two years. Previous treatment with doxycycline  was discontinued due to an allergic reaction. Currently using clindamycin and tretinoin intermittently. Clinical appearance suggests control with topical treatments.  - Prescribed clindamycin swabs for morning application after washing face. - Prescribed tretinoin 0.025% for application 2-3 nights per week, starting with 3 nights (Monday, Wednesday, Friday) and adjusting based on skin dryness. -  Continue using over-the-counter milk cleanser in the morning and evening. - Apply sunscreen after clindamycin swabs. - Apply moisturizer after tretinoin at night. - Avoid products with benzoyl peroxide or salicylic acid. - Will consider spironolactone in the spring if needed.  Intertrigo  Intermittent irritation in the umbilical area with occasional oozing and itching. Previous biopsy performed. Current examination shows no active inflammation or oozing, only post-inflammatory pigment changes. Diagnosis consistent with intertrigo due to yeast overgrowth and dark sweat.  - Prescribed nystatin triamcinolone ointment for application twice daily during flares for up to two weeks. - Advised to stop ointment after two weeks and take a break for at least two weeks before resuming. - Instructed to keep the area dry and dry with a towel after showers.   No follow-ups on file.   Documentation: I have reviewed the above documentation for accuracy and completeness, and I agree with the above.  I, Shirron Maranda, CMA, am acting as scribe for [employer], DO.   Delon Lenis, DO

## 2024-08-04 ENCOUNTER — Encounter: Payer: Self-pay | Admitting: Internal Medicine

## 2024-08-04 ENCOUNTER — Ambulatory Visit: Attending: Internal Medicine | Admitting: Internal Medicine

## 2024-08-04 VITALS — BP 101/69 | HR 77 | Ht 62.5 in | Wt 148.4 lb

## 2024-08-04 DIAGNOSIS — R079 Chest pain, unspecified: Secondary | ICD-10-CM | POA: Diagnosis not present

## 2024-08-04 DIAGNOSIS — E78019 Familial hypercholesterolemia, unspecified: Secondary | ICD-10-CM | POA: Diagnosis not present

## 2024-08-04 DIAGNOSIS — E1169 Type 2 diabetes mellitus with other specified complication: Secondary | ICD-10-CM

## 2024-08-04 DIAGNOSIS — E785 Hyperlipidemia, unspecified: Secondary | ICD-10-CM

## 2024-08-04 LAB — LDL CHOLESTEROL, DIRECT: LDL Direct: 172 mg/dL — ABNORMAL HIGH (ref 0–99)

## 2024-08-04 MED ORDER — ROSUVASTATIN CALCIUM 10 MG PO TABS: 10.0000 mg | ORAL_TABLET | Freq: Every day | ORAL | 3 refills | Status: AC

## 2024-08-04 NOTE — Patient Instructions (Signed)
 Medication Instructions:  Your physician recommends that you continue on your current medications as directed. Please refer to the Current Medication list given to you today.  *If you need a refill on your cardiac medications before your next appointment, please call your pharmacy*  Lab Work: [facility] at [employer]  If you have labs (blood work) drawn today and your tests are completely normal, you will receive your results only by: MyChart Message (if you have MyChart) OR A paper copy in the mail If you have any lab test that is abnormal or we need to change your treatment, we will call you to review the results.  Testing/Procedures: Your physician has requested that you have an exercise tolerance test. For further information please visit [email or web address] Please also follow instruction sheet, as given  Do not eat, drink or use tobacco products 4 hour prior to the test. Dress prepared to exercise in a comfortable, two piece clothing outfit and walking shoes. Do not wear any perfume, cologne, aftershave or lotion. Bring any current prescription medications with you the day of the test. Notify the office 24 hours in advance if you cannot keep this appointment. If you have any questions please call [phone].    Follow-Up: At [facility], you and your health needs are our priority.  As part of our continuing mission to provide you with exceptional heart care, our providers are all part of one team.  This team includes your primary Cardiologist (physician) and Advanced Practice Providers or APPs (Physician Assistants and Nurse Practitioners) who all work together to provide you with the care you need, when you need it.  Your next appointment:   1 year(s)  Provider:   One of our Advanced Practice Providers (APPs): Morse Clause, PA-C  Lamarr Satterfield, NP Miriam Shams, NP  Olivia Pavy, PA-C Josefa Beauvais, NP  Leontine Salen, PA-C Orren Fabry, PA-C  [place],  PA-C Ernest Dick, NP  Damien Braver, NP Jon Hails, PA-C  Waddell Donath, PA-C    Dayna Taff, PA-C  Scott Weaver, PA-C Lum Louis, NP Katlyn West, NP Callie Goodrich, PA-C  Xika Zhao, NP Sheng Haley, PA-C    Kathleen Johnson, PA-C

## 2024-08-04 NOTE — Progress Notes (Signed)
 " Cardiology Office Note:    Date:  08/04/2024   ID:  Brenda Valdez, DOB 1985-09-17, MRN [id number]  PCP:  Jolee Madelin Patch, MD  [facility] Cardiologist:  Stanly DELENA Leavens, MD  [facility] Electrophysiologist:  None   CC:  FH  History of Present Illness:    Brenda Valdez is a 39 y.o. female with a hx of T2DM with HLD who presents for evaluation 09/02/20. In interim of this visit, patient negative echo and stress echo.  Did not wish to start statin in 2023. 2023: missed appt. 2024: Lpa 217  Brenda Valdez, with hyperlipidemia and diabetes who presents with intermittent chest pain and shortness of breath.  She experiences intermittent chest pain accompanied by shortness of breath. The episodes are short-lived, resolving with rest, and are not reproducible with activity. She cannot identify specific triggers and has not experienced these symptoms for a few weeks. No current chest pain or shortness of breath.  She has a history of hyperlipidemia and diabetes. Her lipoprotein(a) level was previously elevated at 217. She has been managing her cholesterol levels and has lost approximately 30 to 40 pounds since April, which she attributes to the use of Mounjaro  for two years and the addition of Adderall since April. She is currently taking Mounjaro  and Adderall, but did not specify the doses or frequency.  In 2022, she underwent a stress test and stress echocardiogram, which were normal. She has a family history of coronary artery disease and high cholesterol, but no confirmed family history of sudden cardiac death. Initially, she thought there was a family history of sudden cardiac death, but clarified it was her best friend's sister who passed away suddenly.  Past Medical History:  Diagnosis Date   ADHD    Asthma    excercise induced   Chest discomfort 09/10/2017   Chiari I malformation (HCC)    COVID-19 02/25/2022   COVID-19 virus infection 09/11/2021   Decreased fetal movement  during pregnancy in third trimester, antepartum 12/05/2014   Decreased fetal movement in pregnancy in third trimester, antepartum 12/23/2014   Diabetes mellitus without complication (HCC)    Hyperlipidemia    IBS (irritable bowel syndrome)    Internal hemorrhoids    noted on colonoscopy from 2020   No pertinent past medical history    Vaginal Pap smear, abnormal     Past Surgical History:  Procedure Laterality Date   CESAREAN SECTION N/A 12/25/2014   Procedure: CESAREAN SECTION;  Surgeon: Robbi Render, MD;  Location: [facility];  Service: Obstetrics;  Laterality: N/A;   LAPAROSCOPY  03/24/2011   Procedure: LAPAROSCOPY DIAGNOSTIC;  Surgeon: Charlie CHRISTELLA Croak;  Location: [facility];  Service: Gynecology;  Laterality: N/A;   LASER ABLATION CONDOLAMATA N/A 06/16/2013   Procedure:  CO2 LASER ABLATION CONDOLAMATA;  Surgeon: Charlie CHRISTELLA Croak, MD;  Location: [facility];  Service: Gynecology;  Laterality: N/A;  CO2 LASER ABLATION OF VAIN & CIN   LEEP  08/11/2012   Procedure: LOOP ELECTROSURGICAL EXCISION PROCEDURE (LEEP);  Surgeon: Charlie CHRISTELLA Croak, MD;  Location: [facility];  Service: Gynecology;  Laterality: N/A;   WISDOM TOOTH EXTRACTION      Current Medications: Current Meds  Medication Sig   albuterol  (VENTOLIN  HFA) 108 (90 Base) MCG/ACT inhaler Inhale 1-2 puffs into the lungs every 6 (six) hours as needed for wheezing or shortness of breath.   clindamycin  (CLEOCIN  T) 1 % SWAB Apply 1 Application topically 2 (two) times daily.   Continuous Blood Gluc Sensor (  FREESTYLE LIBRE 3 SENSOR) MISC Place 1 sensor on the skin every 14 days.   cyclobenzaprine  (FLEXERIL ) 5 MG tablet TAKE 1 TABLET BY MOUTH DAILY AS NEEDED FOR MUSCLE SPASMS.   etonogestrel (NEXPLANON) 68 MG IMPL implant 68 mg by Subdermal route once.   hydrOXYzine  (ATARAX ) 10 MG tablet TAKE 1-2 TABLETS (10-20 MG TOTAL) BY MOUTH AT BEDTIME AS NEEDED. FOR ANXIETY AND SLEEP.   MYDAYIS 37.5 MG CP24 Take 1 capsule by mouth every morning.    nystatin -triamcinolone  ointment (MYCOLOG) apply to areas BID for 2 weeks on, 2 weeks off.   ondansetron  (ZOFRAN  ODT) 4 MG disintegrating tablet 4mg  ODT q4 hours prn nausea/vomit   rosuvastatin  (CRESTOR ) 10 MG tablet Take 1 tablet (10 mg total) by mouth daily.   tirzepatide  (MOUNJARO ) 7.5 MG/0.5ML Pen INJECT 7.5 MG INTO THE SKIN ONCE A WEEK. FOR DIABETES.   traZODone  (DESYREL ) 50 MG tablet TAKE 1 TABLET (50 MG TOTAL) BY MOUTH AT BEDTIME. FOR SLEEP   tretinoin  (RETIN-A ) 0.025 % cream Apply pea size amount (1gm) to face 2-3 nights a week   valACYclovir  (VALTREX ) 500 MG tablet Take 1 tablet (500 mg total) by mouth daily as needed (for outbreaks).     Allergies:   Doxycycline  and Metformin  and related   Social History   Socioeconomic History   Marital status: Single    Spouse name: Not on file   Number of children: 1   Years of education: BA   Highest education level: Bachelor's degree (e.g., BA, AB, BS)  Occupational History   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[place]  Tobacco Use   Smoking status: Never   Smokeless tobacco: Never  Vaping Use   Vaping status: Never Used  Substance and Sexual Activity   Alcohol use: No    Comment: socially   Drug use: No   Sexual activity: Yes    Birth control/protection: None  Other Topics Concern   Not on file  Social History Narrative   Lives with mother and daughter   Caffeine use: Drinks 1/2 cup per day   Right handed    Social Drivers of Health   Tobacco Use: Low Risk (08/04/2024)   Patient History    Smoking Tobacco Use: Never    Smokeless Tobacco Use: Never    Passive Exposure: Not on file  Financial Resource Strain: Low Risk (06/01/2024)   Received from [facility]   Overall Financial Resource Strain (CARDIA)    How hard is it for you to pay for the very basics like food, housing, medical care, and heating?: Not very hard  Food Insecurity: No Food Insecurity (06/01/2024)   [facility]   Epic    Within the past 12  months, you worried that your food would run out before you got the money to buy more.: Never true    Within the past 12 months, the food you bought just didn't last and you didn't have money to get more.: Never true  Transportation Needs: No Transportation Needs (06/01/2024)   Received from [facility]    In the past 12 months, has lack of transportation kept you from medical appointments or from getting medications?: No    In the past 12 months, has lack of transportation kept you from meetings, work, or from getting things needed for daily living?: No  Physical Activity: Inactive (06/01/2024)   Received from [facility]   Exercise Vital Sign    On average, how many days per week do  you engage in moderate to strenuous exercise (like a brisk walk)?: 0 days    Minutes of Exercise per Session: Not on file  Stress: No Stress Concern Present (06/01/2024)   Received from [facility] - Occupational Stress Questionnaire    Do you feel stress - tense, restless, nervous, or anxious, or unable to sleep at night because your mind is troubled all the time - these days?: Not at all  Social Connections: Socially Integrated (06/01/2024)   Received from [facility]   Social Network    How would you rate your social network (family, work, friends)?: Good participation with social networks  Depression (PHQ2-9): Medium Risk (03/03/2023)   Depression (PHQ2-9)    PHQ-2 Score: 5  Alcohol Screen: Low Risk (03/02/2023)   Alcohol Screen    Last Alcohol Screening Score (AUDIT): 3  Housing: Low Risk (06/01/2024)   Received from [facility]    In the last 12 months, was there a time when you were not able to pay the mortgage or rent on time?: No    In the past 12 months, how many times have you moved where you were living?: 0    At any time in the past 12 months, were you homeless or living in a shelter (including now)?: No  Utilities: Not At Risk  (06/01/2024)   Received from [facility]    In the past 12 months has the electric, gas, oil, or water company threatened to shut off services in your home?: No  Health Literacy: Not on file    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  Family History: The patient's family history includes Anxiety disorder in her mother; Arthritis in her mother; Asthma in her mother; COPD in her mother; Cancer in her mother; Dementia in her mother; Depression in her mother; Diabetes in her father; Hyperlipidemia in her father; Hypertension in her father and mother; Kidney disease in her father; Stroke in her mother; Vision loss in her father. History of coronary artery disease notable for father, maternal aunt. History of heart failure notable for no members. No history of cardiomyopathies including hypertrophic cardiomyopathy, left ventricular non-compaction, or arrhythmogenic right ventricular cardiomyopathy History of arrhythmia notable for no members. Family history of sudden cardiac death in uncle and cousin (cousin died suddenly on the basketball court) No history of bicuspid aortic valve or aortic aneurysm or dissection.  ROS:   Please see the history of present illness.     EKGs/Labs/Other Studies Reviewed:      Transthoracic Echocardiogram: Date: 10/21/20 Results: 1. Left ventricular ejection fraction, by estimation, is 55 to 60%. Left  ventricular ejection fraction by 3D volume is 65 %. The left ventricle has  normal function. The left ventricle has no regional wall motion  abnormalities. Left ventricular diastolic   parameters were normal.   2. Right ventricular systolic function is normal. The right ventricular  size is normal.   3. The mitral valve is normal in structure. Trivial mitral valve  regurgitation. No evidence of mitral stenosis.   4. The aortic valve is normal in structure. Aortic valve regurgitation is  not visualized. No aortic stenosis is  present.   5. The inferior vena cava is normal in size with greater than 50%  respiratory variability, suggesting right atrial pressure of 3 mmHg.   Stress Echo: Date: 10/21/20 Results: 2D Echo Findings: Baseline  regional wall motion abnormalities were not  present. There were no stress-induced wall motion abnormalities. This is a  negative stress echocardiogram for ischemia.    1. This is a negative stress echocardiogram for ischemia.   2. This is a low risk study.   Recent Labs: No results found for requested labs within last 365 days.  Recent Lipid Panel    Component Value Date/Time   CHOL 141 03/10/2023 0818   TRIG 120 03/10/2023 0818   HDL 30 (L) 03/10/2023 0818   CHOLHDL 4.7 (H) 03/10/2023 0818   CHOLHDL 6 09/02/2022 1011   VLDL 18.8 09/02/2022 1011   LDLCALC 89 03/10/2023 0818     Physical Exam:    VS:  BP 101/69   Pulse 77   Ht 5' 2.5 (1.588 m)   Wt 148 lb 6.4 oz (67.3 kg)   SpO2 99%   BMI 26.71 kg/m     Wt Readings from Last 3 Encounters:  08/04/24 148 lb 6.4 oz (67.3 kg)  06/27/23 180 lb (81.6 kg)  03/03/23 182 lb (82.6 kg)   THIS IS EXAM FROM LAST VISIT.  PATIENT DID NOT SHOW UP.  GEN:  Well nourished, well developed in no acute distress HEENT: Normal NECK: No JVD; No carotid bruits LYMPHATICS: No lymphadenopathy CARDIAC: RRR, II /IV systolic ejection murmur no rubs or gallops RESPIRATORY:  Clear to auscultation without rales, wheezing or rhonchi  ABDOMEN: Soft, non-tender, non-distended MUSCULOSKELETAL:  No edema; No deformity  SKIN: Warm and dry NEUROLOGIC:  Alert and oriented x 3 PSYCHIATRIC:  Normal affect    ASSESSMENT/PLAN:    Atypical chest pain Intermittent chest pain with shortness of breath, resolving with rest, not reproducible with activity. No recent episodes in the past few weeks. Previous stress test and stress echo in 2022 were normal. Family history of coronary artery disease. No family history of sudden cardiac death  confirmed. - Ordered traditional stress test - If stress test is inconclusive, will order CT scan with single bead acquisition to minimize radiation exposure - she clarifies that SCD was a family friend but not a blood relative  Familial hypercholesterolemia with elevated lipoprotein(a) Elevated lipoprotein(a) at 217. Weight loss of 40 pounds since April. Currently on Mounjaro  and Adderall. No current treatment for lipoprotein(a) as she is too healthy for clinical trials. Elevated lipoprotein(a) indicates need for aggressive management of other risk factors. - Checked cholesterol levels today - Did not recheck lipoprotein(a) as no current treatment available  Type 2 diabetes mellitus - on GLP1 RA therapy, doing great  Stanly Leavens, MD FASE [facility] Cardiologist [facility]  [address] [place], [place] [address] [phone]  9:19 AM  "

## 2024-08-06 ENCOUNTER — Encounter: Payer: Self-pay | Admitting: Internal Medicine

## 2024-08-06 DIAGNOSIS — E1169 Type 2 diabetes mellitus with other specified complication: Secondary | ICD-10-CM

## 2024-08-06 DIAGNOSIS — E78019 Familial hypercholesterolemia, unspecified: Secondary | ICD-10-CM

## 2024-08-09 ENCOUNTER — Other Ambulatory Visit (HOSPITAL_COMMUNITY): Payer: Self-pay

## 2024-08-09 ENCOUNTER — Telehealth: Payer: Self-pay | Admitting: Pharmacy Technician

## 2024-08-09 NOTE — Telephone Encounter (Signed)
 Pharmacy Patient Advocate Encounter   Received notification from Physician's Office that prior authorization for repatha is required/requested.   Insurance verification completed.   The patient is insured through [facility].   Per test claim: The current 08/09/24 day co-pay is, $60.92- one month.  No PA needed at this time. This test claim was processed through [facility]- copay amounts may vary at other pharmacies due to pharmacy/plan contracts, or as the patient moves through the different stages of their insurance plan.

## 2024-08-10 MED ORDER — REPATHA SURECLICK 140 MG/ML ~~LOC~~ SOAJ: 1.0000 mL | SUBCUTANEOUS | 5 refills | Status: AC

## 2024-08-10 NOTE — Addendum Note (Signed)
 Addended by: DARRELL BAREFOOT on: 08/10/2024 07:52 AM   Modules accepted: Orders

## 2024-08-18 ENCOUNTER — Encounter: Payer: Self-pay | Admitting: Internal Medicine

## 2024-08-18 ENCOUNTER — Telehealth (HOSPITAL_COMMUNITY): Payer: Self-pay | Admitting: *Deleted

## 2024-08-18 DIAGNOSIS — E78019 Familial hypercholesterolemia, unspecified: Secondary | ICD-10-CM

## 2024-08-18 DIAGNOSIS — E1169 Type 2 diabetes mellitus with other specified complication: Secondary | ICD-10-CM

## 2024-08-18 NOTE — Telephone Encounter (Signed)
 Reminder call with instructions given for upcoming GXT on 08/25/24 at 3:45

## 2024-08-25 ENCOUNTER — Ambulatory Visit (HOSPITAL_COMMUNITY)

## 2024-09-18 ENCOUNTER — Ambulatory Visit (HOSPITAL_COMMUNITY)

## 2024-10-06 ENCOUNTER — Ambulatory Visit

## 2024-11-20 ENCOUNTER — Ambulatory Visit: Admitting: Dermatology
